# Patient Record
Sex: Female | Born: 1965 | Race: White | Hispanic: No | Smoking: Former smoker
Health system: Southern US, Community
[De-identification: ages and names within clinical notes are randomized; demographics above are authoritative.]

## PROBLEM LIST (undated history)

## (undated) DIAGNOSIS — K219 Gastro-esophageal reflux disease without esophagitis: Secondary | ICD-10-CM

## (undated) DIAGNOSIS — F1010 Alcohol abuse, uncomplicated: Secondary | ICD-10-CM

## (undated) DIAGNOSIS — D259 Leiomyoma of uterus, unspecified: Secondary | ICD-10-CM

## (undated) DIAGNOSIS — E7800 Pure hypercholesterolemia, unspecified: Secondary | ICD-10-CM

## (undated) DIAGNOSIS — K920 Hematemesis: Secondary | ICD-10-CM

## (undated) DIAGNOSIS — M179 Osteoarthritis of knee, unspecified: Secondary | ICD-10-CM

## (undated) DIAGNOSIS — E109 Type 1 diabetes mellitus without complications: Secondary | ICD-10-CM

## (undated) DIAGNOSIS — B019 Varicella without complication: Secondary | ICD-10-CM

## (undated) DIAGNOSIS — F32A Depression, unspecified: Secondary | ICD-10-CM

## (undated) DIAGNOSIS — G4730 Sleep apnea, unspecified: Secondary | ICD-10-CM

## (undated) DIAGNOSIS — I10 Essential (primary) hypertension: Secondary | ICD-10-CM

## (undated) DIAGNOSIS — R002 Palpitations: Secondary | ICD-10-CM

## (undated) DIAGNOSIS — K589 Irritable bowel syndrome without diarrhea: Secondary | ICD-10-CM

## (undated) DIAGNOSIS — M1710 Unilateral primary osteoarthritis, unspecified knee: Secondary | ICD-10-CM

## (undated) DIAGNOSIS — F329 Major depressive disorder, single episode, unspecified: Secondary | ICD-10-CM

## (undated) DIAGNOSIS — F419 Anxiety disorder, unspecified: Secondary | ICD-10-CM

## (undated) DIAGNOSIS — K5900 Constipation, unspecified: Secondary | ICD-10-CM

## (undated) DIAGNOSIS — G4700 Insomnia, unspecified: Secondary | ICD-10-CM

## (undated) DIAGNOSIS — D649 Anemia, unspecified: Secondary | ICD-10-CM

## (undated) DIAGNOSIS — K449 Diaphragmatic hernia without obstruction or gangrene: Secondary | ICD-10-CM

## (undated) DIAGNOSIS — K649 Unspecified hemorrhoids: Secondary | ICD-10-CM

## (undated) DIAGNOSIS — K3184 Gastroparesis: Secondary | ICD-10-CM

## (undated) DIAGNOSIS — T7840XA Allergy, unspecified, initial encounter: Secondary | ICD-10-CM

## (undated) DIAGNOSIS — F909 Attention-deficit hyperactivity disorder, unspecified type: Secondary | ICD-10-CM

## (undated) DIAGNOSIS — M25519 Pain in unspecified shoulder: Secondary | ICD-10-CM

## (undated) DIAGNOSIS — F429 Obsessive-compulsive disorder, unspecified: Secondary | ICD-10-CM

## (undated) DIAGNOSIS — D239 Other benign neoplasm of skin, unspecified: Secondary | ICD-10-CM

## (undated) DIAGNOSIS — F319 Bipolar disorder, unspecified: Secondary | ICD-10-CM

## (undated) DIAGNOSIS — F17200 Nicotine dependence, unspecified, uncomplicated: Secondary | ICD-10-CM

## (undated) HISTORY — DX: Anemia, unspecified: D64.9

## (undated) HISTORY — DX: Other benign neoplasm of skin, unspecified: D23.9

## (undated) HISTORY — DX: Irritable bowel syndrome, unspecified: K58.9

## (undated) HISTORY — DX: Anxiety disorder, unspecified: F41.9

## (undated) HISTORY — DX: Essential (primary) hypertension: I10

## (undated) HISTORY — DX: Sleep apnea, unspecified: G47.30

## (undated) HISTORY — PX: UPPER GASTROINTESTINAL ENDOSCOPY: SHX188

## (undated) HISTORY — DX: Diaphragmatic hernia without obstruction or gangrene: K44.9

## (undated) HISTORY — DX: Gastro-esophageal reflux disease without esophagitis: K21.9

## (undated) HISTORY — DX: Leiomyoma of uterus, unspecified: D25.9

## (undated) HISTORY — DX: Unspecified hemorrhoids: K64.9

## (undated) HISTORY — DX: Gastroparesis: K31.84

## (undated) HISTORY — DX: Hematemesis: K92.0

## (undated) HISTORY — DX: Allergy, unspecified, initial encounter: T78.40XA

## (undated) HISTORY — DX: Nicotine dependence, unspecified, uncomplicated: F17.200

## (undated) HISTORY — DX: Varicella without complication: B01.9

## (undated) HISTORY — PX: CARPAL TUNNEL RELEASE: SHX101

## (undated) HISTORY — DX: Pain in unspecified shoulder: M25.519

## (undated) HISTORY — DX: Palpitations: R00.2

## (undated) HISTORY — DX: Insomnia, unspecified: G47.00

## (undated) HISTORY — PX: COLONOSCOPY: SHX174

## (undated) HISTORY — DX: Alcohol abuse, uncomplicated: F10.10

## (undated) HISTORY — DX: Pure hypercholesterolemia, unspecified: E78.00

## (undated) HISTORY — DX: Obsessive-compulsive disorder, unspecified: F42.9

## (undated) HISTORY — DX: Attention-deficit hyperactivity disorder, unspecified type: F90.9

---

## 2000-09-15 ENCOUNTER — Encounter: Payer: Self-pay | Admitting: Emergency Medicine

## 2000-09-15 ENCOUNTER — Emergency Department (HOSPITAL_COMMUNITY): Admission: EM | Admit: 2000-09-15 | Discharge: 2000-09-15 | Payer: Self-pay | Admitting: Emergency Medicine

## 2000-09-16 ENCOUNTER — Encounter: Payer: Self-pay | Admitting: Neurology

## 2000-09-16 ENCOUNTER — Ambulatory Visit (HOSPITAL_COMMUNITY): Admission: RE | Admit: 2000-09-16 | Discharge: 2000-09-16 | Payer: Self-pay | Admitting: Neurology

## 2001-06-02 ENCOUNTER — Encounter: Payer: Self-pay | Admitting: Family Medicine

## 2001-06-02 ENCOUNTER — Encounter: Admission: RE | Admit: 2001-06-02 | Discharge: 2001-06-02 | Payer: Self-pay | Admitting: Family Medicine

## 2002-02-08 HISTORY — PX: OTHER SURGICAL HISTORY: SHX169

## 2006-02-08 DIAGNOSIS — E109 Type 1 diabetes mellitus without complications: Secondary | ICD-10-CM

## 2006-02-08 HISTORY — DX: Type 1 diabetes mellitus without complications: E10.9

## 2007-05-22 ENCOUNTER — Ambulatory Visit: Payer: Self-pay | Admitting: Family Medicine

## 2007-06-16 ENCOUNTER — Ambulatory Visit: Payer: Self-pay | Admitting: Pulmonary Disease

## 2007-06-16 DIAGNOSIS — R05 Cough: Secondary | ICD-10-CM

## 2007-06-16 DIAGNOSIS — J309 Allergic rhinitis, unspecified: Secondary | ICD-10-CM | POA: Insufficient documentation

## 2007-07-28 ENCOUNTER — Encounter: Payer: Self-pay | Admitting: Gastroenterology

## 2007-08-14 DIAGNOSIS — I10 Essential (primary) hypertension: Secondary | ICD-10-CM

## 2007-08-14 DIAGNOSIS — F418 Other specified anxiety disorders: Secondary | ICD-10-CM | POA: Insufficient documentation

## 2007-08-14 DIAGNOSIS — E1159 Type 2 diabetes mellitus with other circulatory complications: Secondary | ICD-10-CM | POA: Insufficient documentation

## 2007-08-14 DIAGNOSIS — F1010 Alcohol abuse, uncomplicated: Secondary | ICD-10-CM | POA: Insufficient documentation

## 2007-08-14 DIAGNOSIS — K219 Gastro-esophageal reflux disease without esophagitis: Secondary | ICD-10-CM

## 2007-08-14 DIAGNOSIS — F411 Generalized anxiety disorder: Secondary | ICD-10-CM

## 2007-08-14 DIAGNOSIS — F429 Obsessive-compulsive disorder, unspecified: Secondary | ICD-10-CM

## 2007-08-14 DIAGNOSIS — M25519 Pain in unspecified shoulder: Secondary | ICD-10-CM

## 2007-08-14 DIAGNOSIS — F17200 Nicotine dependence, unspecified, uncomplicated: Secondary | ICD-10-CM | POA: Insufficient documentation

## 2007-08-14 DIAGNOSIS — E7800 Pure hypercholesterolemia, unspecified: Secondary | ICD-10-CM | POA: Insufficient documentation

## 2007-08-15 ENCOUNTER — Ambulatory Visit: Payer: Self-pay | Admitting: Gastroenterology

## 2007-08-15 DIAGNOSIS — K3184 Gastroparesis: Secondary | ICD-10-CM | POA: Insufficient documentation

## 2007-08-15 DIAGNOSIS — K589 Irritable bowel syndrome without diarrhea: Secondary | ICD-10-CM | POA: Insufficient documentation

## 2007-12-07 ENCOUNTER — Ambulatory Visit: Payer: Self-pay | Admitting: Family Medicine

## 2008-07-12 ENCOUNTER — Ambulatory Visit: Payer: Self-pay | Admitting: Family Medicine

## 2008-07-16 ENCOUNTER — Ambulatory Visit (HOSPITAL_BASED_OUTPATIENT_CLINIC_OR_DEPARTMENT_OTHER): Admission: RE | Admit: 2008-07-16 | Discharge: 2008-07-16 | Payer: Self-pay | Admitting: Family Medicine

## 2008-07-21 ENCOUNTER — Ambulatory Visit: Payer: Self-pay | Admitting: Internal Medicine

## 2008-11-01 ENCOUNTER — Encounter: Admission: RE | Admit: 2008-11-01 | Discharge: 2008-11-01 | Payer: Self-pay | Admitting: Family Medicine

## 2009-05-17 ENCOUNTER — Encounter: Admission: RE | Admit: 2009-05-17 | Discharge: 2009-05-17 | Payer: Self-pay | Admitting: Obstetrics and Gynecology

## 2009-06-23 ENCOUNTER — Encounter: Admission: RE | Admit: 2009-06-23 | Discharge: 2009-06-23 | Payer: Self-pay | Admitting: Family Medicine

## 2009-06-26 ENCOUNTER — Encounter: Admission: RE | Admit: 2009-06-26 | Discharge: 2009-06-26 | Payer: Self-pay | Admitting: Family Medicine

## 2009-07-09 HISTORY — PX: VAGINAL HYSTERECTOMY: SUR661

## 2009-07-10 ENCOUNTER — Ambulatory Visit (HOSPITAL_COMMUNITY): Admission: RE | Admit: 2009-07-10 | Discharge: 2009-07-11 | Payer: Self-pay | Admitting: Obstetrics and Gynecology

## 2009-07-10 ENCOUNTER — Encounter (INDEPENDENT_AMBULATORY_CARE_PROVIDER_SITE_OTHER): Payer: Self-pay | Admitting: Obstetrics and Gynecology

## 2010-02-08 LAB — HM COLONOSCOPY

## 2010-03-01 ENCOUNTER — Encounter: Payer: Self-pay | Admitting: Family Medicine

## 2010-03-02 ENCOUNTER — Encounter: Payer: Self-pay | Admitting: Gastroenterology

## 2010-03-20 ENCOUNTER — Encounter: Payer: Self-pay | Admitting: Gastroenterology

## 2010-03-27 DIAGNOSIS — D259 Leiomyoma of uterus, unspecified: Secondary | ICD-10-CM | POA: Insufficient documentation

## 2010-03-27 DIAGNOSIS — D235 Other benign neoplasm of skin of trunk: Secondary | ICD-10-CM | POA: Insufficient documentation

## 2010-03-27 DIAGNOSIS — G4730 Sleep apnea, unspecified: Secondary | ICD-10-CM | POA: Insufficient documentation

## 2010-03-27 DIAGNOSIS — F1010 Alcohol abuse, uncomplicated: Secondary | ICD-10-CM | POA: Insufficient documentation

## 2010-03-27 DIAGNOSIS — D509 Iron deficiency anemia, unspecified: Secondary | ICD-10-CM | POA: Insufficient documentation

## 2010-03-31 ENCOUNTER — Telehealth: Payer: Self-pay | Admitting: Gastroenterology

## 2010-03-31 ENCOUNTER — Ambulatory Visit (INDEPENDENT_AMBULATORY_CARE_PROVIDER_SITE_OTHER): Payer: Self-pay | Admitting: Gastroenterology

## 2010-03-31 ENCOUNTER — Encounter: Payer: Self-pay | Admitting: Gastroenterology

## 2010-03-31 ENCOUNTER — Other Ambulatory Visit: Payer: Self-pay

## 2010-03-31 ENCOUNTER — Encounter (INDEPENDENT_AMBULATORY_CARE_PROVIDER_SITE_OTHER): Payer: Self-pay | Admitting: *Deleted

## 2010-03-31 ENCOUNTER — Other Ambulatory Visit: Payer: Self-pay | Admitting: Gastroenterology

## 2010-03-31 DIAGNOSIS — E119 Type 2 diabetes mellitus without complications: Secondary | ICD-10-CM

## 2010-03-31 DIAGNOSIS — K219 Gastro-esophageal reflux disease without esophagitis: Secondary | ICD-10-CM

## 2010-03-31 DIAGNOSIS — K3184 Gastroparesis: Secondary | ICD-10-CM

## 2010-03-31 DIAGNOSIS — K5900 Constipation, unspecified: Secondary | ICD-10-CM

## 2010-03-31 DIAGNOSIS — E109 Type 1 diabetes mellitus without complications: Secondary | ICD-10-CM

## 2010-03-31 DIAGNOSIS — K589 Irritable bowel syndrome without diarrhea: Secondary | ICD-10-CM

## 2010-03-31 LAB — CBC WITH DIFFERENTIAL/PLATELET
Basophils Absolute: 0 10*3/uL (ref 0.0–0.1)
Monocytes Absolute: 0.5 10*3/uL (ref 0.1–1.0)
Neutrophils Relative %: 71 % (ref 43.0–77.0)
RBC: 4.68 Mil/uL (ref 3.87–5.11)
WBC: 8.9 10*3/uL (ref 4.5–10.5)

## 2010-04-01 LAB — LIPASE: Lipase: 19 U/L (ref 11.0–59.0)

## 2010-04-01 LAB — AMYLASE: Amylase: 70 U/L (ref 27–131)

## 2010-04-01 LAB — IGA: IgA: 205 mg/dL (ref 68–378)

## 2010-04-07 NOTE — Progress Notes (Signed)
Summary: Wrong Rx  Phone Note Call from Patient Call back at Home Phone [phone] [facility]     Call For: Dr Jarold Motto Summary of Call: We sent script to [facility] pharmacy. [facility] [address] [facility] there. Adviced her nurse is at lunch ad will take care as soon as she gets back. Initial call taken by: Leanor Kail [facility],  March 31, 2010 1:14 PM  Follow-up for Phone Call        resent rx to new pharm. unble to contact pt at the number listed. Follow-up by: Harlow Mares CMA Duncan Dull),  March 31, 2010 1:22 PM    Prescriptions: MOVIPREP 100 GM  SOLR (PEG-KCL-NACL-NASULF-NA ASC-C) As per prep instructions.  #1 x 0   Entered by:   Harlow Mares CMA (AAMA)   Authorized by:   Mardella Layman MD [facility]   Signed by:   Harlow Mares CMA (AAMA) on 03/31/2010   Method used:   [facility] [phone]* (retail)       [address]       [place], [place]  [address]       Ph: [phone] or [phone]       Fax: [phone]   RxID:   [id number] CARAFATE 1 GM/10ML SUSP (SUCRALFATE) take 10 ml 20 min after meals and at bedtime  #1 month x 3   Entered by:   Harlow Mares CMA (AAMA)   Authorized by:   Mardella Layman MD [facility]   Signed by:   Harlow Mares CMA (AAMA) on 03/31/2010   Method used:   [facility] [phone]* (retail)       [address]       [place], [place]  [address]       Ph: [phone] or [phone]       Fax: [phone]   RxID:   [id number]

## 2010-04-07 NOTE — Letter (Signed)
Summary: [facility]   Imported By: Lester [place] 04/02/2010 09:29:50  _____________________________________________________________________  External Attachment:    Type:   Image     Comment:   External Document

## 2010-04-07 NOTE — Letter (Signed)
Summary: Diabetic Instructions  [facility]  [address] [place], [place] [address]   Phone: [phone]  Fax: [phone]    CHARITY TESSIER 1965-09-04 MRN: [id number]   X   INSULIN PUMP MEDICATION INSTRUCTIONS  We will contact the physician managing your diabetic care for written dosage instructions for the day before your procedure and the day of your procedure.  Once we have received the instructions, we will contact you.

## 2010-04-07 NOTE — Letter (Signed)
Summary: [facility] Instructions  [facility]  [address] [place], [place] [address]   Phone: [phone]  Fax: [phone]       Karla Rodriguez    05-Nov-1965    MRN: [id number]        Procedure Day Dorna Bloom: Wednesday 05/06/2010     Arrival Time: 9:30am     Procedure Time: 10:30am     Location of Procedure:                    X  [facility] (4th Floor)                        PREPARATION FOR COLONOSCOPY WITH MOVIPREP   Starting 5 days prior to your procedure 05/01/2010 do not eat nuts, seeds, popcorn, corn, beans, peas,  salads, or any raw vegetables.  Do not take any fiber supplements (e.g. Metamucil, Citrucel, and Benefiber).  THE DAY BEFORE YOUR PROCEDURE         Tuesday 05/05/2010  1.  Drink clear liquids the entire day-NO SOLID FOOD  2.  Do not drink anything colored red or purple.  Avoid juices with pulp.  No orange juice.  3.  Drink at least 64 oz. (8 glasses) of fluid/clear liquids during the day to prevent dehydration and help the prep work efficiently.  CLEAR LIQUIDS INCLUDE: Water Jello Ice Popsicles Tea (sugar ok, no milk/cream) Powdered fruit flavored drinks Coffee (sugar ok, no milk/cream) Gatorade Juice: apple, white grape, white cranberry  Lemonade Clear bullion, consomm, broth Carbonated beverages (any kind) Strained chicken noodle soup Hard Candy                             4.  In the morning, mix first dose of MoviPrep solution:    Empty 1 Pouch A and 1 Pouch B into the disposable container    Add lukewarm drinking water to the top line of the container. Mix to dissolve    Refrigerate (mixed solution should be used within 24 hrs)  5.  Begin drinking the prep at 5:00 p.m. The MoviPrep container is divided by 4 marks.   Every 15 minutes drink the solution down to the next mark (approximately 8 oz) until the full liter is complete.   6.  Follow completed prep with 16 oz of clear liquid of your choice (Nothing red or  purple).  Continue to drink clear liquids until bedtime.  7.  Before going to bed, mix second dose of MoviPrep solution:    Empty 1 Pouch A and 1 Pouch B into the disposable container    Add lukewarm drinking water to the top line of the container. Mix to dissolve    Refrigerate  THE DAY OF YOUR PROCEDURE      Tuesday 05/06/2010  Beginning at 5:30am (5 hours before procedure):         1. Every 15 minutes, drink the solution down to the next mark (approx 8 oz) until the full liter is complete.  2. Follow completed prep with 16 oz. of clear liquid of your choice.    3. You may drink clear liquids until 8:30am (2 HOURS BEFORE PROCEDURE).   MEDICATION INSTRUCTIONS  Unless otherwise instructed, you should take regular prescription medications with a small sip of water   as early as possible the morning of your procedure.  Diabetic patients - see separate instructions.  OTHER INSTRUCTIONS  You will need a responsible adult at least 43 years of age to accompany you and drive you home.   This person must remain in the waiting room during your procedure.  Wear loose fitting clothing that is easily removed.  Leave jewelry and other valuables at home.  However, you may wish to bring a book to read or  an iPod/MP3 player to listen to music as you wait for your procedure to start.  Remove all body piercing jewelry and leave at home.  Total time from sign-in until discharge is approximately 2-3 hours.  You should go home directly after your procedure and rest.  You can resume normal activities the  day after your procedure.  The day of your procedure you should not:   Drive   Make legal decisions   Operate machinery   Drink alcohol   Return to work  You will receive specific instructions about eating, activities and medications before you leave.    The above instructions have been reviewed and explained to me by   _______________________    I fully  understand and can verbalize these instructions _____________________________ Date _________
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
## 2010-04-09 ENCOUNTER — Ambulatory Visit (HOSPITAL_COMMUNITY)
Admission: RE | Admit: 2010-04-09 | Discharge: 2010-04-09 | Disposition: A | Discharge status: Home / Self Care | Payer: Managed Care, Other (non HMO) | Source: Ambulatory Visit | Attending: Gastroenterology | Admitting: Gastroenterology

## 2010-04-09 ENCOUNTER — Encounter: Payer: Self-pay | Admitting: Gastroenterology

## 2010-04-09 DIAGNOSIS — R141 Gas pain: Secondary | ICD-10-CM | POA: Insufficient documentation

## 2010-04-09 DIAGNOSIS — R110 Nausea: Secondary | ICD-10-CM | POA: Insufficient documentation

## 2010-04-09 DIAGNOSIS — K219 Gastro-esophageal reflux disease without esophagitis: Secondary | ICD-10-CM | POA: Insufficient documentation

## 2010-04-09 DIAGNOSIS — K3184 Gastroparesis: Secondary | ICD-10-CM | POA: Insufficient documentation

## 2010-04-09 DIAGNOSIS — K589 Irritable bowel syndrome without diarrhea: Secondary | ICD-10-CM | POA: Insufficient documentation

## 2010-04-09 DIAGNOSIS — K7689 Other specified diseases of liver: Secondary | ICD-10-CM | POA: Insufficient documentation

## 2010-04-09 DIAGNOSIS — R142 Eructation: Secondary | ICD-10-CM | POA: Insufficient documentation

## 2010-04-09 DIAGNOSIS — E109 Type 1 diabetes mellitus without complications: Secondary | ICD-10-CM | POA: Insufficient documentation

## 2010-04-27 LAB — BASIC METABOLIC PANEL
BUN: 3 mg/dL — ABNORMAL LOW (ref 6–23)
BUN: 8 mg/dL (ref 6–23)
CO2: 26 mEq/L (ref 19–32)
CO2: 27 mEq/L (ref 19–32)
Calcium: 9 mg/dL (ref 8.4–10.5)
Chloride: 104 mEq/L (ref 96–112)
Chloride: 106 mEq/L (ref 96–112)
Creatinine, Ser: 0.53 mg/dL (ref 0.4–1.2)
Glucose, Bld: 135 mg/dL — ABNORMAL HIGH (ref 70–99)
Potassium: 3.6 mEq/L (ref 3.5–5.1)
Potassium: 4.1 mEq/L (ref 3.5–5.1)

## 2010-04-27 LAB — GLUCOSE, CAPILLARY
Glucose-Capillary: 140 mg/dL — ABNORMAL HIGH (ref 70–99)
Glucose-Capillary: 143 mg/dL — ABNORMAL HIGH (ref 70–99)
Glucose-Capillary: 147 mg/dL — ABNORMAL HIGH (ref 70–99)
Glucose-Capillary: 156 mg/dL — ABNORMAL HIGH (ref 70–99)
Glucose-Capillary: 173 mg/dL — ABNORMAL HIGH (ref 70–99)
Glucose-Capillary: 175 mg/dL — ABNORMAL HIGH (ref 70–99)
Glucose-Capillary: 196 mg/dL — ABNORMAL HIGH (ref 70–99)
Glucose-Capillary: 257 mg/dL — ABNORMAL HIGH (ref 70–99)

## 2010-04-27 LAB — CBC
HCT: 33.3 % — ABNORMAL LOW (ref 36.0–46.0)
MCHC: 34.6 g/dL (ref 30.0–36.0)
MCHC: 34.8 g/dL (ref 30.0–36.0)
MCV: 87.4 fL (ref 78.0–100.0)
Platelets: 170 10*3/uL (ref 150–400)
RBC: 3.81 MIL/uL — ABNORMAL LOW (ref 3.87–5.11)
WBC: 10.4 10*3/uL (ref 4.0–10.5)
WBC: 7.8 10*3/uL (ref 4.0–10.5)

## 2010-05-05 ENCOUNTER — Encounter: Payer: Self-pay | Admitting: Gastroenterology

## 2010-05-06 ENCOUNTER — Encounter: Payer: Self-pay | Admitting: Gastroenterology

## 2010-05-06 ENCOUNTER — Ambulatory Visit (AMBULATORY_SURGERY_CENTER): Payer: Managed Care, Other (non HMO) | Admitting: Gastroenterology

## 2010-05-06 ENCOUNTER — Encounter: Payer: Self-pay | Admitting: *Deleted

## 2010-05-06 VITALS — BP 105/53 | HR 67 | Temp 98.0°F | Resp 18 | Ht 64.0 in | Wt 200.0 lb

## 2010-05-06 DIAGNOSIS — K3184 Gastroparesis: Secondary | ICD-10-CM

## 2010-05-06 DIAGNOSIS — K449 Diaphragmatic hernia without obstruction or gangrene: Secondary | ICD-10-CM

## 2010-05-06 DIAGNOSIS — K620 Anal polyp: Secondary | ICD-10-CM

## 2010-05-06 DIAGNOSIS — E1343 Other specified diabetes mellitus with diabetic autonomic (poly)neuropathy: Secondary | ICD-10-CM

## 2010-05-06 DIAGNOSIS — K6389 Other specified diseases of intestine: Secondary | ICD-10-CM

## 2010-05-06 DIAGNOSIS — K5904 Chronic idiopathic constipation: Secondary | ICD-10-CM

## 2010-05-06 DIAGNOSIS — K635 Polyp of colon: Secondary | ICD-10-CM

## 2010-05-06 DIAGNOSIS — R6889 Other general symptoms and signs: Secondary | ICD-10-CM

## 2010-05-06 DIAGNOSIS — Z1211 Encounter for screening for malignant neoplasm of colon: Secondary | ICD-10-CM

## 2010-05-06 DIAGNOSIS — K219 Gastro-esophageal reflux disease without esophagitis: Secondary | ICD-10-CM

## 2010-05-06 DIAGNOSIS — R112 Nausea with vomiting, unspecified: Secondary | ICD-10-CM

## 2010-05-06 LAB — GLUCOSE, CAPILLARY

## 2010-05-06 NOTE — Telephone Encounter (Signed)
Error

## 2010-05-06 NOTE — Patient Instructions (Signed)
Instructions given with verbal understanding. Handouts on polyps and a hiatal hernia given. Reminder that a follow up appointment for two weeks.

## 2010-05-07 ENCOUNTER — Telehealth: Payer: Self-pay | Admitting: *Deleted

## 2010-05-07 DIAGNOSIS — K3184 Gastroparesis: Secondary | ICD-10-CM

## 2010-05-07 DIAGNOSIS — K219 Gastro-esophageal reflux disease without esophagitis: Secondary | ICD-10-CM

## 2010-05-07 DIAGNOSIS — K589 Irritable bowel syndrome without diarrhea: Secondary | ICD-10-CM

## 2010-05-07 LAB — HELICOBACTER PYLORI SCREEN-BIOPSY: UREASE: NEGATIVE

## 2010-05-07 NOTE — Telephone Encounter (Signed)
Follow up Call- Patient questions:  Do you have a fever, pain , or abdominal swelling? no Pain Score  0 *  Have you tolerated food without any problems? yes  Have you been able to return to your normal activities? no  Do you have any questions about your discharge instructions: Diet   no Medications  no Follow up visit  no  Do you have questions or concerns about your Care? no  Actions: * If pain score is 4 or above: No action needed, pain <4.

## 2010-05-12 ENCOUNTER — Encounter: Payer: Self-pay | Admitting: Gastroenterology

## 2010-05-12 NOTE — Procedures (Signed)
Summary: Upper Endoscopy  Patient: Karla Rodriguez Note: All result statuses are Final unless otherwise noted.  Tests: (1) Upper Endoscopy (EGD)   EGD Upper Endoscopy       DONE     [facility]     [address]     [place], [place]  [address]          ENDOSCOPY PROCEDURE REPORT          PATIENT:  [name], [name]  MR#:  [id number]     BIRTHDATE:  1965/09/07, 44 yrs. old  GENDER:  female          ENDOSCOPIST:  Vania Rea. Jarold Motto, MD, [facility]     Referred by:          PROCEDURE DATE:  05/06/2010     PROCEDURE:  EGD with biopsy for H. pylori [address]     ASA CLASS:  Class III     INDICATIONS:  N AND V.DIABETIC GASTROPARESIS.HX OF HEMATEMESIS.          MEDICATIONS:   There was residual sedation effect present from     prior procedure., Versed 3 mg IV     TOPICAL ANESTHETIC:          DESCRIPTION OF PROCEDURE:   After the risks benefits and     alternatives of the procedure were thoroughly explained, informed     consent was obtained.  The  endoscope was introduced through the     mouth and advanced to the second portion of the duodenum, limited     by retching and gagging.   The instrument was slowly withdrawn as     the mucosa was fully examined.     <<PROCEDUREIMAGES>>          A hiatal hernia was found. LARGE PROLAPSING 6 CM. HH NOTED AND     FREE REFLUX.  The stomach was entered and closely examined. The     antrum, angularis, and lesser curvature were well visualized,     including a retroflexed view of the cardia and fundus. The stomach     wall was normally distensable. The scope passed easily through the     pylorus into the duodenum. NO RETAINED FOOD,.CLO BX. DONE.  normal     ampulla. SI BX. DONE.  The esophagus and gastroesophageal junction     were completely normal in appearance.    Retroflexed views     revealed a hiatal hernia.    The scope was then withdrawn from the     patient and the procedure completed.          COMPLICATIONS:  None       ENDOSCOPIC IMPRESSION:     1) Hiatal hernia     2) Normal stomach     3) Normal ampulla     4) Normal esophagus     5) A hiatal hernia     1.PROBABLE ELEMENT OF DELAYED GASTRIC EMPTYING FROM DIABETES.     2.R/O CELIAC DISEASE     3.GERD ON RX.R/O H.PYLORI     RECOMMENDATIONS:     1) Await pathology results     2) OP follow-up in 2 weeks.     3) Rx CLO if positive     4) continue current medications     CONTINUE GASTROPARESIS DIET          REPEAT EXAM:  No          ______________________________     Onalee Hua  Hale Bogus, MD, [facility]          CC:          n.     eSIGNED:   Vania Rea. Patterson at 05/06/2010 11:06 AM          Risa Grill, [phone]  Note: An exclamation mark (!) indicates a result that was not dispersed into the flowsheet. Document Creation Date: 05/06/2010 11:07 AM _______________________________________________________________________  (1) Order result status: Final Collection or observation date-time: 05/06/2010 10:55 Requested date-time:  Receipt date-time:  Reported date-time:  Referring Physician:   Ordering Physician: Sheryn Bison [phone]) Specimen Source:  Source: Launa Grill Order Number: [id number] Lab site:

## 2010-05-12 NOTE — Procedures (Signed)
Summary: Colonoscopy  Patient: Karla Rodriguez Note: All result statuses are Final unless otherwise noted.  Tests: (1) Colonoscopy (COL)   COL Colonoscopy           DONE     [facility]     [address]     [place], [place]  [address]          COLONOSCOPY PROCEDURE REPORT          PATIENT:  Karla, Rodriguez  MR#:  [id number]     BIRTHDATE:  11/10/1965, 44 yrs. old  GENDER:  female     ENDOSCOPIST:  Vania Rea. Jarold Motto, MD, [facility]     REF. BY:     PROCEDURE DATE:  05/06/2010     PROCEDURE:  Colonoscopy with biopsy     ASA CLASS:  Class III     INDICATIONS:  Routine Risk Screening     MEDICATIONS:   Fentanyl 100 mcg IV, Versed 9 mg IV          DESCRIPTION OF PROCEDURE:   After the risks benefits and     alternatives of the procedure were thoroughly explained, informed     consent was obtained.  Digital rectal exam was performed and     revealed no abnormalities.   The  endoscope was introduced through     the anus and advanced to the cecum, which was identified by both     the appendix and ileocecal valve, without limitations.  The     quality of the prep was good, using MoviPrep.  The instrument was     then slowly withdrawn as the colon was fully examined.     <<PROCEDUREIMAGES>>          FINDINGS:  Melanosis coli was found. sigmoid bx. done.  There were     multiple polyps identified and removed. in the rectum. small 2mm     polyps cold snare excised.  This was otherwise a normal     examination of the colon.   Retroflexed views in the rectum     revealed no abnormalities.    The scope was then withdrawn from     the patient and the procedure completed.          COMPLICATIONS:  None     ENDOSCOPIC IMPRESSION:     1) Melanosis     2) Polyps, multiple in the rectum     3) Otherwise normal examination     R/O ADENOMAS.FINDINGS C/W CHRONIC CONSTIPATION.     RECOMMENDATIONS:     1) high fiber diet     2) Await biopsy results     3) Repeat colonoscopy in 5 years if  polyp adenomatous; otherwise     10 years     4) metamucil or benefiber     REPEAT EXAM:  No          ______________________________     Vania Rea. Jarold Motto, MD, [facility]          CC:          n.     eSIGNED:   Vania Rea. Jontavious Commons at 05/06/2010 10:50 AM          Risa Grill, [id number]  Note: An exclamation mark (!) indicates a result that was not dispersed into the flowsheet. Document Creation Date: 05/06/2010 10:50 AM _______________________________________________________________________  (1) Order result status: Final Collection or observation date-time: 05/06/2010 10:43 Requested date-time:  Receipt date-time:  Reported date-time:  Referring  Physician:   Ordering Physician: Sheryn Bison [phone]) Specimen Source:  Source: Launa Grill Order Number: [id number] Lab site:

## 2010-05-12 NOTE — Telephone Encounter (Signed)
Unable to reach pt to schedule her for a f/u appt post 05/06/10 ECL.Mailed pt a letter.  [facility]  [address] [place], [place] [address]  Phone: [phone]  Fax: [phone]       May 12, 2010 MRN: [id number]    [facility] [address] [place], [place]  [address]    Dear Ms. Henandez,   We have been unable to reach you by phone to schedule a follow up   appointment that was recommended for you by Dr.Patterson. It is very   important that we reach you to schedule an appointment. We hope that you  allow [place] to participate in your health care needs. Please contact us at  [phone] at your earliest convenience to schedule your appointment.     Sincerely,    Graciella Freer RN   Signed by Graciella Freer RN on 05/12/2010 at 10:41 AM  ________________________________________________________________________

## 2010-05-19 ENCOUNTER — Ambulatory Visit (INDEPENDENT_AMBULATORY_CARE_PROVIDER_SITE_OTHER): Payer: Managed Care, Other (non HMO) | Admitting: Gastroenterology

## 2010-05-19 ENCOUNTER — Encounter: Payer: Self-pay | Admitting: Gastroenterology

## 2010-05-19 VITALS — BP 128/64 | HR 80 | Ht 64.0 in | Wt 202.0 lb

## 2010-05-19 DIAGNOSIS — K5900 Constipation, unspecified: Secondary | ICD-10-CM

## 2010-05-19 DIAGNOSIS — E119 Type 2 diabetes mellitus without complications: Secondary | ICD-10-CM

## 2010-05-19 DIAGNOSIS — K219 Gastro-esophageal reflux disease without esophagitis: Secondary | ICD-10-CM

## 2010-05-19 DIAGNOSIS — D1803 Hemangioma of intra-abdominal structures: Secondary | ICD-10-CM

## 2010-05-19 DIAGNOSIS — K3184 Gastroparesis: Secondary | ICD-10-CM

## 2010-05-19 NOTE — Patient Instructions (Addendum)
Your Gastric Empty Scan is scheduled at [facility] on 05/28/2010 @ 8:00am, please arrive 15 min early and stop taking your Nexium and Carafate on 05/27/2010 You can restart after your scan. Do not have anything to eat or drink after midnight.

## 2010-05-19 NOTE — Progress Notes (Signed)
This is a 45 year old Caucasian female with insulin-dependent diabetes who recently underwent endoscopy and colonoscopy which were unremarkable except for hiatal hernia and changes of melanosis coli. She currently is asymptomatic except for early satiety, gas, bloating, and reflux postprandially. She is on Nexium 40 mg a day and when necessary Carafate area and she denies true dysphagia, nausea vomiting, or other systemic complaints. Her constipation seems to be improved . Upper abdominal ultrasound exam was reviewed and was unremarkable save a small hepatic hemangioma. Also review her labs shows no specific abnormality. Small bowel biopsy showed no evidence of celiac disease. Colon biopsies confirm melanosis coli.  Current Medications, Allergies, Past Medical History, Past Surgical History, Family History and Social History were reviewed in [employer] record.  Pertinent Review of Systems Negative   Physical Exam: Awake alert no acute distress appears stated age. I cannot appreciate stigmata of chronic liver disease. Abdominal exam shows no organomegaly, masses, tenderness, and bowel sounds are normal with a negative succussion splash.    Assessment and Plan: Probable diabetic gastroparesis. I have scheduled her for a gastric emptying scan and if this is abnormal we will start domperidone 10 mg before meals and at bedtime as tolerated. I have reviewed a gastroparesis diet with her today, and we'll continue the antireflux regime and daily PPI therapy. Encounter Diagnosis  Name Primary?  . Gastroparesis Yes

## 2010-05-28 ENCOUNTER — Encounter (HOSPITAL_COMMUNITY)
Admission: RE | Admit: 2010-05-28 | Discharge: 2010-05-28 | Disposition: A | Discharge status: Home / Self Care | Payer: Managed Care, Other (non HMO) | Source: Ambulatory Visit | Attending: Gastroenterology | Admitting: Gastroenterology

## 2010-05-28 DIAGNOSIS — R110 Nausea: Secondary | ICD-10-CM | POA: Insufficient documentation

## 2010-05-28 DIAGNOSIS — R141 Gas pain: Secondary | ICD-10-CM | POA: Insufficient documentation

## 2010-05-28 DIAGNOSIS — R142 Eructation: Secondary | ICD-10-CM | POA: Insufficient documentation

## 2010-05-28 DIAGNOSIS — K3184 Gastroparesis: Secondary | ICD-10-CM

## 2010-05-28 MED ORDER — TECHNETIUM TC 99M SULFUR COLLOID
2.0000 | Freq: Once | INTRAVENOUS | Status: AC | PRN
  Administered 2010-05-28: 2 via ORAL

## 2010-06-23 NOTE — Procedures (Signed)
NAME:  Karla Rodriguez, Karla Rodriguez                 ACCOUNT NO.:  [id number]   MEDICAL RECORD NO.:  [id number]          PATIENT TYPE:  OUT   LOCATION:  SLEEP CENTER                 FACILITY:  [facility]   PHYSICIAN:  Clinton D. Maple Hudson, MD, FCCP, FACPDATE OF BIRTH:  03-19-67   DATE OF STUDY:  07/16/2008                            NOCTURNAL POLYSOMNOGRAM   REFERRING PHYSICIAN:   REFERRING PRACTITIONER:  Nilda Simmer, MD   INDICATION FOR STUDY:  Hypersomnia with sleep apnea.   EPWORTH SLEEPINESS SCORE:  11/24, BMI 34.3, weight 200 pounds, height 64  inches, neck 14.5 inches.   MEDICATIONS:  Home medications, charted and reviewed.   SLEEP ARCHITECTURE:  Split study protocol.  During the diagnostic phase,  total sleep time was 171 minutes with sleep efficiency 66.4%.  Stage I  was 13.5%, stage II 86.5%, stages III and REM were absent.  Sleep  latency 30 minutes, awake after sleep onset 50 minutes, arousal index  66.7 indicating increased EEG arousal.  Bedtime medication included  Lexapro, Zyrtec, Colace, Singulair, Xanax, Avelox, Benadryl, Crestor,  and benzonatate.  Tussionex was taken at midnight for coughing.   RESPIRATORY DATA:  Split study protocol.  Apnea-hypopnea index (AHI)  16.1.  A total of 46 events were scored including 4 obstructive apneas  and 42 hypopneas.  Events were not positional.  CPAP was then titrated  to 10 CWP, AHI 0.  She wore a small ResMed Mirage Quattro full face mask  with heated humidifier.   OXYGEN DATA:  Extremely loud snoring before CPAP with oxygen  desaturation to a nadir of 86%.  After CPAP titration, mean oxygen  saturation held 94.3% on room air.   CARDIAC DATA:  Sinus rhythm with frequent PVCs including some bigeminy.   MOVEMENT-PARASOMNIA:  No significant movement disturbance.  Bathroom x1.   IMPRESSIONS-RECOMMENDATIONS:  1. Moderate obstructive sleep apnea/hypopnea syndrome, apnea-hypopnea      index 16.1 per hour with very loud snoring, oxygen desaturation  to      a nadir of 86%, and non positional events.  2. Successful CPAP titration to 10 centimeters of water pressure,      apnea-hypopnea index 0 per hour.  She wore a small ResMed Mirage      Quattro full face mask with heated humidifier.      Clinton D. Maple Hudson, MD, [facility], FACP  Diplomate, Biomedical engineer of Sleep Medicine  Electronically Signed     CDY/MEDQ  D:  07/21/2008 15:07:21  T:  07/22/2008 00:25:00  Job:  [id number]

## 2011-04-12 ENCOUNTER — Ambulatory Visit: Payer: Self-pay | Admitting: Family Medicine

## 2011-04-27 ENCOUNTER — Encounter (HOSPITAL_BASED_OUTPATIENT_CLINIC_OR_DEPARTMENT_OTHER): Payer: Self-pay | Admitting: *Deleted

## 2011-04-27 ENCOUNTER — Other Ambulatory Visit: Payer: Self-pay

## 2011-04-27 ENCOUNTER — Other Ambulatory Visit: Payer: Self-pay | Admitting: Orthopedic Surgery

## 2011-04-27 NOTE — H&P (Signed)
  HPI: Patient presents with a chief complaint of left calf pain.  Her symptoms began acutely on 03/13/11 when she was lifting a resident.  She works as a Lawyer at a nursing home.  She reports that she felt a pop in her calf at that time.  She was diagnosed with a soleus strain and has recently started physical therapy.  She has been to 2 sessions so far.  She has been doing light duty work in medical records and seems to tolerate it well.  She takes no pain medications.  All: Cipro  ROS: 14 point review of systems form filled out by the patient was reviewed and was negative as it relates to the history of present illness except for: None  PMH: Diabetes and high cholesterol.  Past surgical history is significant for hysterectomy.  FHx: Diabetes and heart disease  SocHx:.  She smokes one pack of cigarettes per day  PE: Well-developed, well-nourished 46 year old female who is alert and oriented in no acute distress.  She is 6 feet 1 inch tall and weighs 320 pounds.  Examination of the left calf demonstrates tenderness to palpation at the musculotendinous junction most notably on the medial side of the gastroc.  Pain with resisted dorsiflexion and plantar flexion.  No pain with inversion or eversion of the ankle.  Normal Thompson's test.  Negative Homans sign.  Neurovascular exam is within normal limits.  Imaging/Tests: 2 views of the tib-fib demonstrate no obvious bony abnormalities  Asses: Left calf strain   Plan: We would like Ms. Council to continue her physical therapy.  We have given her prescriptions for meloxicam and Norco to use for pain.  Her work note reflects a 15 pound maximum lift with 50% seated work.  Followup will be in 3 weeks for reevaluation.  She was seen in consultation with her rehabilitation nurse Lennart Pall.   Electronically verified by:          Charisse Klinefelter. Mayford Knife, PA-C Feliberto Gottron. Turner Daniels, MD  CC: Worker's Compensation and Lennart Pall

## 2011-04-27 NOTE — Progress Notes (Signed)
Pt came fro dr Turner Daniels office-insulin pump-had not eaten-hypoglycemic-took glucose pill-cindy did ekg-will do istat in am preop. Bring meds-inhalers.

## 2011-04-28 ENCOUNTER — Encounter (HOSPITAL_BASED_OUTPATIENT_CLINIC_OR_DEPARTMENT_OTHER): Payer: Self-pay | Admitting: Anesthesiology

## 2011-04-28 ENCOUNTER — Encounter (HOSPITAL_BASED_OUTPATIENT_CLINIC_OR_DEPARTMENT_OTHER): Payer: Self-pay | Admitting: Certified Registered Nurse Anesthetist

## 2011-04-28 ENCOUNTER — Ambulatory Visit (HOSPITAL_BASED_OUTPATIENT_CLINIC_OR_DEPARTMENT_OTHER): Payer: Managed Care, Other (non HMO) | Admitting: Anesthesiology

## 2011-04-28 ENCOUNTER — Encounter (HOSPITAL_BASED_OUTPATIENT_CLINIC_OR_DEPARTMENT_OTHER)
Admission: RE | Disposition: A | Discharge status: Home / Self Care | Payer: Self-pay | Source: Ambulatory Visit | Attending: Orthopedic Surgery

## 2011-04-28 ENCOUNTER — Encounter (HOSPITAL_BASED_OUTPATIENT_CLINIC_OR_DEPARTMENT_OTHER): Payer: Self-pay | Admitting: *Deleted

## 2011-04-28 ENCOUNTER — Ambulatory Visit (HOSPITAL_BASED_OUTPATIENT_CLINIC_OR_DEPARTMENT_OTHER)
Admission: RE | Admit: 2011-04-28 | Discharge: 2011-04-28 | Disposition: A | Discharge status: Home / Self Care | Payer: Managed Care, Other (non HMO) | Source: Ambulatory Visit | Attending: Orthopedic Surgery | Admitting: Orthopedic Surgery

## 2011-04-28 DIAGNOSIS — M23302 Other meniscus derangements, unspecified lateral meniscus, unspecified knee: Secondary | ICD-10-CM | POA: Insufficient documentation

## 2011-04-28 DIAGNOSIS — E119 Type 2 diabetes mellitus without complications: Secondary | ICD-10-CM | POA: Insufficient documentation

## 2011-04-28 DIAGNOSIS — F411 Generalized anxiety disorder: Secondary | ICD-10-CM | POA: Insufficient documentation

## 2011-04-28 DIAGNOSIS — F1010 Alcohol abuse, uncomplicated: Secondary | ICD-10-CM | POA: Insufficient documentation

## 2011-04-28 DIAGNOSIS — M2342 Loose body in knee, left knee: Secondary | ICD-10-CM

## 2011-04-28 DIAGNOSIS — K449 Diaphragmatic hernia without obstruction or gangrene: Secondary | ICD-10-CM | POA: Insufficient documentation

## 2011-04-28 DIAGNOSIS — M2240 Chondromalacia patellae, unspecified knee: Secondary | ICD-10-CM | POA: Insufficient documentation

## 2011-04-28 DIAGNOSIS — I10 Essential (primary) hypertension: Secondary | ICD-10-CM | POA: Insufficient documentation

## 2011-04-28 DIAGNOSIS — M2340 Loose body in knee, unspecified knee: Secondary | ICD-10-CM | POA: Insufficient documentation

## 2011-04-28 DIAGNOSIS — G4730 Sleep apnea, unspecified: Secondary | ICD-10-CM | POA: Insufficient documentation

## 2011-04-28 DIAGNOSIS — K219 Gastro-esophageal reflux disease without esophagitis: Secondary | ICD-10-CM | POA: Insufficient documentation

## 2011-04-28 HISTORY — PX: KNEE ARTHROSCOPY: SHX127

## 2011-04-28 LAB — POCT I-STAT, CHEM 8
BUN: 9 mg/dL (ref 6–23)
Calcium, Ion: 1.13 mmol/L (ref 1.12–1.32)
Creatinine, Ser: 0.7 mg/dL (ref 0.50–1.10)
TCO2: 24 mmol/L (ref 0–100)

## 2011-04-28 SURGERY — ARTHROSCOPY, KNEE
Anesthesia: General | Site: Knee | Laterality: Left | Wound class: Clean

## 2011-04-28 MED ORDER — ONDANSETRON HCL 4 MG/2ML IJ SOLN
INTRAMUSCULAR | Status: DC | PRN
  Administered 2011-04-28: 4 mg via INTRAVENOUS

## 2011-04-28 MED ORDER — LORAZEPAM 2 MG/ML IJ SOLN: 1.0000 mg | Freq: Once | INTRAMUSCULAR | Status: DC | PRN

## 2011-04-28 MED ORDER — HYDROMORPHONE HCL PF 1 MG/ML IJ SOLN
0.2500 mg | INTRAMUSCULAR | Status: DC | PRN
  Administered 2011-04-28: 0.5 mg via INTRAVENOUS

## 2011-04-28 MED ORDER — CEFAZOLIN SODIUM-DEXTROSE 2-3 GM-% IV SOLR
2.0000 g | INTRAVENOUS | Status: AC
  Administered 2011-04-28: 2 g via INTRAVENOUS

## 2011-04-28 MED ORDER — MIDAZOLAM HCL 5 MG/5ML IJ SOLN
INTRAMUSCULAR | Status: DC | PRN
  Administered 2011-04-28: 1 mg via INTRAVENOUS

## 2011-04-28 MED ORDER — FENTANYL CITRATE 0.05 MG/ML IJ SOLN
INTRAMUSCULAR | Status: DC | PRN
  Administered 2011-04-28 (×2): 50 ug via INTRAVENOUS
  Administered 2011-04-28 (×2): 25 ug via INTRAVENOUS
  Administered 2011-04-28: 50 ug via INTRAVENOUS

## 2011-04-28 MED ORDER — SODIUM CHLORIDE 0.9 % IR SOLN
Status: DC | PRN
  Administered 2011-04-28: 6000 mL

## 2011-04-28 MED ORDER — BUPIVACAINE HCL 0.5 % IJ SOLN
INTRAMUSCULAR | Status: DC | PRN
  Administered 2011-04-28: 30 mL

## 2011-04-28 MED ORDER — FENTANYL CITRATE 0.05 MG/ML IJ SOLN: 50.0000 ug | INTRAMUSCULAR | Status: DC | PRN

## 2011-04-28 MED ORDER — MIDAZOLAM HCL 2 MG/2ML IJ SOLN: 1.0000 mg | INTRAMUSCULAR | Status: DC | PRN

## 2011-04-28 MED ORDER — CHLORHEXIDINE GLUCONATE 4 % EX LIQD: 60.0000 mL | Freq: Once | CUTANEOUS | Status: DC

## 2011-04-28 MED ORDER — PROPOFOL 10 MG/ML IV EMUL
INTRAVENOUS | Status: DC | PRN
  Administered 2011-04-28: 240 mg via INTRAVENOUS

## 2011-04-28 MED ORDER — LACTATED RINGERS IV SOLN
INTRAVENOUS | Status: DC
  Administered 2011-04-28 (×2): via INTRAVENOUS

## 2011-04-28 MED ORDER — DEXTROSE-NACL 5-0.45 % IV SOLN: INTRAVENOUS | Status: DC

## 2011-04-28 MED ORDER — LIDOCAINE HCL (CARDIAC) 20 MG/ML IV SOLN
INTRAVENOUS | Status: DC | PRN
  Administered 2011-04-28: 50 mg via INTRAVENOUS

## 2011-04-28 MED ORDER — HYDROCODONE-ACETAMINOPHEN 5-325 MG PO TABS
1.0000 | ORAL_TABLET | Freq: Once | ORAL | Status: AC
  Administered 2011-04-28: 1 via ORAL

## 2011-04-28 MED ORDER — HYDROCODONE-ACETAMINOPHEN 7.5-325 MG PO TABS: 1.0000 | ORAL_TABLET | ORAL | Status: AC | PRN

## 2011-04-28 SURGICAL SUPPLY — 41 items
BANDAGE ELASTIC 6 VELCRO ST LF (GAUZE/BANDAGES/DRESSINGS) ×2 IMPLANT
BLADE 4.2CUDA (BLADE) IMPLANT
BLADE CUTTER GATOR 3.5 (BLADE) ×2 IMPLANT
BLADE GREAT WHITE 4.2 (BLADE) IMPLANT
BUR 3.5 LG SPHERICAL (BURR) ×1 IMPLANT
BUR VERTEX HOODED 4.5 (BURR) ×2 IMPLANT
BURR 3.5 LG SPHERICAL (BURR) ×2
CANISTER OMNI JUG 16 LITER (MISCELLANEOUS) IMPLANT
CANISTER SUCTION 2500CC (MISCELLANEOUS) IMPLANT
CHLORAPREP W/TINT 26ML (MISCELLANEOUS) ×2 IMPLANT
CLOTH BEACON ORANGE TIMEOUT ST (SAFETY) ×2 IMPLANT
DRAPE ARTHROSCOPY W/POUCH 114 (DRAPES) ×2 IMPLANT
ELECT MENISCUS 165MM 90D (ELECTRODE) IMPLANT
ELECT REM PT RETURN 9FT ADLT (ELECTROSURGICAL)
ELECTRODE REM PT RTRN 9FT ADLT (ELECTROSURGICAL) IMPLANT
GAUZE XEROFORM 1X8 LF (GAUZE/BANDAGES/DRESSINGS) ×2 IMPLANT
GLOVE BIO SURGEON STRL SZ 6.5 (GLOVE) ×2 IMPLANT
GLOVE BIO SURGEON STRL SZ7 (GLOVE) ×2 IMPLANT
GLOVE BIO SURGEON STRL SZ7.5 (GLOVE) ×2 IMPLANT
GLOVE BIOGEL PI IND STRL 7.0 (GLOVE) ×2 IMPLANT
GLOVE BIOGEL PI IND STRL 8 (GLOVE) ×1 IMPLANT
GLOVE BIOGEL PI INDICATOR 7.0 (GLOVE) ×2
GLOVE BIOGEL PI INDICATOR 8 (GLOVE) ×1
GOWN PREVENTION PLUS XLARGE (GOWN DISPOSABLE) ×6 IMPLANT
KNEE WRAP E Z 3 GEL PACK (MISCELLANEOUS) ×2 IMPLANT
NDL SAFETY ECLIPSE 18X1.5 (NEEDLE) IMPLANT
NEEDLE FILTER BLUNT 18X 1/2SAF (NEEDLE) ×1
NEEDLE FILTER BLUNT 18X1 1/2 (NEEDLE) ×1 IMPLANT
NEEDLE HYPO 18GX1.5 SHARP (NEEDLE)
PACK ARTHROSCOPY DSU (CUSTOM PROCEDURE TRAY) ×2 IMPLANT
PACK BASIN DAY SURGERY FS (CUSTOM PROCEDURE TRAY) ×2 IMPLANT
PENCIL BUTTON HOLSTER BLD 10FT (ELECTRODE) IMPLANT
SET ARTHROSCOPY TUBING (MISCELLANEOUS) ×2
SET ARTHROSCOPY TUBING LN (MISCELLANEOUS) ×1 IMPLANT
SLEEVE SCD COMPRESS KNEE MED (MISCELLANEOUS) IMPLANT
SPONGE GAUZE 4X4 12PLY (GAUZE/BANDAGES/DRESSINGS) ×2 IMPLANT
SYR 3ML 18GX1 1/2 (SYRINGE) IMPLANT
SYR 5ML LL (SYRINGE) ×2 IMPLANT
TOWEL OR 17X24 6PK STRL BLUE (TOWEL DISPOSABLE) ×2 IMPLANT
WAND STAR VAC 90 (SURGICAL WAND) ×2 IMPLANT
WATER STERILE IRR 1000ML POUR (IV SOLUTION) ×2 IMPLANT

## 2011-04-28 NOTE — Transfer of Care (Signed)
Immediate Anesthesia Transfer of Care Note  Patient: Karla Rodriguez  Procedure(s) Performed: Procedure(s) (LRB): ARTHROSCOPY KNEE (Left)  Patient Location: PACU  Anesthesia Type: General  Level of Consciousness: awake, alert , oriented and patient cooperative  Airway & Oxygen Therapy: Patient Spontanous Breathing and Patient connected to face mask oxygen  Post-op Assessment: Report given to PACU RN and Post -op Vital signs reviewed and stable  Post vital signs: Reviewed and stable  Complications: No apparent anesthesia complications

## 2011-04-28 NOTE — Discharge Instructions (Signed)
[facility]  [address] [place], [place] [address] [phone]   Post Anesthesia Home Care Instructions  Activity: Get plenty of rest for the remainder of the day. A responsible adult should stay with you for 24 hours following the procedure.  For the next 24 hours, DO NOT: -Drive a car -Operate machinery -Drink alcoholic beverages -Take any medication unless instructed by your physician -Make any legal decisions or sign important papers.  Meals: Start with liquid foods such as gelatin or soup. Progress to regular foods as tolerated. Avoid greasy, spicy, heavy foods. If nausea and/or vomiting occur, drink only clear liquids until the nausea and/or vomiting subsides. Call your physician if vomiting continues.  Special Instructions/Symptoms: Your throat may feel dry or sore from the anesthesia or the breathing tube placed in your throat during surgery. If this causes discomfort, gargle with warm salt water. The discomfort should disappear within 24 hours.  Call your surgeon if you experience:   1.  Fever over 101.0. 2.  Inability to urinate. 3.  Nausea and/or vomiting. 4.  Extreme swelling or bruising at the surgical site. 5.  Continued bleeding from the incision. 6.  Increased pain, redness or drainage from the incision. 7.  Problems related to your pain medication.  

## 2011-04-28 NOTE — Anesthesia Postprocedure Evaluation (Signed)
  Anesthesia Post-op Note  Patient: Karla Rodriguez  Procedure(s) Performed: Procedure(s) (LRB): ARTHROSCOPY KNEE (Left)  Patient Location: PACU  Anesthesia Type: General  Level of Consciousness: awake  Airway and Oxygen Therapy: Patient Spontanous Breathing  Post-op Pain: mild  Post-op Assessment: Post-op Vital signs reviewed, Patient's Cardiovascular Status Stable, Respiratory Function Stable, Patent Airway, No signs of Nausea or vomiting and Pain level controlled  Post-op Vital Signs: stable  Complications: No apparent anesthesia complications

## 2011-04-28 NOTE — Anesthesia Procedure Notes (Signed)
Procedure Name: LMA Insertion Date/Time: 04/28/2011 1:35 PM Performed by: Zenia Resides D Pre-anesthesia Checklist: Patient identified, Emergency Drugs available, Suction available, Patient being monitored and Timeout performed Patient Re-evaluated:Patient Re-evaluated prior to inductionOxygen Delivery Method: Circle System Utilized Preoxygenation: Pre-oxygenation with 100% oxygen Intubation Type: IV induction Ventilation: Mask ventilation without difficulty LMA: LMA inserted LMA Size: 4.0 Number of attempts: 1 Airway Equipment and Method: bite block Placement Confirmation: positive ETCO2 and breath sounds checked- equal and bilateral Tube secured with: Tape Dental Injury: Teeth and Oropharynx as per pre-operative assessment

## 2011-04-28 NOTE — Op Note (Signed)
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 Turner Daniels M.D.  Assist: Shirl Harris PA-C  Anes: General LMA  EBL: Minimal  Fluids: 800 cc   Indications: 46 year-old female with catching popping and locking of her left knee that underwent a Houser procedure 30 years ago for patellofemoral joint syndrome. Plan x-ray show multiple osteochondral loose bodies in the joint primarily posterior lateral annular history and mechanical locking arthroscopic removal has been discussed and recommended to the patient. Risk benefits of surgery been discussed. Pt has failed conservative treatment with anti-inflammatory medicines, physical therapy, and modified activites but did get good temporarily from an intra-articular cortisone injection. Pain has recurred and patient desires elective arthroscopic evaluation and treatment of knee. Risks and benefits of surgery have been discussed and questions answered.  Procedure: Patient identified by arm band and taken to the operating room at the day surgery Center. The appropriate anesthetic monitors were attached, and General LMA anesthesia was induced without difficulty. Lateral post was applied to the table and the lower extremity was prepped and draped in usual sterile fashion from the ankle to the midthigh. Time out procedure was performed. We began the operation by making standard inferior lateral and inferior medial peripatellar portals with a #11 blade allowing introduction of the arthroscope through the inferior lateral portal and the out flow to the inferior medial portal. 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 Moving into the medial compartment the meniscal cartilage was in excellent condition as was the articular cartilage. The anterior cruciate ligament and PCL were intact moving to the lateral compartment degenerative tearing of the lateral meniscus was noted and behind of the bare area of the lateral meniscus we identified 3 osteochondral loose bodies one of them 1.2 cm in size. We then enlarged the anteromedial portal to about 8 mm in length and using a pituitary rongeur extracted the large loose body from behind the lateral meniscus without difficulty. In a similar fashion the other 2 osteochondral loose bodies were removed. We also identify chondromalacia grade 4 to the anterior and distal aspect of the lateral femoral condyle this is really back to a stable margin removing flap tears. The gutters were cleared medially and laterally and the knee was thoroughly irrigated out normal saline solution using the arthroscopic pump. Finally the portals and the knee itself was infiltrated with 20 cc of half percent Marcaine and epinephrine solution for postoperative anesthesia. The knee was irrigated out normal saline solution. A dressing of xerofoam 4 x 4 dressing sponges, web roll and an Ace wrap was applied. The patient was awakened extubated and taken to the recovery without difficulty.    Signed: Nestor Lewandowsky, MD

## 2011-04-28 NOTE — Interval H&P Note (Signed)
History and Physical Interval Note:  04/28/2011 12:45 PM  Karla Rodriguez  has presented today for surgery, with the diagnosis of left knee chondromalacia with loose bodies  The various methods of treatment have been discussed with the patient and family. After consideration of risks, benefits and other options for treatment, the patient has consented to  Procedure(s) (LRB): ARTHROSCOPY KNEE (Left) as a surgical intervention .  The patients' history has been reviewed, patient examined, no change in status, stable for surgery.  I have reviewed the patients' chart and labs.  Questions were answered to the patient's satisfaction.     Nestor Lewandowsky

## 2011-04-28 NOTE — Anesthesia Preprocedure Evaluation (Signed)
Anesthesia Evaluation  Patient identified by MRN, date of birth, ID band Patient awake    Reviewed: Allergy & Precautions, H&P , NPO status , Patient's Chart, lab work & pertinent test results  Airway Mallampati: II TM Distance: >3 FB Neck ROM: Full    Dental   Pulmonary sleep apnea ,    Pulmonary exam normal       Cardiovascular hypertension,     Neuro/Psych Anxiety  Neuromuscular disease    GI/Hepatic hiatal hernia, GERD-  ,(+)     substance abuse  alcohol use,   Endo/Other  Diabetes mellitus-  Renal/GU      Musculoskeletal   Abdominal (+) + obese,   Peds  Hematology   Anesthesia Other Findings   Reproductive/Obstetrics                           Anesthesia Physical Anesthesia Plan  ASA: III  Anesthesia Plan: General   Post-op Pain Management:    Induction: Intravenous  Airway Management Planned: LMA  Additional Equipment:   Intra-op Plan:   Post-operative Plan: Extubation in OR  Informed Consent: I have reviewed the patients History and Physical, chart, labs and discussed the procedure including the risks, benefits and alternatives for the proposed anesthesia with the patient or authorized representative who has indicated his/her understanding and acceptance.     Plan Discussed with: CRNA and Surgeon  Anesthesia Plan Comments:         Anesthesia Quick Evaluation

## 2011-04-29 ENCOUNTER — Encounter (HOSPITAL_BASED_OUTPATIENT_CLINIC_OR_DEPARTMENT_OTHER): Payer: Self-pay | Admitting: Orthopedic Surgery

## 2011-05-03 ENCOUNTER — Encounter (HOSPITAL_BASED_OUTPATIENT_CLINIC_OR_DEPARTMENT_OTHER): Payer: Self-pay

## 2011-07-01 ENCOUNTER — Other Ambulatory Visit: Payer: Self-pay | Admitting: Orthopedic Surgery

## 2011-07-02 ENCOUNTER — Encounter (HOSPITAL_COMMUNITY): Payer: Self-pay | Admitting: Pharmacy Technician

## 2011-07-12 ENCOUNTER — Encounter (HOSPITAL_COMMUNITY)
Admission: RE | Admit: 2011-07-12 | Discharge: 2011-07-12 | Disposition: A | Discharge status: Home / Self Care | Payer: Managed Care, Other (non HMO) | Source: Ambulatory Visit | Attending: Orthopedic Surgery | Admitting: Orthopedic Surgery

## 2011-07-12 ENCOUNTER — Encounter (HOSPITAL_COMMUNITY): Payer: Self-pay

## 2011-07-12 HISTORY — DX: Constipation, unspecified: K59.00

## 2011-07-12 HISTORY — DX: Major depressive disorder, single episode, unspecified: F32.9

## 2011-07-12 HISTORY — DX: Depression, unspecified: F32.A

## 2011-07-12 LAB — URINALYSIS, ROUTINE W REFLEX MICROSCOPIC
Leukocytes, UA: NEGATIVE
Protein, ur: NEGATIVE mg/dL
Urobilinogen, UA: 1 mg/dL (ref 0.0–1.0)

## 2011-07-12 LAB — CBC
HCT: 41.8 % (ref 36.0–46.0)
Hemoglobin: 14.8 g/dL (ref 12.0–15.0)
MCHC: 35.4 g/dL (ref 30.0–36.0)
WBC: 9.2 10*3/uL (ref 4.0–10.5)

## 2011-07-12 LAB — TYPE AND SCREEN
ABO/RH(D): AB POS
Antibody Screen: NEGATIVE

## 2011-07-12 LAB — DIFFERENTIAL
Lymphocytes Relative: 21 % (ref 12–46)
Monocytes Absolute: 0.8 10*3/uL (ref 0.1–1.0)
Monocytes Relative: 9 % (ref 3–12)
Neutro Abs: 6.3 10*3/uL (ref 1.7–7.7)

## 2011-07-12 LAB — SURGICAL PCR SCREEN: MRSA, PCR: NEGATIVE

## 2011-07-12 LAB — BASIC METABOLIC PANEL
BUN: 6 mg/dL (ref 6–23)
Chloride: 102 mEq/L (ref 96–112)
GFR calc Af Amer: >90 mL/min (ref >90)
Potassium: 3.9 mEq/L (ref 3.5–5.1)

## 2011-07-12 NOTE — Progress Notes (Signed)
Call to [facility]- spoke /w Leeann, requested last OV note & stress test.

## 2011-07-12 NOTE — Progress Notes (Signed)
Spoke with Eilene Ghazi, Diabetes Nurse Spe., pt. Told not to turn pump off unless she is on Insulin Drip here at [facility].  Pt. Also advised to call Endocrinologist of impending surgery

## 2011-07-12 NOTE — Pre-Procedure Instructions (Signed)
20 Karla Rodriguez  07/12/2011   Your procedure is scheduled on:  07/16/2011  Report to [facility] at 7:00 AM.  Call this number if you have problems the morning of surgery: [phone]   Remember:   Do not eat food:After Midnight. THURSDAY  May have clear liquids: up to 4 Hours before arrival.  NOTHING after 0300a.m.  Clear liquids include soda, tea, black coffee, apple or grape juice, broth.  Take these medicines the morning of surgery with A SIP OF WATER: Lexapro, BASAL Rate on Insulin PUMP   Do not wear jewelry, make-up or nail polish.  Do not wear lotions, powders, or perfumes. You may wear deodorant.  Do not shave 48 hours prior to surgery. Men may shave face and neck.  Do not bring valuables to the hospital.  Contacts, dentures or bridgework may not be worn into surgery.  Leave suitcase in the car. After surgery it may be brought to your room.  For patients admitted to the hospital, checkout time is 11:00 AM the day of discharge.   Patients discharged the day of surgery will not be allowed to drive home.  Name and phone number of your driver: /w Larisa   Special Instructions: CHG Shower Use Special Wash: 1/2 bottle night before surgery and 1/2 bottle morning of surgery.   Please read over the following fact sheets that you were given: Pain Booklet, Coughing and Deep Breathing, Blood Transfusion Information, Total Joint Packet, MRSA Information and Surgical Site Infection Prevention

## 2011-07-13 NOTE — Consult Note (Signed)
Anesthesia Chart Review:  Patient is a 46 year old female scheduled for left TKA on 07/16/11.  History includes DM with insulin pump, hysterectomy, obesity with BMI 43, smoking, OCD, gastroparesis, anxiety, depression, anemia, IBS, GERD, hypercholesterolemia, ETOH abuse (sober since 2010).  She is s/p left knee arthroscopy on 04/28/11.  Labs WNL.      CXR on 07/12/11 showed: Bronchitic changes with peribronchial thickening. Heart and mediastinal contours  are within normal limits. No focal opacities or effusions. No  acute bony abnormality.  She had similar findings on her previous CXR in May 2011 and subsequently had a chest CT for possible nodule--which was negative. Sats were 100% on 07/12/11.  Message was left for Agustin Cree at Dr. Wadie Lessen office.  EKG on 04/27/11 showed NSR.  She has been evaluated by Dr. Nicki Guadalajara [facility]) in the past.  She had a stress test on 07/03/09 that showed normal myocardial perfusion, EF 80%.  Her echo on 04/01/06 showed mild LVH, normal LV systolic function, EF > 55%, mild MR, no MVP, trace TR.  If no acute cardiopulmonary symptoms on the day of surgery then anticipate she can proceed as planned.  Her glucose will be checked on arrival.  Anticipate transition from insulin pump to insulin gtt once evaluated by her Anesthesiologist on the day of surgery.  Shonna Chock, PA-C

## 2011-07-14 NOTE — H&P (Signed)
Subjective: Karla Rodriguez returns in followup for her left knee which is approximately 2 months status post arthroscopic removal of several loose bodies, partial lateral meniscectomy, debridement of grade 3-4 chondromalacia.  She reports that she was doing well until about a week ago when her knee locked.  Since then, she has had significant knee pain particularly with weightbearing.  She is still unable to completely extend her knee.  She denies any discrete injury and reports that the pain began gradually.  All: None listed  ROS: 14 point review of systems form filled out by the patient was reviewed and was negative as it relates to the history of present illness except for: Asthma, and sleep apnea  PMH: Diabetes.  She has an insulin pump.  She takes medication for hypertension and high cholesterol.  Past surgical history significant for hysterectomy and 2 knee surgeries.  The  FHx: Diabetes and hypertension  SocHx: Smokes one pack per day.  Does not drink alcohol.  She works as a Artist.  PE: Well-developed, well-nourished 46 year old female who is alert and oriented and in no acute distress.  She is 5 feet 4 inches tall and weighs 220 pounds.  Examination of the left knee demonstrates well-healed surgical portals.  Range of motion is 10-100.  She is markedly tender to palpation over the lateral joint line and mildly tender medially.  She is neurovascularly intact.  X-rays: 4 views of the left knee demonstrate end-stage arthritis in the lateral and patellofemoral compartments  Assess: End-stage arthritis of the left knee in a 46 year old patient  Plan: We have discussed the options in detail with Karla Rodriguez today.  We tried a cortisone injection today that provided her with minimal pain relief at the 10 min. mark.  We then discussed total knee arthroplasty with her in detail.  She understands all the risks and benefits and will talk to Agustin Cree today about scheduling.

## 2011-07-15 MED ORDER — CEFAZOLIN SODIUM-DEXTROSE 2-3 GM-% IV SOLR
2.0000 g | INTRAVENOUS | Status: AC
  Administered 2011-07-16: 2 g via INTRAVENOUS
  Filled 2011-07-15: qty 50

## 2011-07-15 MED ORDER — DEXTROSE-NACL 5-0.45 % IV SOLN: INTRAVENOUS | Status: DC

## 2011-07-15 MED ORDER — CHLORHEXIDINE GLUCONATE 4 % EX LIQD: 60.0000 mL | Freq: Once | CUTANEOUS | Status: DC

## 2011-07-16 ENCOUNTER — Inpatient Hospital Stay (HOSPITAL_COMMUNITY)
Admission: RE | Admit: 2011-07-16 | Discharge: 2011-07-19 | DRG: 470 | Disposition: A | Discharge status: Home / Self Care | Payer: Managed Care, Other (non HMO) | Source: Ambulatory Visit | Attending: Orthopedic Surgery | Admitting: Orthopedic Surgery

## 2011-07-16 ENCOUNTER — Encounter (HOSPITAL_COMMUNITY): Payer: Self-pay | Admitting: *Deleted

## 2011-07-16 ENCOUNTER — Ambulatory Visit (HOSPITAL_COMMUNITY): Payer: Managed Care, Other (non HMO) | Admitting: Vascular Surgery

## 2011-07-16 ENCOUNTER — Encounter (HOSPITAL_COMMUNITY): Payer: Self-pay | Admitting: Vascular Surgery

## 2011-07-16 ENCOUNTER — Encounter (HOSPITAL_COMMUNITY): Payer: Self-pay | Admitting: General Practice

## 2011-07-16 ENCOUNTER — Encounter (HOSPITAL_COMMUNITY)
Admission: RE | Disposition: A | Discharge status: Home / Self Care | Payer: Self-pay | Source: Ambulatory Visit | Attending: Orthopedic Surgery

## 2011-07-16 DIAGNOSIS — F411 Generalized anxiety disorder: Secondary | ICD-10-CM | POA: Diagnosis present

## 2011-07-16 DIAGNOSIS — F429 Obsessive-compulsive disorder, unspecified: Secondary | ICD-10-CM | POA: Diagnosis present

## 2011-07-16 DIAGNOSIS — G4730 Sleep apnea, unspecified: Secondary | ICD-10-CM | POA: Diagnosis present

## 2011-07-16 DIAGNOSIS — F17200 Nicotine dependence, unspecified, uncomplicated: Secondary | ICD-10-CM | POA: Diagnosis present

## 2011-07-16 DIAGNOSIS — K219 Gastro-esophageal reflux disease without esophagitis: Secondary | ICD-10-CM | POA: Diagnosis present

## 2011-07-16 DIAGNOSIS — Z85828 Personal history of other malignant neoplasm of skin: Secondary | ICD-10-CM

## 2011-07-16 DIAGNOSIS — E119 Type 2 diabetes mellitus without complications: Secondary | ICD-10-CM | POA: Diagnosis present

## 2011-07-16 DIAGNOSIS — Z90710 Acquired absence of both cervix and uterus: Secondary | ICD-10-CM

## 2011-07-16 DIAGNOSIS — I10 Essential (primary) hypertension: Secondary | ICD-10-CM | POA: Diagnosis present

## 2011-07-16 DIAGNOSIS — J45909 Unspecified asthma, uncomplicated: Secondary | ICD-10-CM | POA: Diagnosis present

## 2011-07-16 DIAGNOSIS — K449 Diaphragmatic hernia without obstruction or gangrene: Secondary | ICD-10-CM | POA: Diagnosis present

## 2011-07-16 DIAGNOSIS — M1710 Unilateral primary osteoarthritis, unspecified knee: Principal | ICD-10-CM | POA: Diagnosis present

## 2011-07-16 DIAGNOSIS — M1712 Unilateral primary osteoarthritis, left knee: Secondary | ICD-10-CM | POA: Diagnosis present

## 2011-07-16 DIAGNOSIS — F1010 Alcohol abuse, uncomplicated: Secondary | ICD-10-CM | POA: Diagnosis present

## 2011-07-16 DIAGNOSIS — E7800 Pure hypercholesterolemia, unspecified: Secondary | ICD-10-CM | POA: Diagnosis present

## 2011-07-16 DIAGNOSIS — Z794 Long term (current) use of insulin: Secondary | ICD-10-CM

## 2011-07-16 HISTORY — DX: Bipolar disorder, unspecified: F31.9

## 2011-07-16 HISTORY — DX: Type 1 diabetes mellitus without complications: E10.9

## 2011-07-16 HISTORY — PX: TOTAL KNEE ARTHROPLASTY: SHX125

## 2011-07-16 HISTORY — DX: Unilateral primary osteoarthritis, unspecified knee: M17.10

## 2011-07-16 HISTORY — DX: Osteoarthritis of knee, unspecified: M17.9

## 2011-07-16 LAB — GLUCOSE, CAPILLARY
Glucose-Capillary: 271 mg/dL — ABNORMAL HIGH (ref 70–99)
Glucose-Capillary: 239 mg/dL — ABNORMAL HIGH (ref 70–99)
Glucose-Capillary: 194 mg/dL — ABNORMAL HIGH (ref 70–99)
Glucose-Capillary: 224 mg/dL — ABNORMAL HIGH (ref 70–99)

## 2011-07-16 SURGERY — ARTHROPLASTY, KNEE, TOTAL
Anesthesia: General | Site: Knee | Laterality: Left | Wound class: Clean

## 2011-07-16 MED ORDER — BISACODYL 5 MG PO TBEC
5.0000 mg | DELAYED_RELEASE_TABLET | Freq: Every day | ORAL | Status: DC | PRN
  Administered 2011-07-18: 5 mg via ORAL
  Filled 2011-07-16: qty 1

## 2011-07-16 MED ORDER — LAMOTRIGINE 150 MG PO TABS
300.0000 mg | ORAL_TABLET | Freq: Every day | ORAL | Status: DC
  Administered 2011-07-16 – 2011-07-18 (×3): 300 mg via ORAL
  Filled 2011-07-16 (×4): qty 2

## 2011-07-16 MED ORDER — ALUM & MAG HYDROXIDE-SIMETH 200-200-20 MG/5ML PO SUSP: 30.0000 mL | ORAL | Status: DC | PRN

## 2011-07-16 MED ORDER — METHOCARBAMOL 100 MG/ML IJ SOLN
500.0000 mg | Freq: Four times a day (QID) | INTRAVENOUS | Status: DC | PRN
  Filled 2011-07-16: qty 5

## 2011-07-16 MED ORDER — PROPOFOL 10 MG/ML IV BOLUS
INTRAVENOUS | Status: DC | PRN
  Administered 2011-07-16: 200 mg via INTRAVENOUS

## 2011-07-16 MED ORDER — SODIUM CHLORIDE 0.9 % IR SOLN
Status: DC | PRN
  Administered 2011-07-16: 3000 mL

## 2011-07-16 MED ORDER — FENTANYL CITRATE 0.05 MG/ML IJ SOLN
INTRAMUSCULAR | Status: DC | PRN
  Administered 2011-07-16 (×3): 50 ug via INTRAVENOUS
  Administered 2011-07-16: 150 ug via INTRAVENOUS
  Administered 2011-07-16 (×3): 50 ug via INTRAVENOUS

## 2011-07-16 MED ORDER — DROPERIDOL 2.5 MG/ML IJ SOLN
INTRAMUSCULAR | Status: DC | PRN
  Administered 2011-07-16: 0.625 mg via INTRAVENOUS

## 2011-07-16 MED ORDER — ALBUTEROL SULFATE HFA 108 (90 BASE) MCG/ACT IN AERS: 2.0000 | INHALATION_SPRAY | Freq: Four times a day (QID) | RESPIRATORY_TRACT | Status: DC | PRN

## 2011-07-16 MED ORDER — MENTHOL 3 MG MT LOZG: 1.0000 | LOZENGE | OROMUCOSAL | Status: DC | PRN

## 2011-07-16 MED ORDER — ROCURONIUM BROMIDE 100 MG/10ML IV SOLN
INTRAVENOUS | Status: DC | PRN
  Administered 2011-07-16: 40 mg via INTRAVENOUS

## 2011-07-16 MED ORDER — ACETAMINOPHEN 650 MG RE SUPP: 650.0000 mg | Freq: Four times a day (QID) | RECTAL | Status: DC | PRN

## 2011-07-16 MED ORDER — LACTATED RINGERS IV SOLN
INTRAVENOUS | Status: DC
  Administered 2011-07-16: 09:00:00 via INTRAVENOUS

## 2011-07-16 MED ORDER — MAGNESIUM HYDROXIDE 400 MG/5ML PO SUSP: 30.0000 mL | Freq: Every day | ORAL | Status: DC | PRN

## 2011-07-16 MED ORDER — HYDROMORPHONE HCL PF 1 MG/ML IJ SOLN
INTRAMUSCULAR | Status: AC
  Filled 2011-07-16: qty 1

## 2011-07-16 MED ORDER — 0.9 % SODIUM CHLORIDE (POUR BTL) OPTIME
TOPICAL | Status: DC | PRN
  Administered 2011-07-16: 1000 mL

## 2011-07-16 MED ORDER — FENTANYL CITRATE 0.05 MG/ML IJ SOLN
INTRAMUSCULAR | Status: AC
  Filled 2011-07-16: qty 2

## 2011-07-16 MED ORDER — ONDANSETRON HCL 4 MG PO TABS: 4.0000 mg | ORAL_TABLET | Freq: Four times a day (QID) | ORAL | Status: DC | PRN

## 2011-07-16 MED ORDER — MUPIROCIN 2 % EX OINT
TOPICAL_OINTMENT | Freq: Two times a day (BID) | CUTANEOUS | Status: DC
  Administered 2011-07-16: 1 via NASAL
  Filled 2011-07-16: qty 22

## 2011-07-16 MED ORDER — METHOCARBAMOL 500 MG PO TABS
500.0000 mg | ORAL_TABLET | Freq: Four times a day (QID) | ORAL | Status: DC | PRN
  Administered 2011-07-16 – 2011-07-19 (×8): 500 mg via ORAL
  Filled 2011-07-16 (×8): qty 1

## 2011-07-16 MED ORDER — ALPRAZOLAM 0.25 MG PO TABS: 0.2500 mg | ORAL_TABLET | Freq: Every evening | ORAL | Status: DC | PRN

## 2011-07-16 MED ORDER — ONDANSETRON HCL 4 MG/2ML IJ SOLN: 4.0000 mg | Freq: Once | INTRAMUSCULAR | Status: DC | PRN

## 2011-07-16 MED ORDER — INSULIN PUMP
Freq: Three times a day (TID) | SUBCUTANEOUS | Status: DC
  Administered 2011-07-16: 13.5 via SUBCUTANEOUS
  Administered 2011-07-17 (×2): 7 via SUBCUTANEOUS
  Administered 2011-07-17: 4.9 via SUBCUTANEOUS
  Administered 2011-07-17: 9 via SUBCUTANEOUS
  Administered 2011-07-18 (×3): via SUBCUTANEOUS
  Administered 2011-07-19: 7 via SUBCUTANEOUS
  Administered 2011-07-19: 3.2 via SUBCUTANEOUS
  Filled 2011-07-16: qty 1

## 2011-07-16 MED ORDER — HYDROMORPHONE HCL PF 1 MG/ML IJ SOLN
0.2500 mg | INTRAMUSCULAR | Status: DC | PRN
  Administered 2011-07-16 (×3): 0.5 mg via INTRAVENOUS

## 2011-07-16 MED ORDER — NICOTINE 14 MG/24HR TD PT24
14.0000 mg | MEDICATED_PATCH | Freq: Every day | TRANSDERMAL | Status: DC
  Administered 2011-07-16 – 2011-07-18 (×3): 14 mg via TRANSDERMAL
  Filled 2011-07-16 (×4): qty 1

## 2011-07-16 MED ORDER — MONTELUKAST SODIUM 10 MG PO TABS
10.0000 mg | ORAL_TABLET | Freq: Every day | ORAL | Status: DC
  Administered 2011-07-16 – 2011-07-18 (×3): 10 mg via ORAL
  Filled 2011-07-16 (×4): qty 1

## 2011-07-16 MED ORDER — NEOSTIGMINE METHYLSULFATE 1 MG/ML IJ SOLN
INTRAMUSCULAR | Status: DC | PRN
  Administered 2011-07-16: .5 mg via INTRAVENOUS

## 2011-07-16 MED ORDER — OXYCODONE HCL 5 MG PO TABS
5.0000 mg | ORAL_TABLET | ORAL | Status: DC | PRN
  Administered 2011-07-16 – 2011-07-19 (×15): 10 mg via ORAL
  Filled 2011-07-16 (×15): qty 2

## 2011-07-16 MED ORDER — PHENOL 1.4 % MT LIQD: 1.0000 | OROMUCOSAL | Status: DC | PRN

## 2011-07-16 MED ORDER — MENTHOL 3 MG MT LOZG
1.0000 | LOZENGE | OROMUCOSAL | Status: DC | PRN
  Filled 2011-07-16 (×2): qty 9

## 2011-07-16 MED ORDER — BUPIVACAINE-EPINEPHRINE PF 0.5-1:200000 % IJ SOLN
INTRAMUSCULAR | Status: DC | PRN
  Administered 2011-07-16: 25 mL

## 2011-07-16 MED ORDER — ACETAMINOPHEN 325 MG PO TABS: 650.0000 mg | ORAL_TABLET | Freq: Four times a day (QID) | ORAL | Status: DC | PRN

## 2011-07-16 MED ORDER — HYDROMORPHONE HCL PF 1 MG/ML IJ SOLN
INTRAMUSCULAR | Status: AC
  Administered 2011-07-17: 1 mg via INTRAVENOUS
  Filled 2011-07-16: qty 1

## 2011-07-16 MED ORDER — LISINOPRIL 10 MG PO TABS
10.0000 mg | ORAL_TABLET | Freq: Every day | ORAL | Status: DC
  Administered 2011-07-16 – 2011-07-18 (×3): 10 mg via ORAL
  Filled 2011-07-16 (×4): qty 1

## 2011-07-16 MED ORDER — PANTOPRAZOLE SODIUM 40 MG PO TBEC
80.0000 mg | DELAYED_RELEASE_TABLET | Freq: Every day | ORAL | Status: DC
  Administered 2011-07-16 – 2011-07-18 (×3): 80 mg via ORAL
  Filled 2011-07-16: qty 2
  Filled 2011-07-16 (×2): qty 1

## 2011-07-16 MED ORDER — EPHEDRINE SULFATE 50 MG/ML IJ SOLN
INTRAMUSCULAR | Status: DC | PRN
  Administered 2011-07-16: 10 mg via INTRAVENOUS

## 2011-07-16 MED ORDER — METOCLOPRAMIDE HCL 10 MG PO TABS: 5.0000 mg | ORAL_TABLET | Freq: Three times a day (TID) | ORAL | Status: DC | PRN

## 2011-07-16 MED ORDER — LACTATED RINGERS IV SOLN
INTRAVENOUS | Status: DC | PRN
  Administered 2011-07-16 (×3): via INTRAVENOUS

## 2011-07-16 MED ORDER — ESCITALOPRAM OXALATE 20 MG PO TABS
20.0000 mg | ORAL_TABLET | Freq: Every morning | ORAL | Status: DC
  Administered 2011-07-17 – 2011-07-19 (×3): 20 mg via ORAL
  Filled 2011-07-16 (×3): qty 1

## 2011-07-16 MED ORDER — SODIUM CHLORIDE 0.9 % IV SOLN
INTRAVENOUS | Status: DC
  Administered 2011-07-16: 125 mL/h via INTRAVENOUS
  Administered 2011-07-17: 03:00:00 via INTRAVENOUS

## 2011-07-16 MED ORDER — KCL IN DEXTROSE-NACL 20-5-0.45 MEQ/L-%-% IV SOLN
INTRAVENOUS | Status: AC
  Filled 2011-07-16: qty 1000

## 2011-07-16 MED ORDER — LORATADINE 10 MG PO TABS
10.0000 mg | ORAL_TABLET | Freq: Every day | ORAL | Status: DC
  Administered 2011-07-16 – 2011-07-18 (×3): 10 mg via ORAL
  Filled 2011-07-16 (×4): qty 1

## 2011-07-16 MED ORDER — DIPHENHYDRAMINE HCL 12.5 MG/5ML PO ELIX: 12.5000 mg | ORAL_SOLUTION | ORAL | Status: DC | PRN

## 2011-07-16 MED ORDER — ONDANSETRON HCL 4 MG/2ML IJ SOLN
INTRAMUSCULAR | Status: DC | PRN
  Administered 2011-07-16 (×2): 4 mg via INTRAVENOUS

## 2011-07-16 MED ORDER — LIDOCAINE HCL (CARDIAC) 20 MG/ML IV SOLN
INTRAVENOUS | Status: DC | PRN
  Administered 2011-07-16: 80 mg via INTRAVENOUS

## 2011-07-16 MED ORDER — HYDROMORPHONE HCL PF 1 MG/ML IJ SOLN
0.5000 mg | INTRAMUSCULAR | Status: DC | PRN
  Administered 2011-07-16 – 2011-07-18 (×12): 1 mg via INTRAVENOUS
  Filled 2011-07-16 (×13): qty 1

## 2011-07-16 MED ORDER — LIDOCAINE HCL 4 % MT SOLN
OROMUCOSAL | Status: DC | PRN
  Administered 2011-07-16: 4 mL via TOPICAL

## 2011-07-16 MED ORDER — METOCLOPRAMIDE HCL 5 MG/ML IJ SOLN: 5.0000 mg | Freq: Three times a day (TID) | INTRAMUSCULAR | Status: DC | PRN

## 2011-07-16 MED ORDER — ASENAPINE MALEATE 5 MG SL SUBL
5.0000 mg | SUBLINGUAL_TABLET | Freq: Every day | SUBLINGUAL | Status: DC
  Administered 2011-07-16 – 2011-07-18 (×3): 5 mg via SUBLINGUAL
  Filled 2011-07-16 (×4): qty 1

## 2011-07-16 MED ORDER — FLEET ENEMA 7-19 GM/118ML RE ENEM: 1.0000 | ENEMA | Freq: Once | RECTAL | Status: AC | PRN

## 2011-07-16 MED ORDER — FENTANYL CITRATE 0.05 MG/ML IJ SOLN
50.0000 ug | INTRAMUSCULAR | Status: DC | PRN
  Administered 2011-07-16: 100 ug via INTRAVENOUS

## 2011-07-16 MED ORDER — ONDANSETRON HCL 4 MG/2ML IJ SOLN
4.0000 mg | Freq: Four times a day (QID) | INTRAMUSCULAR | Status: DC | PRN
  Filled 2011-07-16: qty 2

## 2011-07-16 MED ORDER — KCL IN DEXTROSE-NACL 20-5-0.45 MEQ/L-%-% IV SOLN
INTRAVENOUS | Status: DC
  Administered 2011-07-16: 1000 mL via INTRAVENOUS
  Filled 2011-07-16 (×3): qty 1000

## 2011-07-16 MED ORDER — ATORVASTATIN CALCIUM 40 MG PO TABS
40.0000 mg | ORAL_TABLET | Freq: Every day | ORAL | Status: DC
  Administered 2011-07-16 – 2011-07-18 (×3): 40 mg via ORAL
  Filled 2011-07-16 (×4): qty 1

## 2011-07-16 MED ORDER — ASPIRIN EC 325 MG PO TBEC
325.0000 mg | DELAYED_RELEASE_TABLET | Freq: Two times a day (BID) | ORAL | Status: DC
  Administered 2011-07-16 – 2011-07-19 (×7): 325 mg via ORAL
  Filled 2011-07-16 (×8): qty 1

## 2011-07-16 MED ORDER — CEFUROXIME SODIUM 1.5 G IJ SOLR
INTRAMUSCULAR | Status: DC | PRN
  Administered 2011-07-16: 1.5 g

## 2011-07-16 MED ORDER — RAMELTEON 8 MG PO TABS
8.0000 mg | ORAL_TABLET | Freq: Every day | ORAL | Status: DC
  Administered 2011-07-16 – 2011-07-18 (×3): 8 mg via ORAL
  Filled 2011-07-16 (×4): qty 1

## 2011-07-16 SURGICAL SUPPLY — 58 items
BANDAGE ESMARK 6X9 LF (GAUZE/BANDAGES/DRESSINGS) ×1 IMPLANT
BLADE SAG 18X100X1.27 (BLADE) ×2 IMPLANT
BLADE SAW SGTL 13X75X1.27 (BLADE) ×4 IMPLANT
BLADE SURG ROTATE 9660 (MISCELLANEOUS) IMPLANT
BNDG CMPR 9X6 STRL LF SNTH (GAUZE/BANDAGES/DRESSINGS) ×1
BNDG CMPR MED 10X6 ELC LF (GAUZE/BANDAGES/DRESSINGS) ×1
BNDG ELASTIC 6X10 VLCR STRL LF (GAUZE/BANDAGES/DRESSINGS) ×2 IMPLANT
BNDG ESMARK 6X9 LF (GAUZE/BANDAGES/DRESSINGS) ×2
BOWL SMART MIX CTS (DISPOSABLE) ×2 IMPLANT
CEMENT HV SMART SET (Cement) ×4 IMPLANT
CLOTH BEACON ORANGE TIMEOUT ST (SAFETY) ×2 IMPLANT
COVER BACK TABLE 24X17X13 BIG (DRAPES) ×2 IMPLANT
COVER SURGICAL LIGHT HANDLE (MISCELLANEOUS) ×2 IMPLANT
CUFF TOURNIQUET SINGLE 34IN LL (TOURNIQUET CUFF) ×2 IMPLANT
CUFF TOURNIQUET SINGLE 44IN (TOURNIQUET CUFF) IMPLANT
DRAPE EXTREMITY T 121X128X90 (DRAPE) ×2 IMPLANT
DRAPE U-SHAPE 47X51 STRL (DRAPES) ×2 IMPLANT
DURAPREP 26ML APPLICATOR (WOUND CARE) ×2 IMPLANT
ELECT REM PT RETURN 9FT ADLT (ELECTROSURGICAL) ×2
ELECTRODE REM PT RTRN 9FT ADLT (ELECTROSURGICAL) ×1 IMPLANT
EVACUATOR 1/8 PVC DRAIN (DRAIN) ×2 IMPLANT
GAUZE XEROFORM 1X8 LF (GAUZE/BANDAGES/DRESSINGS) ×4 IMPLANT
GLOVE BIO SURGEON STRL SZ 6.5 (GLOVE) ×4 IMPLANT
GLOVE BIO SURGEON STRL SZ7 (GLOVE) ×2 IMPLANT
GLOVE BIO SURGEON STRL SZ7.5 (GLOVE) ×2 IMPLANT
GLOVE BIOGEL PI IND STRL 6.5 (GLOVE) ×3 IMPLANT
GLOVE BIOGEL PI IND STRL 7.0 (GLOVE) ×1 IMPLANT
GLOVE BIOGEL PI IND STRL 8 (GLOVE) ×1 IMPLANT
GLOVE BIOGEL PI INDICATOR 6.5 (GLOVE) ×3
GLOVE BIOGEL PI INDICATOR 7.0 (GLOVE) ×1
GLOVE BIOGEL PI INDICATOR 8 (GLOVE) ×1
GOWN PREVENTION PLUS XLARGE (GOWN DISPOSABLE) ×2 IMPLANT
GOWN STRL NON-REIN LRG LVL3 (GOWN DISPOSABLE) ×4 IMPLANT
HANDPIECE INTERPULSE COAX TIP (DISPOSABLE) ×2
HOOD PEEL AWAY FACE SHEILD DIS (HOOD) ×6 IMPLANT
KIT BASIN OR (CUSTOM PROCEDURE TRAY) ×2 IMPLANT
KIT ROOM TURNOVER OR (KITS) ×2 IMPLANT
MANIFOLD NEPTUNE II (INSTRUMENTS) ×2 IMPLANT
NS IRRIG 1000ML POUR BTL (IV SOLUTION) ×2 IMPLANT
PACK TOTAL JOINT (CUSTOM PROCEDURE TRAY) ×2 IMPLANT
PAD ARMBOARD 7.5X6 YLW CONV (MISCELLANEOUS) ×4 IMPLANT
PADDING CAST COTTON 6X4 STRL (CAST SUPPLIES) ×2 IMPLANT
PADDING CAST SYN 6 (CAST SUPPLIES) ×1
PADDING CAST SYNTHETIC 6X4 NS (CAST SUPPLIES) ×1 IMPLANT
SET HNDPC FAN SPRY TIP SCT (DISPOSABLE) ×1 IMPLANT
SPONGE GAUZE 4X4 12PLY (GAUZE/BANDAGES/DRESSINGS) ×2 IMPLANT
STAPLER VISISTAT 35W (STAPLE) ×2 IMPLANT
SUCTION FRAZIER TIP 10 FR DISP (SUCTIONS) ×2 IMPLANT
SUT VIC AB 0 CTX 36 (SUTURE) ×2
SUT VIC AB 0 CTX36XBRD ANTBCTR (SUTURE) ×1 IMPLANT
SUT VIC AB 1 CTX 36 (SUTURE) ×2
SUT VIC AB 1 CTX36XBRD ANBCTR (SUTURE) ×1 IMPLANT
SUT VIC AB 2-0 CT1 27 (SUTURE) ×2
SUT VIC AB 2-0 CT1 TAPERPNT 27 (SUTURE) ×1 IMPLANT
TOWEL OR 17X24 6PK STRL BLUE (TOWEL DISPOSABLE) ×2 IMPLANT
TOWEL OR 17X26 10 PK STRL BLUE (TOWEL DISPOSABLE) ×2 IMPLANT
TRAY FOLEY CATH 14FR (SET/KITS/TRAYS/PACK) IMPLANT
WATER STERILE IRR 1000ML POUR (IV SOLUTION) ×6 IMPLANT

## 2011-07-16 NOTE — Preoperative (Signed)
Beta Blockers   Reason not to administer Beta Blockers:Not Applicable 

## 2011-07-16 NOTE — Op Note (Signed)
PATIENT ID:      Karla Rodriguez  MRN:     [id number] DOB/AGE:    Dec 19, 1965 / 46 y.o.       OPERATIVE REPORT    DATE OF PROCEDURE:  07/16/2011       PREOPERATIVE DIAGNOSIS:   DEGENERATIVE JOINT DISEASE LEFT KNEE      Estimated Body mass index is 36.90 kg/(m^2) as calculated from the following:   Height as of 04/28/11: 5\' 4" (1.626 m).   Weight as of 04/28/11: 215 lb(97.523 kg).                                                        POSTOPERATIVE DIAGNOSIS:   Degenerative Joint Disease Left Knee                                                                      PROCEDURE:  Procedure(s): TOTAL KNEE ARTHROPLASTY Using Depuy Sigma RP implants #3L Femur, #3Tibia, 10mm sigma RP bearing, 35 Patella     SURGEON: Tykia Mellone J    ASSISTANT:   Shirl Harris PA-C   (Present and scrubbed throughout the case, critical for assistance with exposure, retraction, instrumentation, and closure.)         ANESTHESIA: GET with Femoral Nerve Block  DRAINS: foley, 2 medium hemovac in knee   TOURNIQUET TIME:   COMPLICATIONS:  None     SPECIMENS: None   INDICATIONS FOR PROCEDURE: The patient has  DEGENERATIVE JOINT DISEASE LEFT KNEE, varus deformities, XR shows bone on bone arthritis. Patient has failed all conservative measures including anti-inflammatory medicines, narcotics, attempts at  exercise and weight loss, cortisone injections and viscosupplementation.  Risks and benefits of surgery have been discussed, questions answered.   DESCRIPTION OF PROCEDURE: The patient identified by armband, received  right femoral nerve block and IV antibiotics, in the holding area at [facility]. Patient taken to the operating room, appropriate anesthetic  monitors were attached General endotracheal anesthesia induced with  the patient in supine position, Foley catheter was inserted. Tourniquet  applied high to the operative thigh. Lateral post and foot positioner  applied to the table, the lower  extremity was then prepped and draped  in usual sterile fashion from the ankle to the tourniquet. Time-out procedure was performed. The limb was wrapped with an Esmarch bandage and the tourniquet inflated to 350 mmHg. We began the operation by making the anterior midline incision starting at handbreadth above the patella going over the patella 1 cm medial to and  4 cm distal to the tibial tubercle. Small bleeders in the skin and the  subcutaneous tissue identified and cauterized. Transverse retinaculum was incised and reflected medially and a medial parapatellar arthrotomy was accomplished. the patella was everted and theprepatellar fat pad resected. The superficial medial collateral  ligament was then elevated from anterior to posterior along the proximal  flare of the tibia and anterior half of the menisci resected. The knee was hyperflexed exposing bone on bone arthritis. Peripheral and notch osteophytes as well as the cruciate ligaments were then resected. We  continued to  work our way around posteriorly along the proximal tibia, and externally  rotated the tibia subluxing it out from underneath the femur. A McHale  retractor was placed through the notch and a lateral Hohmann retractor  placed, and we then drilled through the proximal tibia in line with the  axis of the tibia followed by an intramedullary guide rod and 2-degree  posterior slope cutting guide. We did not encounter the metaphyseal staples were placed in the tibia for her Alben Deeds to repair 30 years ago. The tibial cutting guide was pinned into place  allowing resection of 8 mm of bone medially and about 9 mm of bone  laterally because of her varus deformity. Satisfied with the tibial resection, we then  entered the distal femur 2 mm anterior to the PCL origin with the  intramedullary guide rod and applied the distal femoral cutting guide  set at 11mm, with 5 degrees of valgus. This was pinned along the  epicondylar axis. At this  point, the distal femoral cut was accomplished without difficulty. We then sized for a #3L femoral component and pinned the guide in 0 degrees of external rotation.The chamfer cutting guide was pinned into place. The anterior, posterior, and chamfer cuts were accomplished without difficulty followed by  the Sigma RP box cutting guide and the box cut. We also removed posterior osteophytes from the posterior femoral condyles. At this  time, the knee was brought into full extension. We checked our  extension and flexion gaps and found them symmetric at 10mm.  The patella thickness measured at 23 mm. We set the cutting guide at 14 and removed the posterior 9.5-10 mm  of the patella sized for 35 button and drilled the lollipop. The knee  was then once again hyperflexed exposing the proximal tibia. We sized for a #3 tibial base plate, applied the smokestack and the conical reamer followed by the the Delta fin keel punch. We then hammered into place the Sigma RP trial femoral component, inserted a 10-mm trial bearing, trial patellar button, and took the knee through range of motion from 0-130 degrees. No thumb pressure was required for patellar  tracking. At this point, all trial components were removed, a double batch of DePuy HV cement with 1500 mg of Zinacef was mixed and applied to all bony metallic mating surfaces except for the posterior condyles of the femur itself. In order, we  hammered into place the tibial tray and removed excess cement, the femoral component and removed excess cement, a 10-mm Sigma RP bearing  was inserted, and the knee brought to full extension with compression.  The patellar button was clamped into place, and excess cement  removed. While the cement cured the wound was irrigated out with normal saline solution pulse lavage, and medium Hemovac drains were placed from an anterolateral  approach. Ligament stability and patellar tracking were checked and found to be excellent. The  parapatellar arthrotomy was closed with  running #1 Vicryl suture. The subcutaneous tissue with 0 and 2-0 undyed  Vicryl suture, and the skin with skin staples. A dressing of Xeroform,  4 x 4, dressing sponges, Webril, and Ace wrap applied. The patient  awakened, extubated, and taken to recovery room without difficulty.   Hiroshi Krummel J 07/16/2011, 10:50 AM

## 2011-07-16 NOTE — Progress Notes (Signed)
Orthopedic Tech Progress Note Patient Details:  Karla Rodriguez 1965-11-06 [id number]  CPM Left Knee CPM Left Knee: On Left Knee Flexion (Degrees): 30  Left Knee Extension (Degrees): 0  Additional Comments: trapeze   [place] 07/16/2011, 12:55 PM

## 2011-07-16 NOTE — Interval H&P Note (Signed)
History and Physical Interval Note:  07/16/2011 9:01 AM  Karla Rodriguez  has presented today for surgery, with the diagnosis of DEGENERATIVE JOINT DISEASE LEFT KNEE  The various methods of treatment have been discussed with the patient and family. After consideration of risks, benefits and other options for treatment, the patient has consented to  Procedure(s) (LRB): TOTAL KNEE ARTHROPLASTY (Left) as a surgical intervention .  The patients' history has been reviewed, patient examined, no change in status, stable for surgery.  I have reviewed the patients' chart and labs.  Questions were answered to the patient's satisfaction.     Nestor Lewandowsky

## 2011-07-16 NOTE — Anesthesia Preprocedure Evaluation (Addendum)
Anesthesia Evaluation  Patient identified by MRN, date of birth, ID band Patient awake    Reviewed: Allergy & Precautions, H&P , NPO status , Patient's Chart, lab work & pertinent test results  Airway Mallampati: II TM Distance: >3 FB Neck ROM: full    Dental  (+) Dental Advisory Given   Pulmonary asthma , sleep apnea , Current Smoker,          Cardiovascular hypertension, Rhythm:regular Rate:Normal     Neuro/Psych PSYCHIATRIC DISORDERS Anxiety Depression Bipolar Disorder    GI/Hepatic hiatal hernia, GERD-  ,  Endo/Other  Diabetes mellitus-, Poorly Controlled, Type 1, Insulin Dependent  Renal/GU      Musculoskeletal   Abdominal   Peds  Hematology   Anesthesia Other Findings   Reproductive/Obstetrics                          Anesthesia Physical Anesthesia Plan  ASA: III  Anesthesia Plan: General   Post-op Pain Management:    Induction: Intravenous  Airway Management Planned: LMA and Oral ETT  Additional Equipment:   Intra-op Plan:   Post-operative Plan: Extubation in OR  Informed Consent: I have reviewed the patients History and Physical, chart, labs and discussed the procedure including the risks, benefits and alternatives for the proposed anesthesia with the patient or authorized representative who has indicated his/her understanding and acceptance.     Plan Discussed with: CRNA, Anesthesiologist and Surgeon  Anesthesia Plan Comments:         Anesthesia Quick Evaluation

## 2011-07-16 NOTE — Transfer of Care (Signed)
Immediate Anesthesia Transfer of Care Note  Patient: Karla Rodriguez  Procedure(s) Performed: Procedure(s) (LRB): TOTAL KNEE ARTHROPLASTY (Left)  Patient Location: PACU  Anesthesia Type: GA combined with regional for post-op pain  Level of Consciousness: awake, alert , oriented and patient cooperative  Airway & Oxygen Therapy: Patient Spontanous Breathing and Patient connected to nasal cannula oxygen  Post-op Assessment: Report given to PACU RN, Post -op Vital signs reviewed and stable and Patient moving all extremities X 4  Post vital signs: Reviewed and stable  Complications: No apparent anesthesia complications

## 2011-07-16 NOTE — Anesthesia Postprocedure Evaluation (Signed)
  Anesthesia Post-op Note  Patient: Karla Rodriguez  Procedure(s) Performed: Procedure(s) (LRB): TOTAL KNEE ARTHROPLASTY (Left)  Patient Location: PACU  Anesthesia Type: GA combined with regional for post-op pain  Level of Consciousness: awake, alert , oriented and patient cooperative  Airway and Oxygen Therapy: Patient Spontanous Breathing and Patient connected to nasal cannula oxygen  Post-op Pain: mild  Post-op Assessment: Post-op Vital signs reviewed, Patient's Cardiovascular Status Stable, Respiratory Function Stable, Patent Airway, No signs of Nausea or vomiting and Pain level controlled  Post-op Vital Signs: stable  Complications: No apparent anesthesia complications

## 2011-07-16 NOTE — Anesthesia Procedure Notes (Addendum)
Anesthesia Regional Block:  Femoral nerve block  Pre-Anesthetic Checklist: ,, timeout performed, Correct Patient, Correct Site, Correct Laterality, Correct Procedure, Correct Position, site marked, Risks and benefits discussed,  Surgical consent,  Pre-op evaluation,  At surgeon's request and post-op pain management  Laterality: Left  Prep: Maximum Sterile Barrier Precautions used, chloraprep and alcohol swabs       Needles:  Injection technique: Single-shot  Needle Type: Stimulator Needle - 80        Needle insertion depth: 7 cm   Additional Needles:  Procedures: nerve stimulator Femoral nerve block  Nerve Stimulator or Paresthesia:  Response: 0.5 mA, 0.1 ms, 7 cm  Additional Responses:   Narrative:  Start time: 07/16/2011 8:40 AM End time: 07/16/2011 8:45 AM Injection made incrementally with aspirations every 5 mL.  Performed by: Personally  Anesthesiologist: Maren Beach MD  Additional Notes: 25cc 0.5% Marcaine w/ epi w/o difficulty or discomfort. GES  Femoral nerve block Procedure Name: Intubation Date/Time: 07/16/2011 9:14 AM Performed by: Rogelia Boga Pre-anesthesia Checklist: Patient identified, Emergency Drugs available, Suction available, Patient being monitored and Timeout performed Patient Re-evaluated:Patient Re-evaluated prior to inductionOxygen Delivery Method: Circle system utilized Preoxygenation: Pre-oxygenation with 100% oxygen Intubation Type: IV induction Ventilation: Mask ventilation without difficulty and Oral airway inserted - appropriate to patient size Laryngoscope Size: Mac and 4 Grade View: Grade I Tube type: Oral Tube size: 7.5 mm Number of attempts: 1 Airway Equipment and Method: Stylet Placement Confirmation: ETT inserted through vocal cords under direct vision,  positive ETCO2 and breath sounds checked- equal and bilateral Secured at: 21 cm Tube secured with: Tape Dental Injury: Teeth and Oropharynx as per pre-operative  assessment

## 2011-07-17 LAB — GLUCOSE, CAPILLARY
Glucose-Capillary: 228 mg/dL — ABNORMAL HIGH (ref 70–99)
Glucose-Capillary: 189 mg/dL — ABNORMAL HIGH (ref 70–99)
Glucose-Capillary: 119 mg/dL — ABNORMAL HIGH (ref 70–99)
Glucose-Capillary: 379 mg/dL — ABNORMAL HIGH (ref 70–99)

## 2011-07-17 LAB — BASIC METABOLIC PANEL
CO2: 20 mEq/L (ref 19–32)
Chloride: 99 mEq/L (ref 96–112)
Potassium: 4.1 mEq/L (ref 3.5–5.1)
Sodium: 133 mEq/L — ABNORMAL LOW (ref 135–145)

## 2011-07-17 LAB — CBC
MCV: 83.8 fL (ref 78.0–100.0)
Platelets: 218 10*3/uL (ref 150–400)
RBC: 4.44 MIL/uL (ref 3.87–5.11)
WBC: 9 10*3/uL (ref 4.0–10.5)

## 2011-07-17 NOTE — Progress Notes (Signed)
Physical Therapy Treatment Patient Details Name: Karla Rodriguez MRN: [id number] DOB: 1965-02-10 Today's Date: 07/17/2011 Time: 7829-5621 PT Time Calculation (min): 27 min  PT Assessment / Plan / Recommendation Comments on Treatment Session  Pt just back to bed after ambulating to BR with nursing therefore OOB defered.  Pt motivated to increase mobility and knee ROM.  Pt placed in CPM after treatment at 0-40 degrees.    Follow Up Recommendations  Home health PT    Barriers to Discharge None      Equipment Recommendations  None recommended by PT    Recommendations for Other Services    Frequency 7X/week   Plan Discharge plan remains appropriate;Frequency remains appropriate    Precautions / Restrictions Precautions Precautions: Knee Restrictions Weight Bearing Restrictions: Yes LLE Weight Bearing: Weight bearing as tolerated   Pertinent Vitals/Pain C/o 6/10  L knee with exercise and 1/10 at rest-premedicated.         Exercises Total Joint Exercises Ankle Circles/Pumps: AROM;Both;10 reps Quad Sets: AROM;Left;10 reps;Supine Short Arc Quad: AAROM;Left;10 reps;Supine Heel Slides: AAROM;Left;10 reps;Supine Hip ABduction/ADduction: AROM;Left;10 reps;Supine Straight Leg Raises: AAROM;Left;10 reps;Supine Goniometric ROM: 5-45 L knee AAROM    Pt will Perform Home Exercise Program: with supervision, verbal cues required/provided PT Goal: Perform Home Exercise Program - Progress: Progressing toward goal  Visit Information  Last PT Received On: 07/17/11 Assistance Needed: +1    Subjective Data  Subjective: "I'm frustrated that I can't do it."  HY:QMVH ex Patient Stated Goal: home   Cognition  Overall Cognitive Status: Appears within functional limits for tasks assessed/performed Arousal/Alertness: Awake/alert Orientation Level: Oriented X4 / Intact Behavior During Session: [facility] for tasks performed    Balance     End of Session PT - End of Session Equipment Utilized During  Treatment: Gait belt Activity Tolerance: Patient tolerated treatment well Patient left: in bed;in CPM;with call bell/phone within reach Nurse Communication: Mobility status;Patient requests pain meds    Newell Coral 07/17/2011, 2:04 PM  Newell Coral, PTA Acute Rehab [phone] (office)

## 2011-07-17 NOTE — Progress Notes (Signed)
PATIENT ID: Karla Rodriguez  MRN: [id number]  DOB/AGE:  [birth year] / 46 y.o.  1 Day Post-Op Procedure(s) (LRB): TOTAL KNEE ARTHROPLASTY (Left)  Subjective: Pain is moderate.  No c/o chest pain or SOB.      Objective: Vital signs in last 24 hours: Temp:  [98.1 F (36.7 C)-99.7 F (37.6 C)] 99.1 F (37.3 C) (06/08 0600) Pulse Rate:  [75-97] 97  (06/08 0600) Resp:  [15-21] 18  (06/08 0600) BP: (122-143)/(79-85) 122/80 mmHg (06/08 0600) SpO2:  [93 %-100 %] 93 % (06/08 0600) Weight:  [94.575 kg (208 lb 8 oz)] 94.575 kg (208 lb 8 oz) (06/08 0535)  Intake/Output from previous day: 06/07 0701 - 06/08 0700 In: 2500 [I.V.:2500] Out: 5700 [Urine:5350; Drains:150; Blood:200] Intake/Output this shift:     Basename 07/17/11 0505  HGB 13.0    Basename 07/17/11 0505  WBC 9.0  RBC 4.44  HCT 37.2  PLT 218   No results found for this basename: NA:2,K:2,CL:2,CO2:2,BUN:2,CREATININE:2,GLUCOSE:2,CALCIUM:2 in the last 72 hours No results found for this basename: LABPT:2,INR:2 in the last 72 hours  Physical Exam: Neurovascular intact Sensation intact distally Intact pulses distally Dorsiflexion/Plantar flexion intact Incision: dressing C/D/I Drain d/c'd  Assessment/Plan: 1 Day Post-Op Procedure(s) (LRB): TOTAL KNEE ARTHROPLASTY (Left)   Advance diet Up with therapy Weight Bearing as Tolerated (WBAT)  VTE prophylaxis: ECASA BID + SCDs    Tracie Lindbloom WILLIAM 07/17/2011, 7:44 AM

## 2011-07-17 NOTE — Evaluation (Signed)
Physical Therapy Evaluation Patient Details Name: Karla Rodriguez MRN: [id number] DOB: 07-19-1965 Today's Date: 07/17/2011 Time: 4098-1191 PT Time Calculation (min): 20 min  PT Assessment / Plan / Recommendation Clinical Impression  Pt is a 46 y.o. female who underwent R TKA 07-16-11.  Skilled PT intervention indicated to obtain max functional mobility level and provide education for safe d/c home.    PT Assessment  Patient needs continued PT services    Follow Up Recommendations  Home health PT    Barriers to Discharge None      lEquipment Recommendations  None recommended by PT    Recommendations for Other Services     Frequency 7X/week    Precautions / Restrictions Precautions Precautions: Knee Restrictions LLE Weight Bearing: Weight bearing as tolerated   Pertinent Vitals/Pain 8/10      Mobility  Bed Mobility Bed Mobility: Supine to Sit Supine to Sit: 4: Min assist Details for Bed Mobility Assistance: verbal cues for sequencing Transfers Transfers: Stand to Sit;Sit to Stand;Stand Pivot Transfers Sit to Stand: 4: Min assist;From bed;With upper extremity assist Stand to Sit: 4: Min assist;To chair/3-in-1;With armrests Stand Pivot Transfers: 4: Min assist Details for Transfer Assistance: verbal cues for hand placement, sequencing Ambulation/Gait Ambulation/Gait Assistance: 4: Min guard Ambulation Distance (Feet): 30 Feet Assistive device: Rolling walker Ambulation/Gait Assistance Details: verbal cues for RW management, sequencing Gait Pattern: Step-to pattern;Antalgic Gait velocity: decreased    Exercises Total Joint Exercises Ankle Circles/Pumps: AROM;Both;10 reps Goniometric ROM: 5-45 L knee AAROM   PT Diagnosis: Difficulty walking;Acute pain  PT Problem List: Decreased strength;Decreased range of motion;Decreased activity tolerance;Decreased mobility;Pain PT Treatment Interventions: DME instruction;Gait training;Stair training;Functional mobility  training;Therapeutic activities;Therapeutic exercise;Patient/family education   PT Goals Acute Rehab PT Goals PT Goal Formulation: With patient Time For Goal Achievement: 07/24/11 Potential to Achieve Goals: Good Pt will go Supine/Side to Sit: with modified independence;with HOB 0 degrees PT Goal: Supine/Side to Sit - Progress: Goal set today Pt will go Sit to Supine/Side: with modified independence;with HOB 0 degrees PT Goal: Sit to Supine/Side - Progress: Goal set today Pt will go Sit to Stand: with modified independence;with upper extremity assist PT Goal: Sit to Stand - Progress: Goal set today Pt will go Stand to Sit: with modified independence;with upper extremity assist PT Goal: Stand to Sit - Progress: Goal set today Pt will Transfer Bed to Chair/Chair to Bed: with modified independence PT Transfer Goal: Bed to Chair/Chair to Bed - Progress: Goal set today Pt will Ambulate: >150 feet;with rolling walker PT Goal: Ambulate - Progress: Goal set today Pt will Go Up / Down Stairs: 3-5 stairs;with min assist;with least restrictive assistive device PT Goal: Up/Down Stairs - Progress: Goal set today Pt will Perform Home Exercise Program: with supervision, verbal cues required/provided PT Goal: Perform Home Exercise Program - Progress: Goal set today  Visit Information  Last PT Received On: 07/17/11 Assistance Needed: +1    Subjective Data  Subjective: "I just want to get this over with." Patient Stated Goal: home   Prior Functioning  Home Living Lives With: Family Available Help at Discharge: Family Type of Home: House Home Access: Stairs to enter Secretary/administrator of Steps: 2 Entrance Stairs-Rails: Right Home Layout: Multi-level;Bed/bath upstairs Alternate Level Stairs-Number of Steps: 10 Alternate Level Stairs-Rails: Right Home Adaptive Equipment: Raised toilet seat with rails;Walker - four wheeled Prior Function Level of Independence: Independent Able to Take  Stairs?: Yes Driving: Yes Communication Communication: No difficulties    Cognition  Overall Cognitive Status:  Appears within functional limits for tasks assessed/performed Arousal/Alertness: Awake/alert Orientation Level: Oriented X4 / Intact Behavior During Session: [facility] for tasks performed    Extremity/Trunk Assessment     Balance    End of Session PT - End of Session Equipment Utilized During Treatment: Gait belt Activity Tolerance: Patient tolerated treatment well Patient left: in chair;with call bell/phone within reach;with family/visitor present Nurse Communication: Mobility status;Patient requests pain meds   Ilda Foil 07/17/2011, 12:01 PM  Aida Raider, PT  Office # [phone] Pager [phone]

## 2011-07-18 LAB — GLUCOSE, CAPILLARY
Glucose-Capillary: 220 mg/dL — ABNORMAL HIGH (ref 70–99)
Glucose-Capillary: 317 mg/dL — ABNORMAL HIGH (ref 70–99)

## 2011-07-18 LAB — CBC
MCHC: 34.5 g/dL (ref 30.0–36.0)
Platelets: 203 10*3/uL (ref 150–400)
RDW: 14.3 % (ref 11.5–15.5)
WBC: 9.2 10*3/uL (ref 4.0–10.5)

## 2011-07-18 NOTE — Progress Notes (Signed)
PATIENT ID: Karla Rodriguez  MRN: [id number]  DOB/AGE:  [birth year] / 46 y.o.  2 Days Post-Op Procedure(s) (LRB): TOTAL KNEE ARTHROPLASTY (Left)  Subjective: Pain is improving, well controlled over night.  No c/o chest pain or SOB.   Did well with PT.  Objective: Vital signs in last 24 hours: Temp:  [98.3 F (36.8 C)-98.6 F (37 C)] 98.3 F (36.8 C) (06/09 0700) Pulse Rate:  [76-82] 76  (06/09 0700) Resp:  [16-18] 16  (06/09 0700) BP: (94-132)/(56-80) 132/80 mmHg (06/09 0700) SpO2:  [93 %-97 %] 93 % (06/09 0700)  Intake/Output from previous day: 06/08 0701 - 06/09 0700 In: 1200 [P.O.:1200] Out: 1300 [Urine:1300] Intake/Output this shift:     Basename 07/18/11 0545 07/17/11 0505  HGB 12.1 13.0    Basename 07/18/11 0545 07/17/11 0505  WBC 9.2 9.0  RBC 4.18 4.44  HCT 35.1* 37.2  PLT 203 218    Basename 07/17/11 0505  NA 133*  K 4.1  CL 99  CO2 20  BUN 6  CREATININE 0.53  GLUCOSE 188*  CALCIUM 8.6   No results found for this basename: LABPT:2,INR:2 in the last 72 hours  Physical Exam: Neurovascular intact Sensation intact distally Intact pulses distally Dorsiflexion/Plantar flexion intact Incision: dressing C/D/I  Assessment/Plan: 2 Days Post-Op Procedure(s) (LRB): TOTAL KNEE ARTHROPLASTY (Left)   Up with therapy Plan for discharge tomorrow likely Weight Bearing as Tolerated (WBAT)  VTE prophylaxis: ECASA and SCDs Cont CPM   Aasim Restivo WILLIAM 07/18/2011, 7:39 AM

## 2011-07-18 NOTE — Progress Notes (Signed)
Occupational Therapy Evaluation Patient Details Name: Karla Rodriguez MRN: [id number] DOB: 03-29-1965 Today's Date: 07/18/2011 Time: 8295-6213 OT Time Calculation (min): 22 min  OT Assessment / Plan / Recommendation Clinical Impression  46 yo s/p L TKA. PT will benefit from skilled Ot services secondary to deficits listed below to max indepa nd safety with ADL and functional mobility for ADL to facilitate D/C home with intermittent S of family.    OT Assessment  Patient needs continued OT Services    Follow Up Recommendations  No OT follow up    Barriers to Discharge None    Equipment Recommendations  Tub/shower bench    Recommendations for Other Services  none  Frequency  Min 2X/week    Precautions / Restrictions Precautions Precautions: Knee Restrictions LLE Weight Bearing: Weight bearing as tolerated   Pertinent Vitals/Pain 4    ADL  Eating/Feeding: Performed;Independent Grooming: Simulated;Set up Upper Body Bathing: Simulated;Set up Lower Body Bathing: Simulated;Moderate assistance Upper Body Dressing: Simulated;Set up Lower Body Dressing: Simulated;Moderate assistance Toilet Transfer: Simulated;Min guard Transfers/Ambulation Related to ADLs: minguard ADL Comments: Demonstrated use of AE to increase independence with LB ADL. Also educated family on tub bnech trasfer.     OT Diagnosis: Generalized weakness;Acute pain  OT Problem List: Decreased strength;Decreased range of motion;Pain OT Treatment Interventions: Self-care/ADL training;DME and/or AE instruction;Therapeutic activities;Patient/family education   OT Goals Acute Rehab OT Goals OT Goal Formulation: With patient Time For Goal Achievement: 07/25/11 Potential to Achieve Goals: Good ADL Goals Pt Will Perform Lower Body Dressing: with supervision;with caregiver independent in assisting;with adaptive equipment;with cueing (comment type and amount);Sit to stand from chair ADL Goal: Lower Body Dressing -  Progress: Goal set today Pt Will Perform Tub/Shower Transfer: with supervision;with caregiver independent in assisting;Tub transfer;Ambulation;Transfer tub bench ADL Goal: Tub/Shower Transfer - Progress: Goal set today  Visit Information  Last OT Received On: 07/18/11 Assistance Needed: +1    Subjective Data  Subjective: Someone will be with me for the first week.   Prior Functioning  Home Living Lives With: Family Available Help at Discharge: Family Type of Home: House Home Access: Stairs to enter Secretary/administrator of Steps: 2 Entrance Stairs-Rails: Right Home Layout: Multi-level;Bed/bath upstairs Alternate Level Stairs-Number of Steps: 10 Alternate Level Stairs-Rails: Right Bathroom Shower/Tub: Engineer, manufacturing systems: Standard Bathroom Accessibility: Yes How Accessible: Accessible via walker Home Adaptive Equipment: Bedside commode/3-in-1;Walker - rolling Prior Function Level of Independence: Independent Able to Take Stairs?: Yes Driving: Yes Communication Communication: No difficulties Dominant Hand: Right    Cognition       Extremity/Trunk Assessment Left Upper Extremity Assessment LUE ROM/Strength/Tone: [facility] for tasks assessed Right Lower Extremity Assessment RLE ROM/Strength/Tone: [facility] for tasks assessed                End of Session     Lillybeth Tal,HILLARY 07/18/2011, 2:17 PM [facility], OTR/L  [phone] 07/18/2011

## 2011-07-18 NOTE — Progress Notes (Signed)
Physical Therapy Treatment Patient Details Name: Karla Rodriguez MRN: [id number] DOB: 08-10-1965 Today's Date: 07/18/2011 Time: 4098-1191 PT Time Calculation (min): 24 min  PT Assessment / Plan / Recommendation Comments on Treatment Session  Pt with excellent progress.  D/C is planned for tomorrow.    Follow Up Recommendations  Home health PT    Barriers to Discharge        Equipment Recommendations  Tub/shower bench    Recommendations for Other Services    Frequency 7X/week   Plan Discharge plan remains appropriate;Frequency remains appropriate    Precautions / Restrictions Precautions Precautions: Knee Precaution Booklet Issued: Yes (comment) Restrictions LLE Weight Bearing: Weight bearing as tolerated        Mobility  Bed Mobility Supine to Sit: 6: Modified independent (Device/Increase time);HOB flat Sit to Supine: 6: Modified independent (Device/Increase time);HOB flat Transfers Sit to Stand: 5: Supervision;From bed;With upper extremity assist Stand to Sit: 5: Supervision;To bed;With upper extremity assist Stand Pivot Transfers: 5: Supervision Ambulation/Gait Ambulation/Gait Assistance: 5: Supervision Ambulation Distance (Feet): 200 Feet Assistive device: Rolling walker Gait Pattern: Step-through pattern    Exercises Total Joint Exercises Ankle Circles/Pumps: AROM;Both;10 reps;Supine Quad Sets: AROM;Left;10 reps;Supine Short Arc Quad: AAROM;Left;10 reps;Supine Heel Slides: AAROM;Left;10 reps;Supine Hip ABduction/ADduction: AROM;10 reps;Left;Supine Straight Leg Raises: AAROM;Left;10 reps;Supine Goniometric ROM: 0-50 L knee AAROM   PT Diagnosis:    PT Problem List:   PT Treatment Interventions:     PT Goals Acute Rehab PT Goals PT Goal: Supine/Side to Sit - Progress: Met PT Goal: Sit to Supine/Side - Progress: Met PT Goal: Sit to Stand - Progress: Progressing toward goal PT Goal: Stand to Sit - Progress: Progressing toward goal PT Transfer Goal: Bed to  Chair/Chair to Bed - Progress: Progressing toward goal Pt will Ambulate: >150 feet;with rolling walker;with modified independence PT Goal: Ambulate - Progress: Progressing toward goal Pt will Go Up / Down Stairs: 3-5 stairs;with supervision;with rail(s) PT Goal: Up/Down Stairs - Progress: Progressing toward goal PT Goal: Perform Home Exercise Program - Progress: Progressing toward goal  Visit Information  Last PT Received On: 07/18/11 Assistance Needed: +1    Subjective Data      Cognition  Overall Cognitive Status: Appears within functional limits for tasks assessed/performed Arousal/Alertness: Awake/alert Orientation Level: Oriented X4 / Intact Behavior During Session: [facility] for tasks performed    Balance     End of Session PT - End of Session Equipment Utilized During Treatment: Gait belt Activity Tolerance: Patient tolerated treatment well Patient left: in bed;with call bell/phone within reach;in CPM CPM Left Knee CPM Left Knee: On Left Knee Flexion (Degrees): 45  Left Knee Extension (Degrees): 0     Ilda Foil 07/18/2011, 2:57 PM  Aida Raider, PT  Office # [phone] Pager [phone]

## 2011-07-18 NOTE — Progress Notes (Signed)
Physical Therapy Treatment Patient Details Name: Karla Rodriguez MRN: [id number] DOB: 30-Jun-1965 Today's Date: 07/18/2011 Time: 1027-2536 PT Time Calculation (min): 31 min  PT Assessment / Plan / Recommendation Comments on Treatment Session       Follow Up Recommendations  Home health PT (Pt requesting [facility]    Barriers to Discharge        Equipment Recommendations  None recommended by PT    Recommendations for Other Services    Frequency 7X/week   Plan Discharge plan remains appropriate;Frequency remains appropriate    Precautions / Restrictions Precautions Precautions: Knee Restrictions LLE Weight Bearing: Weight bearing as tolerated        Mobility  Bed Mobility Bed Mobility: Sit to Supine Supine to Sit: 5: Supervision Sit to Supine: 4: Min guard Details for Bed Mobility Assistance: verbal cues for sequencing Transfers Sit to Stand: 4: Min guard;From bed;With upper extremity assist Stand to Sit: 4: Min guard;To bed;To chair/3-in-1;With upper extremity assist;With armrests Stand Pivot Transfers: 4: Min guard;With armrests Details for Transfer Assistance: verbal cues for hand placement, safety Ambulation/Gait Ambulation/Gait Assistance: 4: Min guard Ambulation Distance (Feet): 150 Feet Assistive device: Rolling walker Gait Pattern: Antalgic;Step-to pattern Stairs: Yes Stairs Assistance: 4: Min assist Stairs Assistance Details (indicate cue type and reason): verbal/tactile cues for sequencing, safety Stair Management Technique: One rail Left;Sideways Number of Stairs: 2     Exercises Total Joint Exercises Ankle Circles/Pumps: AROM;Both;10 reps;Supine Quad Sets: AROM;Left;10 reps;Supine Short Arc Quad: AAROM;Left;10 reps;Supine Heel Slides: AAROM;Left;10 reps;Supine Hip ABduction/ADduction: AROM;Left;10 reps;Supine Straight Leg Raises: AAROM;Left;10 reps;Supine   PT Diagnosis:    PT Problem List:   PT Treatment Interventions:     PT Goals Acute Rehab  PT Goals PT Goal: Supine/Side to Sit - Progress: Progressing toward goal PT Goal: Sit to Supine/Side - Progress: Progressing toward goal PT Goal: Sit to Stand - Progress: Progressing toward goal PT Goal: Stand to Sit - Progress: Progressing toward goal PT Transfer Goal: Bed to Chair/Chair to Bed - Progress: Progressing toward goal PT Goal: Ambulate - Progress: Progressing toward goal PT Goal: Up/Down Stairs - Progress: Progressing toward goal PT Goal: Perform Home Exercise Program - Progress: Progressing toward goal  Visit Information  Last PT Received On: 07/18/11 Assistance Needed: +1    Subjective Data  Subjective: "I am feeling better." Patient Stated Goal: independence   Cognition  Overall Cognitive Status: Appears within functional limits for tasks assessed/performed Arousal/Alertness: Awake/alert Orientation Level: Oriented X4 / Intact Behavior During Session: [facility] for tasks performed    Balance     End of Session PT - End of Session Equipment Utilized During Treatment: Gait belt Activity Tolerance: Patient tolerated treatment well Patient left: in chair;with family/visitor present;with call bell/phone within reach    Ilda Foil 07/18/2011, 10:10 AM  Aida Raider, PT  Office # [phone] Pager [phone]

## 2011-07-19 ENCOUNTER — Encounter (HOSPITAL_COMMUNITY): Payer: Self-pay | Admitting: Orthopedic Surgery

## 2011-07-19 DIAGNOSIS — M1712 Unilateral primary osteoarthritis, left knee: Secondary | ICD-10-CM | POA: Diagnosis present

## 2011-07-19 LAB — CBC
HCT: 31.8 % — ABNORMAL LOW (ref 36.0–46.0)
MCHC: 34.3 g/dL (ref 30.0–36.0)
MCV: 84.6 fL (ref 78.0–100.0)
RDW: 14.4 % (ref 11.5–15.5)

## 2011-07-19 LAB — GLUCOSE, CAPILLARY: Glucose-Capillary: 303 mg/dL — ABNORMAL HIGH (ref 70–99)

## 2011-07-19 MED ORDER — OXYCODONE HCL 5 MG PO TABS: 5.0000 mg | ORAL_TABLET | ORAL | Status: AC | PRN

## 2011-07-19 MED ORDER — ASPIRIN 325 MG PO TBEC: 325.0000 mg | DELAYED_RELEASE_TABLET | Freq: Two times a day (BID) | ORAL | Status: AC

## 2011-07-19 MED ORDER — METHOCARBAMOL 500 MG PO TABS: 500.0000 mg | ORAL_TABLET | Freq: Four times a day (QID) | ORAL | Status: AC | PRN

## 2011-07-19 NOTE — Progress Notes (Signed)
Physical Therapy Treatment Patient Details Name: Karla Rodriguez MRN: [id number] DOB: [birth year] Today's Date: 07/19/2011 Time: 4098-1191 PT Time Calculation (min): 25 min  PT Assessment / Plan / Recommendation Comments on Treatment Session  Pt continues to progress very well with mobility.  Eager to d/c home.  Pt mobilizing well enough to safely d/c home when MD feels appropriate.      Follow Up Recommendations  Home health PT    Barriers to Discharge        Equipment Recommendations  Tub/shower bench    Recommendations for Other Services    Frequency 7X/week   Plan Discharge plan remains appropriate;Frequency remains appropriate    Precautions / Restrictions Precautions Precautions: Knee Restrictions LLE Weight Bearing: Weight bearing as tolerated   Pertinent Vitals/Pain 8/10 knee with activity.  Premedicated.      Mobility  Bed Mobility Bed Mobility: Supine to Sit;Sitting - Scoot to Edge of Bed;Sit to Supine Supine to Sit: 6: Modified independent (Device/Increase time);HOB flat Sitting - Scoot to Edge of Bed: 6: Modified independent (Device/Increase time) Sit to Supine: 6: Modified independent (Device/Increase time);HOB flat Details for Bed Mobility Assistance: No cues or physical (A) needed.   Transfers Transfers: Sit to Stand;Stand to Sit Sit to Stand: 6: Modified independent (Device/Increase time);With upper extremity assist;From bed Stand to Sit: 6: Modified independent (Device/Increase time);With upper extremity assist;To bed Ambulation/Gait Ambulation/Gait Assistance: 5: Supervision Ambulation Distance (Feet): 200 Feet Assistive device: Rolling walker Ambulation/Gait Assistance Details: Cues for increased heel strike, terminal knee extension during stance phase, & increased hip/knee flexion during swing phase.   Gait Pattern: Step-through pattern;Left flexed knee in stance Stairs: Yes Stairs Assistance: 4: Min guard Stairs Assistance Details (indicate cue type  and reason): Guarding for safety.  Knee buckled 1x while ascending steps but no physical (A) required.  Cues to slow down & take her time.   Stair Management Technique: One rail Left;Sideways Number of Stairs: 2  (2x's. ) Wheelchair Mobility Wheelchair Mobility: No    Exercises Total Joint Exercises Ankle Circles/Pumps: AROM;15 reps;Both Quad Sets: AROM;Both;10 reps Straight Leg Raises: AAROM;Left;10 reps Knee Flexion: AAROM;Left;10 reps;Seated Goniometric ROM: 0-60 L knee AAROM   PT Diagnosis:    PT Problem List:   PT Treatment Interventions:     PT Goals Acute Rehab PT Goals Time For Goal Achievement: 07/24/11 Potential to Achieve Goals: Good Pt will go Supine/Side to Sit: with modified independence;with HOB 0 degrees PT Goal: Supine/Side to Sit - Progress: Met PT Goal: Sit to Supine/Side - Progress: Met PT Goal: Sit to Stand - Progress: Met PT Goal: Stand to Sit - Progress: Met PT Goal: Ambulate - Progress: Progressing toward goal PT Goal: Up/Down Stairs - Progress: Progressing toward goal PT Goal: Perform Home Exercise Program - Progress: Progressing toward goal  Visit Information  Last PT Received On: 07/19/11 Assistance Needed: +1    Subjective Data      Cognition  Overall Cognitive Status: Appears within functional limits for tasks assessed/performed Arousal/Alertness: Awake/alert Orientation Level: Oriented X4 / Intact Behavior During Session: [facility] for tasks performed    Balance  Balance Balance Assessed: No  End of Session PT - End of Session Equipment Utilized During Treatment: Gait belt Activity Tolerance: Patient tolerated treatment well Patient left: in bed;with call bell/phone within reach;with family/visitor present    Lara Mulch 07/19/2011, 9:47 AM  Verdell Face, PTA [phone] 07/19/2011

## 2011-07-19 NOTE — Discharge Instructions (Signed)
Home Health to be provided by  [facility] -[phone] 

## 2011-07-19 NOTE — Progress Notes (Signed)
PATIENT ID: Karla Rodriguez  MRN: [id number]  DOB/AGE:  [birth year] / 45 y.o.  3 Days Post-Op Procedure(s) (LRB): TOTAL KNEE ARTHROPLASTY (Left)    PROGRESS NOTE Subjective: Patient is alert, oriented, no Nausea, no Vomiting, yes passing gas, no Bowel Movement. Taking PO well. Denies SOB, Chest or Calf Pain. Using Incentive Spirometer, PAS in place. Ambulating well with PT. Patient reports pain as moderate  .    Objective: Vital signs in last 24 hours: Filed Vitals:   07/18/11 0700 07/18/11 1200 07/18/11 2143 07/19/11 0601  BP: 132/80  113/74 106/66  Pulse: 76  92 100  Temp: 98.3 F (36.8 C)  99 F (37.2 C) 98.1 F (36.7 C)  TempSrc: Oral     Resp: 16 16 18 18   Height:      Weight:      SpO2: 93% 96% 96% 98%      Intake/Output from previous day: I/O last 3 completed shifts: In: 480 [P.O.:480] Out: 600 [Urine:600]   Intake/Output this shift:     LABORATORY DATA:  Basename 07/19/11 0708 07/19/11 0153 07/18/11 2146 07/18/11 0545 07/17/11 0505  WBC -- -- -- 9.2 9.0  HGB -- -- -- 12.1 13.0  HCT -- -- -- 35.1* 37.2  PLT -- -- -- 203 218  NA -- -- -- -- 133*  K -- -- -- -- 4.1  CL -- -- -- -- 99  CO2 -- -- -- -- 20  BUN -- -- -- -- 6  CREATININE -- -- -- -- 0.53  GLUCOSE -- -- -- -- 188*  GLUCAP 234* 303* 237* -- --  INR -- -- -- -- --  CALCIUM -- -- -- -- 8.6    Examination: Neurologically intact ABD soft Neurovascular intact Sensation intact distally Intact pulses distally Dorsiflexion/Plantar flexion intact Incision: scant drainage} - dressing changed today  Assessment:   3 Days Post-Op Procedure(s) (LRB): TOTAL KNEE ARTHROPLASTY (Left) ADDITIONAL DIAGNOSIS:  none  Plan: PT/OT WBAT, CPM 5/hrs day until ROM 0-90 degrees, then D/C CPM DVT Prophylaxis:  SCDx72hrs, ASA 325 mg BID x 2 weeks DISCHARGE PLAN: Home today DISCHARGE NEEDS: HHPT, HHRN, CPM, Walker and 3-in-1 comode seat     Karla Rodriguez M. 07/19/2011, 7:21 AM

## 2011-07-19 NOTE — Progress Notes (Signed)
CARE MANAGEMENT NOTE 07/19/2011  Patient:  RAKEISHA, NYCE   Account Number:  [id number]  Date Initiated:  07/16/2011  Documentation initiated by:  Ronny Flurry  Subjective/Objective Assessment:   TOTAL KNEE ARTHROPLASTY left     Action/Plan:   CM spoke with patient regarding HH needs. Choice offered. Patient preoperatively setup with [facility], no changes. Has CPM, RW and 3in1, requests tub bench. Faxed order to [employer].significant other will pickup on way home..   Anticipated DC Date:  07/19/2011   Anticipated DC Plan:  HOME W HOME HEALTH SERVICES      DC Planning Services  CM consult      [facility] Choice  HOME HEALTH  DURABLE MEDICAL EQUIPMENT   Choice offered to / List presented to:  C-1 Patient   DME arranged  TUB BENCH      DME agency  [facility]     HH arranged  HH-2 PT      [facility] agency  [facility]   Status of service:  Completed, signed off Medicare Important Message given?   (If response is "NO", the following Medicare IM given date fields will be blank) Date Medicare IM given:   Date Additional Medicare IM given:    Discharge Disposition:  HOME W HOME HEALTH SERVICES  Per UR Regulation:  Reviewed for med. necessity/level of care/duration of stay  If discussed at Long Length of Stay Meetings, dates discussed:    Comments:

## 2011-07-19 NOTE — Discharge Summary (Signed)
Patient ID: Karla Rodriguez MRN: [id number] DOB/AGE: 10-19-1965 46 y.o.  Admit date: 07/16/2011 Discharge date: 07/19/2011  Admission Diagnoses:  Active Problems:  Osteoarthritis of left knee   Discharge Diagnoses:  Same  Past Medical History  Diagnosis Date  . Leiomyoma of uterus   . Skin benign neoplasm   . Alcohol abuse   . IBS (irritable bowel syndrome)   . Shoulder pain   . GERD (gastroesophageal reflux disease)   . Allergic rhinitis   . Gastroparesis   . Pure hypercholesterolemia   . Hiatal hernia   . Hypertension     saw Dr. Tresa Endo- a couple of yrs. ago, stress test- wnl, no need for F/U  . Sleep apnea     CPAP, sleep study, long ago- uses CPAP q night   . Anemia     prior to hysterectomy   . Anxiety     h/o panic attacks   . Constipation     pt. tends to get constipated very easily, escpecially with pain meds   . Asthma     "allergy related and controlled"  . Type I diabetes mellitus 2008     insulin pump   . Osteoarthritis of knee     left  . OCD (obsessive compulsive disorder)   . Bipolar depression     "no psychotic features"  . Depression     BIPOLAR- Dr. Tomasa Rand    Surgeries: Procedure(s): TOTAL KNEE ARTHROPLASTY on 07/16/2011   Consultants:    Discharged Condition: Improved  Hospital Course: Karla Rodriguez is an 46 y.o. female who was admitted 07/16/2011 for operative treatment of<principal problem not specified>. Patient has severe unremitting pain that affects sleep, daily activities, and work/hobbies. After pre-op clearance the patient was taken to the operating room on 07/16/2011 and underwent  Procedure(s): TOTAL KNEE ARTHROPLASTY.    Patient was given perioperative antibiotics: Anti-infectives     Start     Dose/Rate Route Frequency Ordered Stop   07/16/11 1013   cefUROXime (ZINACEF) injection  Status:  Discontinued          As needed 07/16/11 1014 07/16/11 1115   07/15/11 1427   ceFAZolin (ANCEF) IVPB 2 g/50 mL premix        2 g 100  mL/hr over 30 Minutes Intravenous 60 min pre-op 07/15/11 1427 07/16/11 0914           Patient was given sequential compression devices, early ambulation, and chemoprophylaxis to prevent DVT.  Patient benefited maximally from hospital stay and there were no complications.    Recent vital signs: Patient Vitals for the past 24 hrs:  BP Temp Pulse Resp SpO2  07/19/11 0601 106/66 mmHg 98.1 F (36.7 C) 100  18  98 %  07/18/11 2143 113/74 mmHg 99 F (37.2 C) 92  18  96 %  07/18/11 1200 - - - 16  96 %     Recent laboratory studies:  Basename 07/18/11 0545 July 25, 2011 0505  WBC 9.2 9.0  HGB 12.1 13.0  HCT 35.1* 37.2  PLT 203 218  NA -- 133*  K -- 4.1  CL -- 99  CO2 -- 20  BUN -- 6  CREATININE -- 0.53  GLUCOSE -- 188*  INR -- --  CALCIUM -- 8.6     Discharge Medications:   Medication List  As of 07/19/2011  7:26 AM   TAKE these medications         ALPRAZolam 0.5 MG tablet   Commonly known as: Prudy Feeler  Take 0.25 mg by mouth at bedtime as needed. As needed for anxiety and to help sleep.      asenapine 5 MG Subl   Commonly known as: SAPHRIS   Place 5 mg under the tongue at bedtime.      aspirin 325 MG EC tablet   Take 1 tablet (325 mg total) by mouth 2 (two) times daily.      cetirizine 10 MG tablet   Commonly known as: ZYRTEC   Take 10 mg by mouth at bedtime.      escitalopram 20 MG tablet   Commonly known as: LEXAPRO   Take 20 mg by mouth every morning.      ibuprofen 200 MG tablet   Commonly known as: ADVIL,MOTRIN   Take 800 mg by mouth every 6 (six) hours as needed.      insulin lispro 100 UNIT/ML injection   Commonly known as: HUMALOG   Via Insulin pump      lamoTRIgine 150 MG tablet   Commonly known as: LAMICTAL   Take 300 mg by mouth at bedtime.      lisinopril 10 MG tablet   Commonly known as: PRINIVIL,ZESTRIL   Take 10 mg by mouth at bedtime.      methocarbamol 500 MG tablet   Commonly known as: ROBAXIN   Take 1 tablet (500 mg total) by mouth every  6 (six) hours as needed.      montelukast 10 MG tablet   Commonly known as: SINGULAIR   Take 10 mg by mouth at bedtime.      NEXIUM 40 MG capsule   Generic drug: esomeprazole   Take 40 mg by mouth at bedtime. Once daily      oxyCODONE 5 MG immediate release tablet   Commonly known as: Oxy IR/ROXICODONE   Take 1-2 tablets (5-10 mg total) by mouth every 3 (three) hours as needed.      PROVENTIL HFA 108 (90 BASE) MCG/ACT inhaler   Generic drug: albuterol   Inhale 2 puffs into the lungs every 6 (six) hours as needed. As needed for shortness of breath.      ramelteon 8 MG tablet   Commonly known as: ROZEREM   Take 8 mg by mouth at bedtime as needed. As needed for sleep.      rosuvastatin 20 MG tablet   Commonly known as: CRESTOR   Take 20 mg by mouth at bedtime.      VITAMIN D PO   Take 50,000 Units by mouth once a week. Sunday            Diagnostic Studies: Dg Chest 2 View  07/12/2011  *RADIOLOGY REPORT*  Clinical Data: Preop total knee replacement.  Asthma, hypertension, diabetes.  CHEST - 2 VIEW  Comparison: 06/23/2009  Findings: Peribronchial thickening. Heart and mediastinal contours are within normal limits.  No focal opacities or effusions.  No acute bony abnormality.  IMPRESSION: Bronchitic changes.  Original Report Authenticated By: Cyndie Chime, M.D.    Disposition: 01-Home or Self Care  Discharge Orders    Future Orders Please Complete By Expires   Increase activity slowly      Walker       May shower / Bathe      Driving Restrictions      Comments:   No driving for 2 weeks.   Change dressing (specify)      Comments:   Dressing change as needed.   Call MD for:  temperature >100.4  Call MD for:  severe uncontrolled pain      Call MD for:  redness, tenderness, or signs of infection (pain, swelling, redness, odor or green/yellow discharge around incision site)      Discharge instructions      Comments:   F/U with Dr. Turner Daniels as scheduled.          SignedHazle Nordmann. 07/19/2011, 7:26 AM

## 2011-10-01 ENCOUNTER — Encounter: Payer: Self-pay | Admitting: *Deleted

## 2011-10-01 ENCOUNTER — Encounter: Payer: Self-pay | Admitting: Family Medicine

## 2011-10-04 ENCOUNTER — Ambulatory Visit (INDEPENDENT_AMBULATORY_CARE_PROVIDER_SITE_OTHER): Payer: Managed Care, Other (non HMO) | Admitting: Family Medicine

## 2011-10-04 ENCOUNTER — Encounter: Payer: Self-pay | Admitting: Family Medicine

## 2011-10-04 VITALS — BP 126/78 | HR 99 | Temp 98.6°F | Resp 18 | Ht 64.0 in | Wt 202.0 lb

## 2011-10-04 DIAGNOSIS — E119 Type 2 diabetes mellitus without complications: Secondary | ICD-10-CM

## 2011-10-04 DIAGNOSIS — E785 Hyperlipidemia, unspecified: Secondary | ICD-10-CM

## 2011-10-04 DIAGNOSIS — I10 Essential (primary) hypertension: Secondary | ICD-10-CM

## 2011-10-04 DIAGNOSIS — M1710 Unilateral primary osteoarthritis, unspecified knee: Secondary | ICD-10-CM

## 2011-10-04 DIAGNOSIS — G4730 Sleep apnea, unspecified: Secondary | ICD-10-CM

## 2011-10-04 DIAGNOSIS — F319 Bipolar disorder, unspecified: Secondary | ICD-10-CM

## 2011-10-04 DIAGNOSIS — M179 Osteoarthritis of knee, unspecified: Secondary | ICD-10-CM

## 2011-10-04 NOTE — Progress Notes (Signed)
Reviewed and agree.
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
## 2011-10-04 NOTE — Patient Instructions (Addendum)
1. Hypertension   2. Osteoarthritis, knee   3. Hyperlipidemia   4. Diabetes mellitus   5. Bipolar disorder   6. Sleep apnea

## 2011-11-03 ENCOUNTER — Ambulatory Visit (INDEPENDENT_AMBULATORY_CARE_PROVIDER_SITE_OTHER): Payer: Managed Care, Other (non HMO) | Admitting: Family Medicine

## 2011-11-03 ENCOUNTER — Ambulatory Visit: Payer: Managed Care, Other (non HMO)

## 2011-11-03 VITALS — BP 120/82 | HR 88 | Temp 98.4°F | Resp 16 | Ht 64.5 in | Wt 200.6 lb

## 2011-11-03 DIAGNOSIS — J209 Acute bronchitis, unspecified: Secondary | ICD-10-CM

## 2011-11-03 DIAGNOSIS — J45901 Unspecified asthma with (acute) exacerbation: Secondary | ICD-10-CM

## 2011-11-03 DIAGNOSIS — E109 Type 1 diabetes mellitus without complications: Secondary | ICD-10-CM

## 2011-11-03 MED ORDER — ALBUTEROL SULFATE (2.5 MG/3ML) 0.083% IN NEBU
2.5000 mg | INHALATION_SOLUTION | Freq: Once | RESPIRATORY_TRACT | Status: AC
  Administered 2011-11-03: 2.5 mg via RESPIRATORY_TRACT

## 2011-11-03 MED ORDER — PREDNISONE 20 MG PO TABS: ORAL_TABLET | ORAL | Status: DC

## 2011-11-03 MED ORDER — AZITHROMYCIN 250 MG PO TABS: ORAL_TABLET | ORAL | Status: DC

## 2011-11-03 MED ORDER — IPRATROPIUM BROMIDE 0.02 % IN SOLN
0.5000 mg | Freq: Once | RESPIRATORY_TRACT | Status: AC
  Administered 2011-11-03: 0.5 mg via RESPIRATORY_TRACT
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
## 2011-11-03 NOTE — Progress Notes (Signed)
Reviewed and agree.

## 2011-11-11 ENCOUNTER — Encounter: Payer: Self-pay | Admitting: Family Medicine

## 2011-11-11 ENCOUNTER — Ambulatory Visit (INDEPENDENT_AMBULATORY_CARE_PROVIDER_SITE_OTHER): Payer: Managed Care, Other (non HMO) | Admitting: Family Medicine

## 2011-11-11 ENCOUNTER — Ambulatory Visit: Payer: Managed Care, Other (non HMO)

## 2011-11-11 VITALS — BP 122/84 | HR 96 | Temp 98.1°F | Resp 22 | Ht 64.0 in | Wt 201.2 lb

## 2011-11-11 DIAGNOSIS — J45901 Unspecified asthma with (acute) exacerbation: Secondary | ICD-10-CM

## 2011-11-11 DIAGNOSIS — J209 Acute bronchitis, unspecified: Secondary | ICD-10-CM

## 2011-11-11 DIAGNOSIS — E1065 Type 1 diabetes mellitus with hyperglycemia: Secondary | ICD-10-CM

## 2011-11-11 LAB — POCT CBC
Granulocyte percent: 85.9 %G — AB (ref 37–80)
Hemoglobin: 15.9 g/dL (ref 12.2–16.2)
MCH, POC: 27.7 pg (ref 27–31.2)
MID (cbc): 0.8 (ref 0–0.9)
MPV: 9.6 fL (ref 0–99.8)
POC MID %: 5.1 %M (ref 0–12)
Platelet Count, POC: 439 10*3/uL — AB (ref 142–424)
RBC: 5.74 M/uL — AB (ref 4.04–5.48)
WBC: 15.8 10*3/uL — AB (ref 4.6–10.2)

## 2011-11-11 MED ORDER — ALBUTEROL SULFATE (2.5 MG/3ML) 0.083% IN NEBU
2.5000 mg | INHALATION_SOLUTION | Freq: Once | RESPIRATORY_TRACT | Status: AC
  Administered 2011-11-11: 2.5 mg via RESPIRATORY_TRACT

## 2011-11-11 MED ORDER — IPRATROPIUM BROMIDE 0.02 % IN SOLN
0.5000 mg | Freq: Once | RESPIRATORY_TRACT | Status: AC
  Administered 2011-11-11: 0.5 mg via RESPIRATORY_TRACT

## 2011-11-11 MED ORDER — PREDNISONE 20 MG PO TABS: ORAL_TABLET | ORAL | Status: DC

## 2011-11-11 MED ORDER — ALBUTEROL SULFATE (2.5 MG/3ML) 0.083% IN NEBU: 2.5000 mg | INHALATION_SOLUTION | Freq: Four times a day (QID) | RESPIRATORY_TRACT | Status: DC | PRN

## 2011-11-11 MED ORDER — CEFDINIR 300 MG PO CAPS: 600.0000 mg | ORAL_CAPSULE | Freq: Every day | ORAL | Status: DC

## 2011-11-11 NOTE — Progress Notes (Signed)
[address]   [place], [place]  [address]   [phone]  Subjective:    Patient ID: Karla Rodriguez, female    DOB: 1965-04-26, 46 y.o.   MRN: [id number]  HPIThis 46 y.o. female presents for one week follow-up of persistent cough, wheezing. Treated one week ago with high dose Zithromax, Prednisone taper. Improved some but has now worsened.   No fever but +clammy, sweating onset two days ago.  +sinus congestion; +nasal congestion.  Taking  Zyrtec, Singulair qhs; taking Albuterol HFA every 6 hours for wheezing.  No ear pain; no sore throat.  Coughing has decreased since last week; but more productive; looser; not sure of color.  Tastes funny.   Wheezing improved after last visit but never resolved; stayed winded since last visit.  Has worsened since last week.  Will walk to boss's office with labored breathing.  Awoke SOB at 4:00am this morning;. scared her last night.  Using Albuterol HFA qid for past week.  Did blow leaves five days ago; known allergy to dust, etc.  No v/d. No rash.  Still on Prednisone taper; down to one tablet daily of Prednisone. Completed high dose Zithromax.  Sugars running 200-300; running basal insulin rate at 130% of baseline. Marland Kitchen   PMH: reviewed and in chart. Psurg: reviewed and in chart.   Prior to Admission medications   Medication Sig Start Date End Date Taking? Authorizing Provider  albuterol (PROVENTIL HFA) 108 (90 BASE) MCG/ACT inhaler Inhale 2 puffs into the lungs every 6 (six) hours as needed. As needed for shortness of breath.   Yes Historical Provider, MD  ALPRAZolam Prudy Feeler) 0.5 MG tablet Take 0.25 mg by mouth at bedtime as needed. As needed for anxiety and to help sleep.   Yes Historical Provider, MD  asenapine (SAPHRIS) 5 MG SUBL Place 5 mg under the tongue at bedtime.   Yes Historical Provider, MD  cetirizine (ZYRTEC) 10 MG tablet Take 10 mg by mouth at bedtime.    Yes Historical Provider, MD  Cholecalciferol (VITAMIN D PO) Take 50,000 Units by mouth once a  week. Sunday   Yes Historical Provider, MD  ciclesonide (OMNARIS) 50 MCG/ACT nasal spray Place 2 sprays into both nostrils as needed.   Yes Historical Provider, MD  escitalopram (LEXAPRO) 20 MG tablet Take 20 mg by mouth every morning.   Yes Historical Provider, MD  esomeprazole (NEXIUM) 40 MG capsule Take 40 mg by mouth at bedtime. Once daily   Yes Historical Provider, MD  glucose blood test strip 1 each by Other route as needed. Use as instructed   Yes Historical Provider, MD  hydrocortisone (ANUSOL-HC) 25 MG suppository Place 25 mg rectally as needed.   Yes Historical Provider, MD  ibuprofen (ADVIL,MOTRIN) 200 MG tablet Take 800 mg by mouth every 6 (six) hours as needed.   Yes Historical Provider, MD  LamoTRIgine (LAMICTAL ODT) 50 MG TBDP Take 50 mg by mouth daily.   Yes Historical Provider, MD  lamoTRIgine (LAMICTAL) 150 MG tablet Take 300 mg by mouth at bedtime.   Yes Historical Provider, MD  lisinopril (PRINIVIL,ZESTRIL) 10 MG tablet Take 10 mg by mouth at bedtime.    Yes Historical Provider, MD  montelukast (SINGULAIR) 10 MG tablet Take 10 mg by mouth at bedtime.    Yes Historical Provider, MD  predniSONE (DELTASONE) 20 MG tablet 2 daily x 5 days then 1 daily x 5 days 11/03/11  Yes Ethelda Chick, MD  ramelteon (ROZEREM) 8 MG tablet Take  8 mg by mouth at bedtime as needed. As needed for sleep.   Yes Historical Provider, MD  rosuvastatin (CRESTOR) 20 MG tablet Take 20 mg by mouth at bedtime.    Yes Historical Provider, MD  UNABLE TO FIND Allergy Injections Pt +dogs,cats,moldspores,dust mites,household dust.   Yes Historical Provider, MD  azithromycin (ZITHROMAX) 250 MG tablet 2 daily x 5 days 11/03/11   Ethelda Chick, MD  fluticasone-salmeterol (ADVAIR HFA) [phone] MCG/ACT inhaler Inhale 2 puffs into the lungs 2 (two) times daily.    Historical Provider, MD  insulin lispro (HUMALOG) 100 UNIT/ML injection Via Insulin pump    Historical Provider, MD    No Known Allergies    Family History    Problem Relation Age of Onset  . Emphysema Paternal Grandfather   . Allergies Father   . Colon cancer Neg Hx   . Anesthesia problems Neg Hx   . Diabetes Mother   . Allergies Brother   . Asthma Brother   . Lung disease Father   . GER disease Father       Review of Systems  Constitutional: Positive for chills, diaphoresis and fatigue. Negative for fever.  HENT: Positive for congestion, rhinorrhea, sneezing, voice change and postnasal drip. Negative for ear pain, sore throat, trouble swallowing, sinus pressure and ear discharge.   Respiratory: Positive for cough, chest tightness, shortness of breath and wheezing.   Cardiovascular: Negative for chest pain, palpitations and leg swelling.  Gastrointestinal: Negative for nausea, vomiting, abdominal pain and diarrhea.       Objective:   Physical Exam  Nursing note and vitals reviewed. Constitutional: She is oriented to person, place, and time. She appears well-developed and well-nourished. No distress.  HENT:  Head: Normocephalic and atraumatic.  Right Ear: External ear normal.  Left Ear: External ear normal.  Nose: Nose normal.  Mouth/Throat: Oropharynx is clear and moist.  Eyes: Conjunctivae normal and EOM are normal. Pupils are equal, round, and reactive to light.  Neck: Normal range of motion. Neck supple.  Cardiovascular: Normal rate, regular rhythm and normal heart sounds.   No murmur heard. Pulmonary/Chest: Not tachypneic. No respiratory distress. She has no decreased breath sounds. She has wheezes in the right upper field, the right middle field, the right lower field, the left upper field, the left middle field and the left lower field. She has rhonchi. She has no rales.  Lymphadenopathy:    She has no cervical adenopathy.  Neurological: She is alert and oriented to person, place, and time.  Skin: Skin is warm and dry. She is not diaphoretic.  Psychiatric: She has a normal mood and affect. Her behavior is normal. Judgment  and thought content normal.     ALBUTEROL/ATROVENT NEBULIZER ADMINISTERED IN OFFICE.  POST NEBULIZER PEAK FLOW 300.  IMPROVED AIR MOVEMENT AFTER NEBULIZER YET PERSISTENT INSPIRATORY AND EXPIRATORY WHEEZING.    [facility] reading (PRIMARY) by  Dr. Katrinka Blazing.  CXR: NAD  Results for orders placed in visit on 11/11/11  POCT CBC      Component Value Range   WBC 15.8 (*) 4.6 - 10.2 K/uL   Lymph, poc 1.4  0.6 - 3.4   POC LYMPH PERCENT 9.0 (*) 10 - 50 %L   MID (cbc) 0.8  0 - 0.9   POC MID % 5.1  0 - 12 %M   POC Granulocyte 13.6 (*) 2 - 6.9   Granulocyte percent 85.9 (*) 37 - 80 %G   RBC 5.74 (*) 4.04 - 5.48 M/uL  Hemoglobin 15.9  12.2 - 16.2 g/dL   HCT, POC 86.5 (*) 78.4 - 47.9 %   MCV 86.2  80 - 97 fL   MCH, POC 27.7  27 - 31.2 pg   MCHC 32.1  31.8 - 35.4 g/dL   RDW, POC 69.6     Platelet Count, POC 439 (*) 142 - 424 K/uL   MPV 9.6  0 - 99.8 fL  GLUCOSE, POCT (MANUAL RESULT ENTRY)      Component Value Range   POC Glucose 231 (*) 70 - 99 mg/dl      Assessment & Plan:   1. Asthma exacerbation  DG Chest 2 View, POCT CBC, POCT glucose (manual entry), albuterol (PROVENTIL) (2.5 MG/3ML) 0.083% nebulizer solution 2.5 mg, ipratropium (ATROVENT) nebulizer solution 0.5 mg  2. DM I (diabetes mellitus, type I), uncontrolled  POCT glucose (manual entry)  3. Acute bronchitis        1. Asthma Exacerbation: persistent and worsening.  Worsened after dust exposure while working in yard.  Rx for Prednisone taper provided; refer for nebulizer machine to be delivered at home.  Continue Albuterol every 4-6 hours.  RTC or ED for acute worsening. Avoid further dust exposure for next 2-3 weeks. 2. Acute bronchitis/sinusitis: s/p Zithromax; rx for Omnicef provided.  Continue allergy medications. 3.  DMI: uncontrolled; continue high basal rate of insulin pump while continuing steroid; contact endocrinology regarding repeat Prednisone taper.   Meds ordered this encounter  Medications  . albuterol (PROVENTIL)  (2.5 MG/3ML) 0.083% nebulizer solution 2.5 mg    Sig:   . ipratropium (ATROVENT) nebulizer solution 0.5 mg    Sig:   . cefdinir (OMNICEF) 300 MG capsule    Sig: Take 2 capsules (600 mg total) by mouth daily.    Dispense:  20 capsule    Refill:  0  . predniSONE (DELTASONE) 20 MG tablet    Sig: 3 tablets daily x 2 days, then 2 tablets daily x 5 days    Dispense:  16 tablet    Refill:  0  . albuterol (PROVENTIL) (2.5 MG/3ML) 0.083% nebulizer solution    Sig: Take 3 mLs (2.5 mg total) by nebulization every 6 (six) hours as needed for wheezing.    Dispense:  75 mL    Refill:  12

## 2011-11-19 NOTE — Progress Notes (Signed)
Reviewed and agree.

## 2012-02-20 ENCOUNTER — Emergency Department (HOSPITAL_COMMUNITY): Payer: Managed Care, Other (non HMO)

## 2012-02-20 ENCOUNTER — Ambulatory Visit (INDEPENDENT_AMBULATORY_CARE_PROVIDER_SITE_OTHER): Payer: Managed Care, Other (non HMO) | Admitting: Family Medicine

## 2012-02-20 ENCOUNTER — Encounter (HOSPITAL_COMMUNITY): Payer: Self-pay | Admitting: Emergency Medicine

## 2012-02-20 ENCOUNTER — Inpatient Hospital Stay (HOSPITAL_COMMUNITY)
Admission: EM | Admit: 2012-02-20 | Discharge: 2012-02-23 | DRG: 193 | Disposition: A | Discharge status: Home / Self Care | Payer: Managed Care, Other (non HMO) | Attending: Internal Medicine | Admitting: Internal Medicine

## 2012-02-20 VITALS — BP 109/76 | HR 98 | Temp 99.6°F

## 2012-02-20 DIAGNOSIS — R739 Hyperglycemia, unspecified: Secondary | ICD-10-CM

## 2012-02-20 DIAGNOSIS — J189 Pneumonia, unspecified organism: Principal | ICD-10-CM | POA: Diagnosis present

## 2012-02-20 DIAGNOSIS — E1010 Type 1 diabetes mellitus with ketoacidosis without coma: Secondary | ICD-10-CM

## 2012-02-20 DIAGNOSIS — J309 Allergic rhinitis, unspecified: Secondary | ICD-10-CM

## 2012-02-20 DIAGNOSIS — R079 Chest pain, unspecified: Secondary | ICD-10-CM

## 2012-02-20 DIAGNOSIS — F429 Obsessive-compulsive disorder, unspecified: Secondary | ICD-10-CM

## 2012-02-20 DIAGNOSIS — Z9641 Presence of insulin pump (external) (internal): Secondary | ICD-10-CM

## 2012-02-20 DIAGNOSIS — F3130 Bipolar disorder, current episode depressed, mild or moderate severity, unspecified: Secondary | ICD-10-CM | POA: Diagnosis present

## 2012-02-20 DIAGNOSIS — M1712 Unilateral primary osteoarthritis, left knee: Secondary | ICD-10-CM

## 2012-02-20 DIAGNOSIS — Z96659 Presence of unspecified artificial knee joint: Secondary | ICD-10-CM

## 2012-02-20 DIAGNOSIS — E109 Type 1 diabetes mellitus without complications: Secondary | ICD-10-CM

## 2012-02-20 DIAGNOSIS — E1110 Type 2 diabetes mellitus with ketoacidosis without coma: Secondary | ICD-10-CM

## 2012-02-20 DIAGNOSIS — R05 Cough: Secondary | ICD-10-CM

## 2012-02-20 DIAGNOSIS — K449 Diaphragmatic hernia without obstruction or gangrene: Secondary | ICD-10-CM | POA: Diagnosis present

## 2012-02-20 DIAGNOSIS — M25519 Pain in unspecified shoulder: Secondary | ICD-10-CM

## 2012-02-20 DIAGNOSIS — M2342 Loose body in knee, left knee: Secondary | ICD-10-CM

## 2012-02-20 DIAGNOSIS — K589 Irritable bowel syndrome without diarrhea: Secondary | ICD-10-CM | POA: Diagnosis present

## 2012-02-20 DIAGNOSIS — I10 Essential (primary) hypertension: Secondary | ICD-10-CM | POA: Diagnosis present

## 2012-02-20 DIAGNOSIS — F411 Generalized anxiety disorder: Secondary | ICD-10-CM

## 2012-02-20 DIAGNOSIS — G4730 Sleep apnea, unspecified: Secondary | ICD-10-CM | POA: Diagnosis present

## 2012-02-20 DIAGNOSIS — E7800 Pure hypercholesterolemia, unspecified: Secondary | ICD-10-CM | POA: Diagnosis present

## 2012-02-20 DIAGNOSIS — F1010 Alcohol abuse, uncomplicated: Secondary | ICD-10-CM

## 2012-02-20 DIAGNOSIS — Z794 Long term (current) use of insulin: Secondary | ICD-10-CM

## 2012-02-20 DIAGNOSIS — J45901 Unspecified asthma with (acute) exacerbation: Secondary | ICD-10-CM

## 2012-02-20 DIAGNOSIS — J45909 Unspecified asthma, uncomplicated: Secondary | ICD-10-CM | POA: Diagnosis present

## 2012-02-20 DIAGNOSIS — K3184 Gastroparesis: Secondary | ICD-10-CM | POA: Diagnosis present

## 2012-02-20 DIAGNOSIS — R61 Generalized hyperhidrosis: Secondary | ICD-10-CM

## 2012-02-20 DIAGNOSIS — D509 Iron deficiency anemia, unspecified: Secondary | ICD-10-CM | POA: Diagnosis present

## 2012-02-20 DIAGNOSIS — R110 Nausea: Secondary | ICD-10-CM

## 2012-02-20 DIAGNOSIS — D259 Leiomyoma of uterus, unspecified: Secondary | ICD-10-CM

## 2012-02-20 DIAGNOSIS — R7309 Other abnormal glucose: Secondary | ICD-10-CM

## 2012-02-20 DIAGNOSIS — K219 Gastro-esophageal reflux disease without esophagitis: Secondary | ICD-10-CM | POA: Diagnosis present

## 2012-02-20 DIAGNOSIS — Z79899 Other long term (current) drug therapy: Secondary | ICD-10-CM

## 2012-02-20 DIAGNOSIS — D235 Other benign neoplasm of skin of trunk: Secondary | ICD-10-CM

## 2012-02-20 DIAGNOSIS — F17200 Nicotine dependence, unspecified, uncomplicated: Secondary | ICD-10-CM | POA: Diagnosis present

## 2012-02-20 DIAGNOSIS — E1159 Type 2 diabetes mellitus with other circulatory complications: Secondary | ICD-10-CM | POA: Diagnosis present

## 2012-02-20 DIAGNOSIS — E871 Hypo-osmolality and hyponatremia: Secondary | ICD-10-CM | POA: Diagnosis present

## 2012-02-20 LAB — CBC WITH DIFFERENTIAL/PLATELET
Basophils Absolute: 0 10*3/uL (ref 0.0–0.1)
HCT: 39 % (ref 36.0–46.0)
Lymphocytes Relative: 4 % — ABNORMAL LOW (ref 12–46)
Monocytes Relative: 11 % (ref 3–12)
Neutro Abs: 18.1 10*3/uL — ABNORMAL HIGH (ref 1.7–7.7)
Platelets: 180 10*3/uL (ref 150–400)
RDW: 13.9 % (ref 11.5–15.5)
WBC: 21.2 10*3/uL — ABNORMAL HIGH (ref 4.0–10.5)

## 2012-02-20 LAB — COMPREHENSIVE METABOLIC PANEL
ALT: 8 U/L (ref 0–35)
AST: 17 U/L (ref 0–37)
Albumin: 3.3 g/dL — ABNORMAL LOW (ref 3.5–5.2)
Alkaline Phosphatase: 72 U/L (ref 39–117)
Chloride: 93 mEq/L — ABNORMAL LOW (ref 96–112)
Potassium: 4 mEq/L (ref 3.5–5.1)
Total Bilirubin: 0.6 mg/dL (ref 0.3–1.2)

## 2012-02-20 LAB — URINE MICROSCOPIC-ADD ON

## 2012-02-20 LAB — URINALYSIS, ROUTINE W REFLEX MICROSCOPIC
Ketones, ur: 40 mg/dL — AB
Leukocytes, UA: NEGATIVE
Nitrite: NEGATIVE
Specific Gravity, Urine: 1.014 (ref 1.005–1.030)
pH: 6 (ref 5.0–8.0)

## 2012-02-20 LAB — GLUCOSE, CAPILLARY: Glucose-Capillary: 290 mg/dL — ABNORMAL HIGH (ref 70–99)

## 2012-02-20 MED ORDER — DEXTROSE 5 % IV SOLN: 500.0000 mg | Freq: Once | INTRAVENOUS | Status: DC

## 2012-02-20 MED ORDER — AZITHROMYCIN 500 MG IV SOLR
500.0000 mg | INTRAVENOUS | Status: DC
  Administered 2012-02-20 – 2012-02-22 (×3): 500 mg via INTRAVENOUS
  Filled 2012-02-20 (×4): qty 500

## 2012-02-20 MED ORDER — AZELASTINE HCL 0.1 % NA SOLN
2.0000 | Freq: Every day | NASAL | Status: DC
  Filled 2012-02-20: qty 30

## 2012-02-20 MED ORDER — ENOXAPARIN SODIUM 40 MG/0.4ML ~~LOC~~ SOLN
40.0000 mg | SUBCUTANEOUS | Status: DC
  Administered 2012-02-20 – 2012-02-22 (×3): 40 mg via SUBCUTANEOUS
  Filled 2012-02-20 (×4): qty 0.4

## 2012-02-20 MED ORDER — IOHEXOL 350 MG/ML SOLN
100.0000 mL | Freq: Once | INTRAVENOUS | Status: AC | PRN
  Administered 2012-02-20: 100 mL via INTRAVENOUS

## 2012-02-20 MED ORDER — ACETAMINOPHEN 325 MG PO TABS
650.0000 mg | ORAL_TABLET | Freq: Once | ORAL | Status: AC
  Administered 2012-02-20: 650 mg via ORAL
  Filled 2012-02-20: qty 2

## 2012-02-20 MED ORDER — NITROGLYCERIN 0.4 MG SL SUBL: 0.4000 mg | SUBLINGUAL_TABLET | SUBLINGUAL | Status: DC | PRN

## 2012-02-20 MED ORDER — SODIUM CHLORIDE 0.9 % IV SOLN
INTRAVENOUS | Status: DC
  Administered 2012-02-20 – 2012-02-21 (×2): via INTRAVENOUS

## 2012-02-20 MED ORDER — MORPHINE SULFATE 4 MG/ML IJ SOLN
4.0000 mg | Freq: Once | INTRAMUSCULAR | Status: AC
  Administered 2012-02-20: 4 mg via INTRAVENOUS
  Filled 2012-02-20: qty 1

## 2012-02-20 MED ORDER — SODIUM CHLORIDE 0.9 % IV SOLN
INTRAVENOUS | Status: DC
  Administered 2012-02-21: 06:00:00 via INTRAVENOUS
  Administered 2012-02-21: 4 [IU]/h via INTRAVENOUS
  Administered 2012-02-21: 2 [IU]/h via INTRAVENOUS
  Filled 2012-02-20 (×2): qty 1

## 2012-02-20 MED ORDER — ONDANSETRON HCL 4 MG PO TABS: 4.0000 mg | ORAL_TABLET | Freq: Four times a day (QID) | ORAL | Status: DC | PRN

## 2012-02-20 MED ORDER — LORATADINE 10 MG PO TABS
10.0000 mg | ORAL_TABLET | Freq: Every day | ORAL | Status: DC
  Administered 2012-02-21 – 2012-02-23 (×3): 10 mg via ORAL
  Filled 2012-02-20 (×3): qty 1

## 2012-02-20 MED ORDER — LAMOTRIGINE 50 MG PO TBDP: 50.0000 mg | ORAL_TABLET | Freq: Every day | ORAL | Status: DC

## 2012-02-20 MED ORDER — DEXTROSE-NACL 5-0.45 % IV SOLN: INTRAVENOUS | Status: DC

## 2012-02-20 MED ORDER — HYDROCODONE-ACETAMINOPHEN 5-325 MG PO TABS
1.0000 | ORAL_TABLET | ORAL | Status: DC | PRN
  Administered 2012-02-20 – 2012-02-23 (×12): 2 via ORAL
  Filled 2012-02-20 (×12): qty 2

## 2012-02-20 MED ORDER — ATORVASTATIN CALCIUM 10 MG PO TABS
10.0000 mg | ORAL_TABLET | Freq: Every day | ORAL | Status: DC
  Administered 2012-02-21 – 2012-02-23 (×3): 10 mg via ORAL
  Filled 2012-02-20 (×3): qty 1

## 2012-02-20 MED ORDER — PANTOPRAZOLE SODIUM 40 MG PO TBEC
40.0000 mg | DELAYED_RELEASE_TABLET | Freq: Every day | ORAL | Status: DC
  Administered 2012-02-21 – 2012-02-23 (×3): 40 mg via ORAL
  Filled 2012-02-20 (×2): qty 1

## 2012-02-20 MED ORDER — ASENAPINE MALEATE 5 MG SL SUBL
5.0000 mg | SUBLINGUAL_TABLET | Freq: Every day | SUBLINGUAL | Status: DC
  Administered 2012-02-20 – 2012-02-22 (×3): 5 mg via SUBLINGUAL
  Filled 2012-02-20 (×4): qty 1

## 2012-02-20 MED ORDER — DEXTROSE 50 % IV SOLN: 25.0000 mL | INTRAVENOUS | Status: DC | PRN

## 2012-02-20 MED ORDER — DEXTROSE 5 % IV SOLN
1.0000 g | Freq: Once | INTRAVENOUS | Status: DC
  Administered 2012-02-20: 1 g via INTRAVENOUS
  Filled 2012-02-20: qty 10

## 2012-02-20 MED ORDER — ESCITALOPRAM OXALATE 20 MG PO TABS
20.0000 mg | ORAL_TABLET | Freq: Every morning | ORAL | Status: DC
  Administered 2012-02-21 – 2012-02-23 (×3): 20 mg via ORAL
  Filled 2012-02-20 (×3): qty 1

## 2012-02-20 MED ORDER — ALBUTEROL SULFATE (5 MG/ML) 0.5% IN NEBU
2.5000 mg | INHALATION_SOLUTION | RESPIRATORY_TRACT | Status: DC | PRN
  Filled 2012-02-20 (×2): qty 0.5

## 2012-02-20 MED ORDER — NITROGLYCERIN 0.4 MG SL SUBL
0.4000 mg | SUBLINGUAL_TABLET | SUBLINGUAL | Status: DC | PRN
  Administered 2012-02-20: 0.4 mg via SUBLINGUAL

## 2012-02-20 MED ORDER — LAMOTRIGINE 25 MG PO TABS
50.0000 mg | ORAL_TABLET | Freq: Every day | ORAL | Status: DC
  Administered 2012-02-21 – 2012-02-23 (×3): 50 mg via ORAL
  Filled 2012-02-20 (×3): qty 2

## 2012-02-20 MED ORDER — MOMETASONE FURO-FORMOTEROL FUM 100-5 MCG/ACT IN AERO
2.0000 | INHALATION_SPRAY | Freq: Two times a day (BID) | RESPIRATORY_TRACT | Status: DC
  Administered 2012-02-20 – 2012-02-23 (×4): 2 via RESPIRATORY_TRACT
  Filled 2012-02-20: qty 8.8

## 2012-02-20 MED ORDER — INSULIN ASPART 100 UNIT/ML ~~LOC~~ SOLN
10.0000 [IU] | Freq: Once | SUBCUTANEOUS | Status: DC
  Filled 2012-02-20: qty 1

## 2012-02-20 MED ORDER — INSULIN REGULAR HUMAN 100 UNIT/ML IJ SOLN: 10.0000 [IU] | Freq: Once | INTRAMUSCULAR | Status: DC

## 2012-02-20 MED ORDER — RAMELTEON 8 MG PO TABS
8.0000 mg | ORAL_TABLET | Freq: Every day | ORAL | Status: DC
  Administered 2012-02-21 – 2012-02-22 (×2): 8 mg via ORAL
  Filled 2012-02-20 (×4): qty 1

## 2012-02-20 MED ORDER — GUAIFENESIN-DM 100-10 MG/5ML PO SYRP
5.0000 mL | ORAL_SOLUTION | ORAL | Status: DC | PRN
  Administered 2012-02-20: 5 mL via ORAL
  Filled 2012-02-20: qty 5

## 2012-02-20 MED ORDER — ALBUTEROL SULFATE (5 MG/ML) 0.5% IN NEBU
2.5000 mg | INHALATION_SOLUTION | Freq: Four times a day (QID) | RESPIRATORY_TRACT | Status: DC
Start: 2012-02-20 — End: 2012-02-22
  Administered 2012-02-20 – 2012-02-21 (×5): 2.5 mg via RESPIRATORY_TRACT
  Filled 2012-02-20 (×5): qty 0.5

## 2012-02-20 MED ORDER — ONDANSETRON HCL 4 MG/2ML IJ SOLN: 4.0000 mg | Freq: Four times a day (QID) | INTRAMUSCULAR | Status: DC | PRN

## 2012-02-20 MED ORDER — MONTELUKAST SODIUM 10 MG PO TABS
10.0000 mg | ORAL_TABLET | Freq: Every day | ORAL | Status: DC
  Administered 2012-02-20 – 2012-02-22 (×3): 10 mg via ORAL
  Filled 2012-02-20 (×4): qty 1

## 2012-02-20 MED ORDER — INSULIN REGULAR BOLUS VIA INFUSION
0.0000 [IU] | Freq: Three times a day (TID) | INTRAVENOUS | Status: DC
  Filled 2012-02-20: qty 10

## 2012-02-20 MED ORDER — CICLESONIDE 50 MCG/ACT NA SUSP: 2.0000 | Freq: Every day | NASAL | Status: DC

## 2012-02-20 MED ORDER — DEXTROSE 5 % IV SOLN
1.0000 g | INTRAVENOUS | Status: DC
  Administered 2012-02-21 – 2012-02-22 (×2): 1 g via INTRAVENOUS
  Filled 2012-02-20 (×3): qty 10

## 2012-02-20 MED ORDER — DEXTROSE 5 % IV SOLN: 1.0000 g | INTRAVENOUS | Status: DC

## 2012-02-20 MED ORDER — MORPHINE SULFATE 4 MG/ML IJ SOLN
2.0000 mg | INTRAMUSCULAR | Status: DC | PRN
  Administered 2012-02-20 – 2012-02-22 (×2): 2 mg via INTRAVENOUS
  Filled 2012-02-20 (×2): qty 1

## 2012-02-20 MED ORDER — LISINOPRIL 10 MG PO TABS
10.0000 mg | ORAL_TABLET | Freq: Every day | ORAL | Status: DC
  Administered 2012-02-20 – 2012-02-22 (×3): 10 mg via ORAL
  Filled 2012-02-20 (×4): qty 1

## 2012-02-20 MED ORDER — ALPRAZOLAM 0.25 MG PO TABS
0.2500 mg | ORAL_TABLET | Freq: Two times a day (BID) | ORAL | Status: DC
  Administered 2012-02-20 – 2012-02-22 (×3): 0.25 mg via ORAL
  Filled 2012-02-20 (×4): qty 1

## 2012-02-20 MED ORDER — SODIUM CHLORIDE 0.9 % IV BOLUS (SEPSIS)
500.0000 mL | Freq: Once | INTRAVENOUS | Status: AC
  Administered 2012-02-20: 500 mL via INTRAVENOUS

## 2012-02-20 NOTE — ED Notes (Addendum)
Pt here with c/o right shoulder pain, headache, SOB, hot and clamy, and nausea x1. EMS given nitro x1 and chewable ASA 324mg . Pt was seen at PCP office and sent here for reevaluation for cardiac event. Vitals per EMS BP 133/60,  O2 98-99%, CBG 270.
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
## 2012-02-20 NOTE — ED Notes (Signed)
Spoke with Dr. Bess Harvest that patient pt want use her own insulin pump, Dr. Bess Harvest states that pt insulin pump need to be discontinued and use hospital insulin.

## 2012-02-20 NOTE — Progress Notes (Signed)
  Subjective:    Patient ID: Graciella Freer, female    DOB: 12/26/1965, 47 y.o.   MRN: [id number]  HPI SHAQUASHA GERSTEL is a 47 y.o. female Brought back emergently due to dyspnea/diaphoresis and chest/shoulder pain.  Started yesterday morning with L shoulder pain radiating down arm, numb fingers. Pain with breathing in upper front chest to shoulder. Nausea, and sweaty today.  Slight cough, no known fever, no focal weakness. Tried one leftover oxycodone x 1 this am with no relief. No hx of narcotic withdrawal or frequent/chronic use.  No recent prolonged car travel or air travel, no recent calf pain or swelling.  Review of Systems  Constitutional: Negative for fever and chills.  Respiratory: Positive for cough and chest tightness. Negative for shortness of breath.   Cardiovascular: Positive for chest pain.  Musculoskeletal: Positive for arthralgias.       L shoulder       Objective:   Physical Exam  Constitutional: She is oriented to person, place, and time. She appears well-developed and well-nourished.       Appears diaphoretic, uncomfortable.   HENT:  Head: Normocephalic and atraumatic.  Eyes: Conjunctivae normal and EOM are normal. Pupils are equal, round, and reactive to light.  Neck: Carotid bruit is not present.  Cardiovascular: Normal rate, regular rhythm, normal heart sounds and intact distal pulses.   Pulmonary/Chest: Effort normal and breath sounds normal.  Abdominal: Soft. She exhibits no pulsatile midline mass. There is no tenderness.  Musculoskeletal:       Locates pain to anterior L shoulder.   Neurological: She is alert and oriented to person, place, and time.       No focal weakness.   Skin: Skin is warm. She is diaphoretic.  Psychiatric: She has a normal mood and affect. Her behavior is normal.   Results for orders placed in visit on 02/20/12  GLUCOSE, POCT (MANUAL RESULT ENTRY)      Component Value Range   POC Glucose 349 (*) 70 - 99 mg/dl  drawn at 4098.    EKG: Sr, no acute findings.     Assessment & Plan:  ZAKIRAH WEINGART is a 47 y.o. female 1. Pain in shoulder  POCT glucose (manual entry)  2. Chest pain  nitroGLYCERIN (NITROSTAT) SL tablet 0.4 mg, POCT glucose (manual entry), DISCONTINUED: nitroGLYCERIN (NITROSTAT) 0.4 MG SL tablet  3. DM type 1 (diabetes mellitus, type 1)    4. Diaphoresis    5. Nausea    6. Hyperglycemia     Acute onset of L shoulder and pleuritic pain, diaphoresis and nausea, hyperglycemic in office. DDx includes ACS/referred pain with D<1, vs neuronal impingement.  02nc at 2L, EMS contacted at 1240.  ASA 324mg  chewable at 1240. IV placed - LAC with NS at [facility] at 1243, repeat BP 109/71, temp 99.6 at 1247. NTG 0.4mg  Sl at 1251 - initial pain 6/10 - imporved to 4/10 in few minutes. EMS an scene and transfre of care at 1255.  tx to [facility] by EMS.
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
## 2012-02-21 DIAGNOSIS — F17200 Nicotine dependence, unspecified, uncomplicated: Secondary | ICD-10-CM

## 2012-02-21 DIAGNOSIS — E109 Type 1 diabetes mellitus without complications: Secondary | ICD-10-CM

## 2012-02-21 LAB — BASIC METABOLIC PANEL
BUN: 14 mg/dL (ref 6–23)
CO2: 13 mEq/L — ABNORMAL LOW (ref 19–32)
Calcium: 8.1 mg/dL — ABNORMAL LOW (ref 8.4–10.5)
Chloride: 97 mEq/L (ref 96–112)
Chloride: 99 mEq/L (ref 96–112)
Creatinine, Ser: 0.67 mg/dL (ref 0.50–1.10)
GFR calc Af Amer: >90 mL/min (ref >90)
Glucose, Bld: 372 mg/dL — ABNORMAL HIGH (ref 70–99)
Potassium: 3.6 mEq/L (ref 3.5–5.1)
Sodium: 129 mEq/L — ABNORMAL LOW (ref 135–145)

## 2012-02-21 LAB — CBC
HCT: 35.5 % — ABNORMAL LOW (ref 36.0–46.0)
MCH: 28.9 pg (ref 26.0–34.0)
MCHC: 34.4 g/dL (ref 30.0–36.0)
MCHC: 35.2 g/dL (ref 30.0–36.0)
MCV: 83.7 fL (ref 78.0–100.0)
Platelets: 177 10*3/uL (ref 150–400)
RBC: 4.08 MIL/uL (ref 3.87–5.11)
RDW: 14.2 % (ref 11.5–15.5)

## 2012-02-21 LAB — EXPECTORATED SPUTUM ASSESSMENT W GRAM STAIN, RFLX TO RESP C

## 2012-02-21 LAB — OSMOLALITY, URINE: Osmolality, Ur: 232 mOsm/kg — ABNORMAL LOW (ref 390–1090)

## 2012-02-21 LAB — GLUCOSE, CAPILLARY
Glucose-Capillary: 363 mg/dL — ABNORMAL HIGH (ref 70–99)
Glucose-Capillary: 281 mg/dL — ABNORMAL HIGH (ref 70–99)
Glucose-Capillary: 253 mg/dL — ABNORMAL HIGH (ref 70–99)
Glucose-Capillary: 206 mg/dL — ABNORMAL HIGH (ref 70–99)
Glucose-Capillary: 155 mg/dL — ABNORMAL HIGH (ref 70–99)
Glucose-Capillary: 137 mg/dL — ABNORMAL HIGH (ref 70–99)
Glucose-Capillary: 150 mg/dL — ABNORMAL HIGH (ref 70–99)
Glucose-Capillary: 225 mg/dL — ABNORMAL HIGH (ref 70–99)
Glucose-Capillary: 197 mg/dL — ABNORMAL HIGH (ref 70–99)

## 2012-02-21 LAB — INFLUENZA PANEL BY PCR (TYPE A & B)
H1N1 flu by pcr: NOT DETECTED
Influenza B By PCR: NEGATIVE

## 2012-02-21 LAB — MAGNESIUM
Magnesium: 1.9 mg/dL (ref 1.5–2.5)
Magnesium: 2.3 mg/dL (ref 1.5–2.5)

## 2012-02-21 LAB — HIV ANTIBODY (ROUTINE TESTING W REFLEX): HIV: NONREACTIVE

## 2012-02-21 LAB — HEMOGLOBIN A1C: Mean Plasma Glucose: 200 mg/dL — ABNORMAL HIGH (ref <117)

## 2012-02-21 MED ORDER — INSULIN ASPART 100 UNIT/ML ~~LOC~~ SOLN
5.0000 [IU] | Freq: Once | SUBCUTANEOUS | Status: AC
  Administered 2012-02-21: 5 [IU] via SUBCUTANEOUS

## 2012-02-21 MED ORDER — INSULIN GLARGINE 100 UNIT/ML ~~LOC~~ SOLN
10.0000 [IU] | Freq: Every day | SUBCUTANEOUS | Status: DC
  Administered 2012-02-21: 10 [IU] via SUBCUTANEOUS

## 2012-02-21 MED ORDER — INSULIN ASPART 100 UNIT/ML ~~LOC~~ SOLN: 0.0000 [IU] | Freq: Three times a day (TID) | SUBCUTANEOUS | Status: DC

## 2012-02-21 MED ORDER — INSULIN PUMP
SUBCUTANEOUS | Status: DC
  Administered 2012-02-21: 2.3 via SUBCUTANEOUS
  Filled 2012-02-21: qty 1

## 2012-02-21 MED ORDER — INSULIN GLARGINE 100 UNIT/ML ~~LOC~~ SOLN: 35.0000 [IU] | Freq: Every day | SUBCUTANEOUS | Status: DC

## 2012-02-21 MED ORDER — INSULIN PUMP
SUBCUTANEOUS | Status: DC
  Administered 2012-02-22: 12:00:00 via SUBCUTANEOUS
  Administered 2012-02-22: 1 via SUBCUTANEOUS
  Administered 2012-02-22 (×2): via SUBCUTANEOUS
  Administered 2012-02-23: 3 via SUBCUTANEOUS
  Administered 2012-02-23: 08:00:00 via SUBCUTANEOUS
  Administered 2012-02-23: 1 via SUBCUTANEOUS
  Administered 2012-02-23: 12:00:00 via SUBCUTANEOUS
  Filled 2012-02-21: qty 1

## 2012-02-21 MED ORDER — IBUPROFEN 400 MG PO TABS
400.0000 mg | ORAL_TABLET | ORAL | Status: DC | PRN
  Administered 2012-02-21 – 2012-02-22 (×3): 400 mg via ORAL
  Filled 2012-02-21 (×4): qty 1

## 2012-02-21 MED ORDER — INSULIN ASPART 100 UNIT/ML ~~LOC~~ SOLN: 0.0000 [IU] | Freq: Every day | SUBCUTANEOUS | Status: DC

## 2012-02-21 NOTE — Progress Notes (Signed)
[facility] PROGRESS NOTE  Karla Rodriguez ZOX:[id number] DOB: 1965/11/11 DOA: 02/20/2012 PCP: Nilda Simmer, MD  Assessment/Plan: 1.CAP causing productive cough, shortness of breath and left-sided pleuritic chest pain along with shoulder pain - she was admitted and started on IV antibiotics, her chest pain and should der pain improved with pain medications. We will repeat   chest x-ray tomorrow to look for improvement in the left-sided infiltrate, patient must follow with pulmonary post discharge her primary pulmonologist is Dr. Marcelyn Bruins to rule out a mass. She has been counseled to quit smoking .Marland Kitchen   2.DM I.currently in mild DKA with metabolic acidosis and wide anion gap. For now we'll hold her insulin pump,give her 1 time 10 units of NovoLog subcutaneous now in the ER, place her on glucose stabilizer , her cbg's have imprved. When her cbg's show an improvement to less than 200, we will given her a dose of lantus with SSI.  CBG (last 3)   Basename 02/21/12 1607 02/21/12 1446 02/21/12 1345  GLUCAP 137* 155* 206*       3. History of HTN, dyslipidemia and GERD. No acute issues continue home medications and monitor clinically.  4.Remote history of alcohol abuse. Patient has quit 3 years ago, reinforced to continue abstaining, she also is currently pack-a-day smoker have counseled her to quit smoking.  5.Hyponatremia. Likely due to #1 and #2 above, will check UA along with urine sodium and osmolality, serum osmolality, gentle IV fluids repeat BMP in the morning. Will also check Legionella urinary antigen.  DVT Prophylaxis Lovenox   Code Status: full code Family Communication: daughter at bedside Disposition Plan: possibly tmorrow.     Antibiotics:  Rocephin and zithromax from 1/ 12  HPI/Subjective: Pain int he left shoulder when takes a deep  Breath.  Objective: Filed Vitals:   02/21/12 0239 02/21/12 0629 02/21/12 0746 02/21/12 1430  BP:  93/56  102/66  Pulse:  73  83    Temp: 98.2 F (36.8 C) 98.1 F (36.7 C)  97.7 F (36.5 C)  TempSrc: Oral Oral    Resp:  18  17  Height:      Weight:      SpO2:  98% 97% 99%    Intake/Output Summary (Last 24 hours) at 02/21/12 1637 Last data filed at 02/21/12 1100  Gross per 24 hour  Intake    240 ml  Output   1200 ml  Net   -960 ml   Filed Weights   02/20/12 2235  Weight: 95.255 kg (210 lb)    Exam:   General:  Alert afebrile comfortable  Cardiovascular: s1s2  Respiratory: CTAB  Abdomen: soft NT NDBS+  Data Reviewed: Basic Metabolic Panel:  Lab 02/21/12 8119 02/21/12 0141 02/20/12 1541  NA 131* 129* 126*  K 3.8 3.6 4.0  CL 99 97 93*  CO2 17* 13* 16*  GLUCOSE 332* 372* 348*  BUN 14 11 8   CREATININE 0.67 0.67 0.62  CALCIUM 8.1* 8.1* 8.8  MG 2.3 1.9 --  PHOS -- -- --   Liver Function Tests:  Lab 02/20/12 1541  AST 17  ALT 8  ALKPHOS 72  BILITOT 0.6  PROT 6.6  ALBUMIN 3.3*   No results found for this basename: LIPASE:5,AMYLASE:5 in the last 168 hours No results found for this basename: AMMONIA:5 in the last 168 hours CBC:  Lab 02/21/12 0659 02/21/12 0141 02/20/12 1541  WBC 16.6* 18.7* 21.2*  NEUTROABS -- -- 18.1*  HGB 12.5 11.8* 13.7  HCT 35.5*  34.3* 39.0  MCV 83.7 84.1 83.9  PLT 188 177 180   Cardiac Enzymes:  Lab 02/20/12 1541  CKTOTAL --  CKMB --  CKMBINDEX --  TROPONINI <0.30   BNP (last 3 results) No results found for this basename: PROBNP:3 in the last 8760 hours CBG:  Lab 02/21/12 1446 02/21/12 1345 02/21/12 1251 02/21/12 1155 02/21/12 1055  GLUCAP 155* 206* 238* 232* 281*    Recent Results (from the past 240 hour(s))  CULTURE, EXPECTORATED SPUTUM-ASSESSMENT     Status: Normal   Collection Time   02/20/12  8:00 PM      Component Value Range Status Comment   Specimen Description SPUTUM   Final    Special Requests NONE   Final    Sputum evaluation     Final    Value: THIS SPECIMEN IS ACCEPTABLE. RESPIRATORY CULTURE REPORT TO FOLLOW.   Report Status  02/21/2012 FINAL   Final   CULTURE, RESPIRATORY     Status: Normal (Preliminary result)   Collection Time   02/20/12  8:00 PM      Component Value Range Status Comment   Specimen Description SPUTUM   Final    Special Requests NONE   Final    Gram Stain PENDING   Incomplete    Culture Culture reincubated for better growth   Final    Report Status PENDING   Incomplete      Studies: Dg Chest 2 View  02/20/2012  *RADIOLOGY REPORT*  Clinical Data: Chest pain and shortness of breath.  Left shoulder pain.  Nausea.  Weakness.  Dizziness.  CHEST - 2 VIEW  Comparison: 07/12/2011  Findings: There is a focal area of abnormal density at the left lung base adjacent to the left hemidiaphragm.  This could represent a patchy area of pneumonia.  This appearance is also associated with pulmonary emboli and pulmonary infarction.The heart size and pulmonary vascularity are normal.  There is peribronchial thickening consistent with bronchitis.  No effusions.  No osseous abnormality.  IMPRESSION: Focal area of abnormal density at the left lung base on the left hemidiaphragm which could be a focal pneumonia or possibly a pulmonary infarct.  It is less likely to represent a mass. Bronchitic changes.   Original Report Authenticated By: Francene Boyers, M.D.    Ct Angio Chest Pe W/cm &/or Wo Cm  02/20/2012  *RADIOLOGY REPORT*  Clinical Data: Chest pain and shortness of breath.  Abnormal radiograph.  CT ANGIOGRAPHY CHEST  Technique:  Multidetector CT imaging of the chest using the standard protocol during bolus administration of intravenous contrast. Multiplanar reconstructed images including MIPs were obtained and reviewed to evaluate the vascular anatomy.  Contrast: OMNIPAQUE IOHEXOL 350 MG/ML SOLN  Comparison: Chest x-ray dated 02/20/2012 and chest CT scan dated 06/26/2009  Findings: There are no pulmonary emboli.   There is an area of consolidation of the left lower lobe measuring 10 x 5 x 5 cm.  Immediately posterior  to the heart.  This could represent pneumonia but the possibility of infiltrating tumor must be considered particularly in view of the patient's lack of fever. However, the patient does have an elevated white blood count with left shift.  No significant hilar or mediastinal adenopathy.  Heart size is normal.  No effusions.  There are two tiny patchy areas of parenchymal density in the right lung apex which may represent tiny foci of infiltrate.  The visualized portion of the upper abdomen is normal.  No osseous abnormality.  IMPRESSION: Extensive  area of consolidation in the left lower lobe which probably represents pneumonia.  I feel this is much less likely represent tumor. No pulmonary emboli.   Original Report Authenticated By: Francene Boyers, M.D.     Scheduled Meds:   . albuterol  2.5 mg Nebulization Q6H  . ALPRAZolam  0.25 mg Oral BID  . asenapine  5 mg Sublingual QHS  . atorvastatin  10 mg Oral q1800  . azelastine  2 spray Each Nare Daily  . azithromycin  500 mg Intravenous Q24H  . cefTRIAXone (ROCEPHIN)  IV  1 g Intravenous Q24H  . enoxaparin (LOVENOX) injection  40 mg Subcutaneous Q24H  . escitalopram  20 mg Oral q morning - 10a  . insulin aspart  0-15 Units Subcutaneous TID WC  . insulin aspart  0-5 Units Subcutaneous QHS  . insulin aspart  10 Units Subcutaneous Once  . insulin glargine  35 Units Subcutaneous QHS  . insulin pump   Subcutaneous Q2H  . insulin regular  0-10 Units Intravenous TID WC  . lamoTRIgine  50 mg Oral Daily  . lisinopril  10 mg Oral QHS  . loratadine  10 mg Oral Daily  . mometasone-formoterol  2 puff Inhalation BID  . montelukast  10 mg Oral QHS  . pantoprazole  40 mg Oral Daily  . ramelteon  8 mg Oral QHS   Continuous Infusions:   . sodium chloride 125 mL/hr at 02/21/12 0702  . dextrose 5 % and 0.45% NaCl    . insulin (NOVOLIN-R) infusion 6.2 Units/hr (02/21/12 1609)    Principal Problem:  *CAP (community acquired pneumonia) Active Problems:   DIABETES MELLITUS, TYPE I  HYPERCHOLESTEROLEMIA  ESSENTIAL HYPERTENSION  GERD  Gastroparesis  ANEMIA, IRON DEFICIENCY    Time spent: 28 min    Tristy Udovich  [facility] Pager [phone] If 8PM-8AM, please contact night-coverage at [email or web address], password [email or web address] 02/21/2012, 4:37 PM  LOS: 1 day

## 2012-02-21 NOTE — Progress Notes (Signed)
Patient refuses to have an insulin drip started and wants to keep her insulin pump in. Her CBG was 343 and refused the ordered 10 units of regular insulin. Instead she gave herself 4.2 units with her insulin pump. Upon arrival to our floor her CBG was 290, which is stated was around her normal CBG. Notified Tama Gander, NP on call. Patient was given the contract for insulin pumps. Will continue to monitor CBG's. Earnest Conroy RN

## 2012-02-21 NOTE — Progress Notes (Signed)
Inpatient Diabetes Program Recommendations  AACE/ADA: New Consensus Statement on Inpatient Glycemic Control (2013)  Target Ranges:  Prepandial:   less than 140 mg/dL      Peak postprandial:   less than 180 mg/dL (1-2 hours)      Critically ill patients:  140 - 180 mg/dL   Reason for Visit: Patient uses an insulin pump as an outpatient  Inpatient Diabetes Program Recommendations Insulin - Basal: Patient's insulin pump basal rate is a total of 38.15 units per 24 hours. Correction (SSI): Insulin pump correction factor is 40.  (one unit of rapid-acting insulin will lower CBG an estimated 40 mg/dl) Insulin - Meal Coverage: Insulin pump bolus wizard is set with a 1 to 3.5 CHO ratio HgbA1C: Pednding Diet: CHO modified medium  Note: Patient is currently on GlucoStabilizer with control gradually improving.  Patient's partner, who is a Engineer, civil (consulting), states that patient is normally well-controlled on the insulin pump unless she gets sick-- then, glycemic control is difficult to manage with multiple extra injections to get glucoses down during the acute phase.   Checked insulin pump settings.  Basal rates are as follows: 12 mn = 2.15 units/hr 0300 = 2.45 units/hr 0630 = 2.15 units/hr 1130 and remainder of day = 1 unit/hr Total basal insulin = 38.15 units/24 hrs  Bolus Wizard set as follows: Insulin to CHO ratio is 1 to 3.5 Gm CHO Sensitivity Factor is 40 Target Range = 90 to 140 mg/dl  Dr. Leslie Dales manages insulin pump.  May want to consider asking Dr. Leslie Dales for suggested subcutaneous insulin dosages for when patient transitions off GlucoStabilizer,  If unable to get Dr. Leslie Dales, could consider using Lantus 38 units daily, sensitive correction scale tid with meals, and Novolog 10 units tid as meal coverage-- with titration of all dosages based on CBG's.  Thank you.  Karla Schrieber S. Elsie Lincoln, RN, CNS, CDE Inpatient Diabetes Program, team pager [phone]

## 2012-02-22 ENCOUNTER — Inpatient Hospital Stay (HOSPITAL_COMMUNITY): Payer: Managed Care, Other (non HMO)

## 2012-02-22 DIAGNOSIS — F1010 Alcohol abuse, uncomplicated: Secondary | ICD-10-CM

## 2012-02-22 LAB — BASIC METABOLIC PANEL
Chloride: 105 mEq/L (ref 96–112)
GFR calc Af Amer: >90 mL/min (ref >90)
GFR calc Af Amer: >90 mL/min (ref >90)
GFR calc non Af Amer: >90 mL/min (ref >90)
GFR calc non Af Amer: >90 mL/min (ref >90)
Glucose, Bld: 217 mg/dL — ABNORMAL HIGH (ref 70–99)
Potassium: 3.8 mEq/L (ref 3.5–5.1)
Potassium: 3.8 mEq/L (ref 3.5–5.1)
Sodium: 138 mEq/L (ref 135–145)
Sodium: 137 mEq/L (ref 135–145)

## 2012-02-22 LAB — URINE CULTURE

## 2012-02-22 LAB — CBC
MCHC: 36.2 g/dL — ABNORMAL HIGH (ref 30.0–36.0)
Platelets: 229 10*3/uL (ref 150–400)
RDW: 14.3 % (ref 11.5–15.5)
WBC: 10.1 10*3/uL (ref 4.0–10.5)

## 2012-02-22 LAB — GLUCOSE, CAPILLARY
Glucose-Capillary: 192 mg/dL — ABNORMAL HIGH (ref 70–99)
Glucose-Capillary: 196 mg/dL — ABNORMAL HIGH (ref 70–99)
Glucose-Capillary: 201 mg/dL — ABNORMAL HIGH (ref 70–99)
Glucose-Capillary: 232 mg/dL — ABNORMAL HIGH (ref 70–99)

## 2012-02-22 LAB — LEGIONELLA ANTIGEN, URINE

## 2012-02-22 LAB — CULTURE, RESPIRATORY W GRAM STAIN: Culture: NORMAL

## 2012-02-22 MED ORDER — INSULIN ASPART 100 UNIT/ML ~~LOC~~ SOLN
10.0000 [IU] | Freq: Once | SUBCUTANEOUS | Status: AC
  Administered 2012-02-22: 10 [IU] via SUBCUTANEOUS

## 2012-02-22 MED ORDER — ALBUTEROL SULFATE (5 MG/ML) 0.5% IN NEBU
2.5000 mg | INHALATION_SOLUTION | Freq: Four times a day (QID) | RESPIRATORY_TRACT | Status: DC
Start: 2012-02-22 — End: 2012-02-23
  Administered 2012-02-22 – 2012-02-23 (×3): 2.5 mg via RESPIRATORY_TRACT
  Filled 2012-02-22 (×3): qty 0.5

## 2012-02-22 NOTE — Progress Notes (Signed)
[facility] PROGRESS NOTE  Karla Rodriguez:[id number] DOB: 1965-12-24 DOA: 02/20/2012 PCP: Nilda Simmer, MD  Assessment/Plan: 1.CAP causing productive cough, shortness of breath and left-sided pleuritic chest pain along with shoulder pain - she was admitted and started on IV antibiotics, her chest pain and should der pain improved with pain medications. repeat   chest x-ray today showed persistent pneumonia on the left base with resolution of the right sided infiltarte., patient must follow with pulmonary post discharge her primary pulmonologist is Dr. Marcelyn Bruins to rule out a mass. She has been counseled to quit smoking .Marland Kitchen   2.DM I.currently in mild DKA with metabolic acidosis and wide anion gap. Resolved. Resume home  insulin pump, CBG (last 3)   Basename 02/22/12 1633 02/22/12 1109 02/22/12 0730  GLUCAP 201* 192* 232*       3. History of HTN, dyslipidemia and GERD. No acute issues continue home medications and monitor clinically.  4.Remote history of alcohol abuse. Patient has quit 3 years ago, reinforced to continue abstaining, she also is currently pack-a-day smoker have counseled her to quit smoking.  5.Hyponatremia. Resolved.  Likely due to #1 and #2 above, will check UA along with urine sodium and osmolality, serum osmolality, gentle IV fluids repeat BMP in the morning. Will also check Legionella urinary antigen.  DVT Prophylaxis Lovenox   Code Status: full code Family Communication: daughter at bedside Disposition Plan: possibly tmorrow.     Antibiotics:  Rocephin and zithromax from 1/ 12  HPI/Subjective: Pain int he left shoulder when takes a deep  Breath.  Objective: Filed Vitals:   02/21/12 1944 02/21/12 2234 02/22/12 0524 02/22/12 1355  BP:  98/63 104/58 107/63  Pulse: 73 74 75 88  Temp:  98 F (36.7 C) 98 F (36.7 C) 97.7 F (36.5 C)  TempSrc:  Oral Oral Oral  Resp: 18 18 18 18   Height:      Weight:      SpO2: 98% 96% 96% 98%    Intake/Output  Summary (Last 24 hours) at 02/22/12 1849 Last data filed at 02/22/12 1820  Gross per 24 hour  Intake    480 ml  Output   1550 ml  Net  -1070 ml   Filed Weights   02/20/12 2235  Weight: 95.255 kg (210 lb)    Exam:   General:  Alert afebrile comfortable  Cardiovascular: s1s2  Respiratory: CTAB  Abdomen: soft NT NDBS+  Data Reviewed: Basic Metabolic Panel:  Lab 02/22/12 8119 02/22/12 0836 02/21/12 0659 02/21/12 0141 02/20/12 1541  NA 137 138 131* 129* 126*  K 3.8 3.8 3.8 3.6 4.0  CL 105 107 99 97 93*  CO2 19 18* 17* 13* 16*  GLUCOSE 208* 217* 332* 372* 348*  BUN 9 10 14 11 8   CREATININE 0.52 0.52 0.67 0.67 0.62  CALCIUM 8.4 8.3* 8.1* 8.1* 8.8  MG -- -- 2.3 1.9 --  PHOS -- -- -- -- --   Liver Function Tests:  Lab 02/20/12 1541  AST 17  ALT 8  ALKPHOS 72  BILITOT 0.6  PROT 6.6  ALBUMIN 3.3*   No results found for this basename: LIPASE:5,AMYLASE:5 in the last 168 hours No results found for this basename: AMMONIA:5 in the last 168 hours CBC:  Lab 02/22/12 1034 02/21/12 0659 02/21/12 0141 02/20/12 1541  WBC 10.1 16.6* 18.7* 21.2*  NEUTROABS -- -- -- 18.1*  HGB 11.9* 12.5 11.8* 13.7  HCT 32.9* 35.5* 34.3* 39.0  MCV 81.4 83.7 84.1 83.9  PLT  229 188 177 180   Cardiac Enzymes:  Lab 02/20/12 1541  CKTOTAL --  CKMB --  CKMBINDEX --  TROPONINI <0.30   BNP (last 3 results) No results found for this basename: PROBNP:3 in the last 8760 hours CBG:  Lab 02/22/12 1633 02/22/12 1109 02/22/12 0730 02/22/12 0510 02/22/12 0309  GLUCAP 201* 192* 232* 336* 334*    Recent Results (from the past 240 hour(s))  URINE CULTURE     Status: Normal (Preliminary result)   Collection Time   02/20/12  7:27 PM      Component Value Range Status Comment   Specimen Description URINE, CLEAN CATCH   Final    Special Requests NONE   Final    Culture  Setup Time 02/21/2012 02:43   Final    Colony Count 90,000 COLONIES/ML   Final    Culture PROTEUS MIRABILIS   Final    Report  Status PENDING   Incomplete   CULTURE, BLOOD (ROUTINE X 2)     Status: Normal (Preliminary result)   Collection Time   02/20/12  7:30 PM      Component Value Range Status Comment   Specimen Description BLOOD RIGHT HAND   Final    Special Requests BOTTLES DRAWN AEROBIC AND ANAEROBIC   Final    Culture  Setup Time 02/21/2012 02:16   Final    Culture     Final    Value:        BLOOD CULTURE RECEIVED NO GROWTH TO DATE CULTURE WILL BE HELD FOR 5 DAYS BEFORE ISSUING A FINAL NEGATIVE REPORT   Report Status PENDING   Incomplete   CULTURE, BLOOD (ROUTINE X 2)     Status: Normal (Preliminary result)   Collection Time   02/20/12  7:45 PM      Component Value Range Status Comment   Specimen Description BLOOD RIGHT HAND   Final    Special Requests BOTTLES DRAWN AEROBIC AND ANAEROBIC   Final    Culture  Setup Time 02/21/2012 02:17   Final    Culture     Final    Value:        BLOOD CULTURE RECEIVED NO GROWTH TO DATE CULTURE WILL BE HELD FOR 5 DAYS BEFORE ISSUING A FINAL NEGATIVE REPORT   Report Status PENDING   Incomplete   CULTURE, EXPECTORATED SPUTUM-ASSESSMENT     Status: Normal   Collection Time   02/20/12  8:00 PM      Component Value Range Status Comment   Specimen Description SPUTUM   Final    Special Requests NONE   Final    Sputum evaluation     Final    Value: THIS SPECIMEN IS ACCEPTABLE. RESPIRATORY CULTURE REPORT TO FOLLOW.   Report Status 02/21/2012 FINAL   Final   CULTURE, RESPIRATORY     Status: Normal   Collection Time   02/20/12  8:00 PM      Component Value Range Status Comment   Specimen Description SPUTUM   Final    Special Requests NONE   Final    Gram Stain     Final    Value: ABUNDANT WBC PRESENT, PREDOMINANTLY PMN     ABUNDANT SQUAMOUS EPITHELIAL CELLS PRESENT     ABUNDANT GRAM POSITIVE COCCI     IN PAIRS IN CHAINS ABUNDANT GRAM POSITIVE RODS     FEW GRAM NEGATIVE RODS   Culture NORMAL OROPHARYNGEAL FLORA   Final    Report Status 02/22/2012 FINAL  Final       Studies: Dg Chest 2 View  02/22/2012  *RADIOLOGY REPORT*  Clinical Data: Pneumonia; cough and shortness of breath  CHEST - 2 VIEW  Comparison: Chest radiograph July 12, 2011; chest CT February 20, 2012  Findings: There remains consolidation in the left lower lobe, primarily posteriorly.  Right lung appears clear.  Heart size and pulmonary vascularity are normal.  No adenopathy.  IMPRESSION: Persistent left lower lobe consolidation.  Right lung now clear. No new opacity.   Original Report Authenticated By: Bretta Bang, M.D.     Scheduled Meds:    . albuterol  2.5 mg Nebulization QID  . ALPRAZolam  0.25 mg Oral BID  . asenapine  5 mg Sublingual QHS  . atorvastatin  10 mg Oral q1800  . azelastine  2 spray Each Nare Daily  . azithromycin  500 mg Intravenous Q24H  . cefTRIAXone (ROCEPHIN)  IV  1 g Intravenous Q24H  . enoxaparin (LOVENOX) injection  40 mg Subcutaneous Q24H  . escitalopram  20 mg Oral q morning - 10a  . insulin aspart  10 Units Subcutaneous Once  . insulin glargine  10 Units Subcutaneous QHS  . insulin pump   Subcutaneous Q4H  . insulin regular  0-10 Units Intravenous TID WC  . lamoTRIgine  50 mg Oral Daily  . lisinopril  10 mg Oral QHS  . loratadine  10 mg Oral Daily  . mometasone-formoterol  2 puff Inhalation BID  . montelukast  10 mg Oral QHS  . pantoprazole  40 mg Oral Daily  . ramelteon  8 mg Oral QHS   Continuous Infusions:    . sodium chloride Stopped (02/22/12 0955)  . insulin (NOVOLIN-R) infusion Stopped (02/22/12 0000)    Principal Problem:  *CAP (community acquired pneumonia) Active Problems:  DIABETES MELLITUS, TYPE I  HYPERCHOLESTEROLEMIA  ESSENTIAL HYPERTENSION  GERD  Gastroparesis  ANEMIA, IRON DEFICIENCY    Time spent: 28 min    Leshon Armistead  [facility] Pager [phone] If 8PM-8AM, please contact night-coverage at [email or web address], password [facility] 02/22/2012, 6:49 PM  LOS: 2 days

## 2012-02-22 NOTE — Progress Notes (Signed)
Inpatient Diabetes Program Recommendations  AACE/ADA: New Consensus Statement on Inpatient Glycemic Control (2013)  Target Ranges:  Prepandial:   less than 140 mg/dL      Peak postprandial:   less than 180 mg/dL (1-2 hours)      Critically ill patients:  140 - 180 mg/dL   Spoke with patient this afternoon to inquire about her infusion set site for her insulin pump. Pt states she is using the same site (mid-abdomen) as when she came in and feels that it should be sufficient to work until needed to be changed. Encouraged her to change out her site soon, and asap if her glucose levels do not respond to her correction boluses. Pt agreed that she would and has her supplies at her bedside. Thank you, Lenor Coffin, RN, CNS, Diabetes Coordinator [phone])

## 2012-02-23 DIAGNOSIS — E1110 Type 2 diabetes mellitus with ketoacidosis without coma: Secondary | ICD-10-CM

## 2012-02-23 DIAGNOSIS — E1010 Type 1 diabetes mellitus with ketoacidosis without coma: Secondary | ICD-10-CM

## 2012-02-23 LAB — BASIC METABOLIC PANEL
BUN: 8 mg/dL (ref 6–23)
CO2: 23 mEq/L (ref 19–32)
Chloride: 106 mEq/L (ref 96–112)
Creatinine, Ser: 0.5 mg/dL (ref 0.50–1.10)
Glucose, Bld: 229 mg/dL — ABNORMAL HIGH (ref 70–99)

## 2012-02-23 LAB — GLUCOSE, CAPILLARY
Glucose-Capillary: 180 mg/dL — ABNORMAL HIGH (ref 70–99)
Glucose-Capillary: 248 mg/dL — ABNORMAL HIGH (ref 70–99)

## 2012-02-23 MED ORDER — LEVOFLOXACIN 500 MG PO TABS: 750.0000 mg | ORAL_TABLET | Freq: Every day | ORAL | Status: DC

## 2012-02-23 MED ORDER — OXYCODONE-ACETAMINOPHEN 5-325 MG PO TABS: 1.0000 | ORAL_TABLET | Freq: Once | ORAL | Status: DC

## 2012-02-23 NOTE — Progress Notes (Signed)
Pt provided with dc instructions and education. Pt verbalized understanding. Pt has no questions about medicatinos at this time. Pt denies CP/SOB. VSS. IV removed with tip intact. Heart monitor cleaned and returned to front. Pt leaving with significant other for home. Levonne Spiller, RN

## 2012-02-23 NOTE — Discharge Summary (Signed)
Physician Discharge Summary  Patient ID: Karla Rodriguez MRN: [id number] DOB/AGE: Jan 28, 1966 47 y.o.  Admit date: 02/20/2012 Discharge date: 02/23/2012  Primary Care Physician:  Nilda Simmer, MD   Discharge Diagnoses:    Principal Problem:  *CAP (community acquired pneumonia) Active Problems:  DIABETES MELLITUS, TYPE I  HYPERCHOLESTEROLEMIA  ESSENTIAL HYPERTENSION  GERD  Gastroparesis  ANEMIA, IRON DEFICIENCY  DKA (diabetic ketoacidoses)      Medication List     As of 02/23/2012  4:39 PM    STOP taking these medications         glucose blood test strip      UNABLE TO FIND      TAKE these medications         ALPRAZolam 0.5 MG tablet   Commonly known as: XANAX   Take 0.25 mg by mouth at bedtime as needed. As needed for anxiety and to help sleep.      asenapine 5 MG Subl   Commonly known as: SAPHRIS   Place 5 mg under the tongue at bedtime.      cetirizine 10 MG tablet   Commonly known as: ZYRTEC   Take 10 mg by mouth at bedtime.      ciclesonide 50 MCG/ACT nasal spray   Commonly known as: OMNARIS   Place 2 sprays into both nostrils as needed.      escitalopram 20 MG tablet   Commonly known as: LEXAPRO   Take 20 mg by mouth every morning.      fluticasone-salmeterol 45-21 MCG/ACT inhaler   Commonly known as: ADVAIR HFA   Inhale 2 puffs into the lungs 2 (two) times daily.      ibuprofen 200 MG tablet   Commonly known as: ADVIL,MOTRIN   Take 800 mg by mouth every 6 (six) hours as needed. For pain.      insulin lispro 100 UNIT/ML injection   Commonly known as: HUMALOG   Via Insulin pump      LAMICTAL ODT 50 MG Tbdp   Generic drug: LamoTRIgine   Take 50 mg by mouth daily.      levofloxacin 500 MG tablet   Commonly known as: LEVAQUIN   Take 1.5 tablets (750 mg total) by mouth daily.      lisinopril 10 MG tablet   Commonly known as: PRINIVIL,ZESTRIL   Take 10 mg by mouth at bedtime.      montelukast 10 MG tablet   Commonly known as: SINGULAIR   Take 10 mg by mouth at bedtime.      NEXIUM 40 MG capsule   Generic drug: esomeprazole   Take 40 mg by mouth at bedtime. Once daily      oxyCODONE-acetaminophen 5-325 MG per tablet   Commonly known as: PERCOCET/ROXICET   Take 1 tablet by mouth once.      PROVENTIL HFA 108 (90 BASE) MCG/ACT inhaler   Generic drug: albuterol   Inhale 2 puffs into the lungs every 6 (six) hours as needed. As needed for shortness of breath.      albuterol (2.5 MG/3ML) 0.083% nebulizer solution   Commonly known as: PROVENTIL   Take 3 mLs (2.5 mg total) by nebulization every 6 (six) hours as needed for wheezing.      ramelteon 8 MG tablet   Commonly known as: ROZEREM   Take 8 mg by mouth at bedtime as needed. As needed for sleep.      rosuvastatin 20 MG tablet   Commonly known as: CRESTOR   Take  20 mg by mouth at bedtime.      VITAMIN D PO   Take 50,000 Units by mouth once a week. Sunday           Disposition and Follow-up:  Will be discharged home today in stable and improved condition. Has been advised to follow up with her PCP in 2 weeks. I would recommend a repeat CXR in 4-6 weeks to ensure complete resolution of her PNA.  Consults:  None   Significant Diagnostic Studies:  Dg Chest 2 View  02/22/2012  *RADIOLOGY REPORT*  Clinical Data: Pneumonia; cough and shortness of breath  CHEST - 2 VIEW  Comparison: Chest radiograph July 12, 2011; chest CT February 20, 2012  Findings: There remains consolidation in the left lower lobe, primarily posteriorly.  Right lung appears clear.  Heart size and pulmonary vascularity are normal.  No adenopathy.  IMPRESSION: Persistent left lower lobe consolidation.  Right lung now clear. No new opacity.   Original Report Authenticated By: Bretta Bang, M.D.    Dg Shoulder Left  02/22/2012  *RADIOLOGY REPORT*  Clinical Data: Shoulder pain without injury  LEFT SHOULDER - 2+ VIEW  Comparison: None  Findings: Normal alignment.  Negative for fracture.  Glenohumeral  joint is normal.  No focal bony lesion.  IMPRESSION: No acute abnormality.  Mild degenerative change in the [facility] joint.   Original Report Authenticated By: Janeece Riggers, M.D.     Brief H and P: For complete details please refer to admission H and P, but in brief patient is a 47 y.o. female, who is an active smoker, history of DM type I, hypertension, dyslipidemia and GERD who is a recovering alcoholic, presents to the hospital with one to 2 day history of productive cough, low-grade fevers along with left-sided chest wall and shoulder pleuritic pain, pain is worse with taking deep breaths and coughing, associated with shortness of breath, she presented to the ER where her workup including CT scan of the chest showed a large left upper lobe consolidation, she had leukocytosis and hyponatremia, blood sugars are elevated with mild DKA. We were asked to admit her for further evaluation and management.     Hospital Course:  Principal Problem:  *CAP (community acquired pneumonia) Active Problems:  DIABETES MELLITUS, TYPE I  HYPERCHOLESTEROLEMIA  ESSENTIAL HYPERTENSION  GERD  Gastroparesis  ANEMIA, IRON DEFICIENCY  DKA (diabetic ketoacidoses)   CAP -Placed on rocephin/azithro. -Is now afebrile, without leukocytosis. -Will discharge on 5 more days of PO levaquin.  DKA -Resolved. -Bicarb is 23. -She has been placed back on her insulin pump.  Rest of chronic medical conditions have been stable this hospitalization.   Time spent on Discharge: Greater than 30 minutes.  SignedChaya Jan [facility] Pager: [phone] 02/23/2012, 4:39 PM

## 2012-02-27 LAB — CULTURE, BLOOD (ROUTINE X 2)
Culture: NO GROWTH
Culture: NO GROWTH

## 2012-03-10 ENCOUNTER — Ambulatory Visit: Payer: Managed Care, Other (non HMO)

## 2012-03-10 ENCOUNTER — Ambulatory Visit (INDEPENDENT_AMBULATORY_CARE_PROVIDER_SITE_OTHER): Payer: Managed Care, Other (non HMO) | Admitting: Family Medicine

## 2012-03-10 VITALS — BP 119/76 | HR 84 | Temp 99.2°F | Resp 17 | Ht 65.5 in | Wt 207.0 lb

## 2012-03-10 DIAGNOSIS — R5381 Other malaise: Secondary | ICD-10-CM

## 2012-03-10 DIAGNOSIS — J189 Pneumonia, unspecified organism: Secondary | ICD-10-CM

## 2012-03-10 DIAGNOSIS — R5383 Other fatigue: Secondary | ICD-10-CM

## 2012-03-10 DIAGNOSIS — E109 Type 1 diabetes mellitus without complications: Secondary | ICD-10-CM

## 2012-03-10 LAB — GLUCOSE, POCT (MANUAL RESULT ENTRY): POC Glucose: 148 mg/dl — AB (ref 70–99)

## 2012-03-10 LAB — POCT CBC
HCT, POC: 39.8 % (ref 37.7–47.9)
Hemoglobin: 13 g/dL (ref 12.2–16.2)
Lymph, poc: 1.3 (ref 0.6–3.4)
MCH, POC: 29 pg (ref 27–31.2)
MCHC: 32.7 g/dL (ref 31.8–35.4)
MCV: 88.8 fL (ref 80–97)
POC Granulocyte: 4.4 (ref 2–6.9)
WBC: 6.3 10*3/uL (ref 4.6–10.2)

## 2012-03-10 MED ORDER — CEFDINIR 300 MG PO CAPS: 300.0000 mg | ORAL_CAPSULE | Freq: Two times a day (BID) | ORAL | Status: DC
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
## 2012-03-10 NOTE — Patient Instructions (Addendum)
We are going to put your back on antibotics as your pneumonia may be coming back.  Have a low threshold to seek care again- if you develop a fever, feel worse, or have any change in your shoulder pain.

## 2012-04-03 ENCOUNTER — Telehealth: Payer: Self-pay

## 2012-04-03 MED ORDER — LISINOPRIL 10 MG PO TABS: 10.0000 mg | ORAL_TABLET | Freq: Every day | ORAL | Status: DC

## 2012-04-03 NOTE — Telephone Encounter (Signed)
Pt states [facility] has contacted [place] about her lisinopril and have not heard back. Pt is out of rx now.  Please call:  [phone]  bf

## 2012-04-03 NOTE — Telephone Encounter (Signed)
Sending in RFs of her lisinopril to [facility]. I don't see any record of request from [facility].  LMOM for pt to CB. Ask pt if she wants any lisinopril sent in somewhere locally. Lisinopril 10 mg is on the $4 list at [facility].

## 2012-04-03 NOTE — Telephone Encounter (Signed)
[facility] called because rx written for Nexium 40 mg adn that requires pre-auth. Would like to know if you would like to switch to omeprazole, pantoprazole, or Lansoprazole. Please advise ph# [phone] ref# [id number]

## 2012-04-04 ENCOUNTER — Other Ambulatory Visit: Payer: Self-pay | Admitting: Radiology

## 2012-04-04 NOTE — Telephone Encounter (Signed)
Patient advised Rx for lisinopril was sent yesterday.

## 2012-04-05 ENCOUNTER — Telehealth: Payer: Self-pay

## 2012-04-05 NOTE — Telephone Encounter (Signed)
omeprazole, pantoprazole, or Lansoprazole.are on his list please advise

## 2012-04-05 NOTE — Telephone Encounter (Signed)
[facility] CALLED AND SAID THAT NEXIUM IS NOT ON THE PT. PREFERRED DRUG LIST. THERE ARE THREE OTHER MEDICATIONS THAT ARE ON THE LIST AND THEY WOULD LIKE TO KNOW IF WE COULD CHANGE IT TO ONE OF THOSE. REF # [id number] [facility] # [phone]

## 2012-04-06 MED ORDER — PANTOPRAZOLE SODIUM 40 MG PO TBEC: 40.0000 mg | DELAYED_RELEASE_TABLET | Freq: Every day | ORAL | Status: DC

## 2012-04-06 NOTE — Telephone Encounter (Signed)
Please call in:  Pantoprozole 40mg  one daily for reflux #90 3 refills to [facility].  Thanks, [employer]

## 2012-04-06 NOTE — Telephone Encounter (Signed)
[facility] CB to check on this. I called pt to ask her h/o PPI med trials and she reported that she has tried/failed OTC Prilosec, but she has not ever tried pantoprozole or lansoprazole and is willing to try either that you think would work the best. Please advise. [facility] stated it is quicker if we call back to change this rather than sending in another Rx, so I will be happy to do this.

## 2012-04-06 NOTE — Telephone Encounter (Signed)
Pt agreeable to trying alternative; rx for Pantoprazole to be called into [facility].  KMS

## 2012-04-10 ENCOUNTER — Encounter: Payer: Managed Care, Other (non HMO) | Admitting: Family Medicine

## 2012-04-24 ENCOUNTER — Encounter: Payer: Self-pay | Admitting: Family Medicine

## 2012-09-15 ENCOUNTER — Encounter: Payer: Self-pay | Admitting: Radiology

## 2012-09-25 ENCOUNTER — Ambulatory Visit: Payer: Managed Care, Other (non HMO) | Admitting: Family Medicine

## 2012-10-07 ENCOUNTER — Other Ambulatory Visit: Payer: Self-pay | Admitting: Physician Assistant

## 2013-02-11 ENCOUNTER — Other Ambulatory Visit: Payer: Self-pay | Admitting: Physician Assistant

## 2013-03-12 ENCOUNTER — Encounter: Payer: Self-pay | Admitting: Family Medicine

## 2013-03-12 ENCOUNTER — Ambulatory Visit (INDEPENDENT_AMBULATORY_CARE_PROVIDER_SITE_OTHER): Payer: Managed Care, Other (non HMO) | Admitting: Family Medicine

## 2013-03-12 VITALS — BP 120/82 | HR 96 | Temp 98.0°F | Resp 16 | Ht 64.75 in | Wt 215.4 lb

## 2013-03-12 DIAGNOSIS — F319 Bipolar disorder, unspecified: Secondary | ICD-10-CM

## 2013-03-12 DIAGNOSIS — I10 Essential (primary) hypertension: Secondary | ICD-10-CM

## 2013-03-12 DIAGNOSIS — E109 Type 1 diabetes mellitus without complications: Secondary | ICD-10-CM

## 2013-03-12 DIAGNOSIS — F1021 Alcohol dependence, in remission: Secondary | ICD-10-CM

## 2013-03-12 DIAGNOSIS — K5900 Constipation, unspecified: Secondary | ICD-10-CM

## 2013-03-12 DIAGNOSIS — K589 Irritable bowel syndrome without diarrhea: Secondary | ICD-10-CM

## 2013-03-12 MED ORDER — LISINOPRIL 10 MG PO TABS: 10.0000 mg | ORAL_TABLET | Freq: Every day | ORAL | Status: DC
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
## 2013-03-14 DIAGNOSIS — F1021 Alcohol dependence, in remission: Secondary | ICD-10-CM | POA: Insufficient documentation

## 2013-03-14 DIAGNOSIS — F319 Bipolar disorder, unspecified: Secondary | ICD-10-CM | POA: Insufficient documentation

## 2013-03-14 DIAGNOSIS — K5900 Constipation, unspecified: Secondary | ICD-10-CM | POA: Insufficient documentation

## 2013-03-25 IMAGING — CR DG CHEST 2V
2 series · 2 of 2 positions shown · non-contrast
Comparison: None.

CLINICAL DATA: Shortness of breath

CHEST - 2 VIEW

[PA]
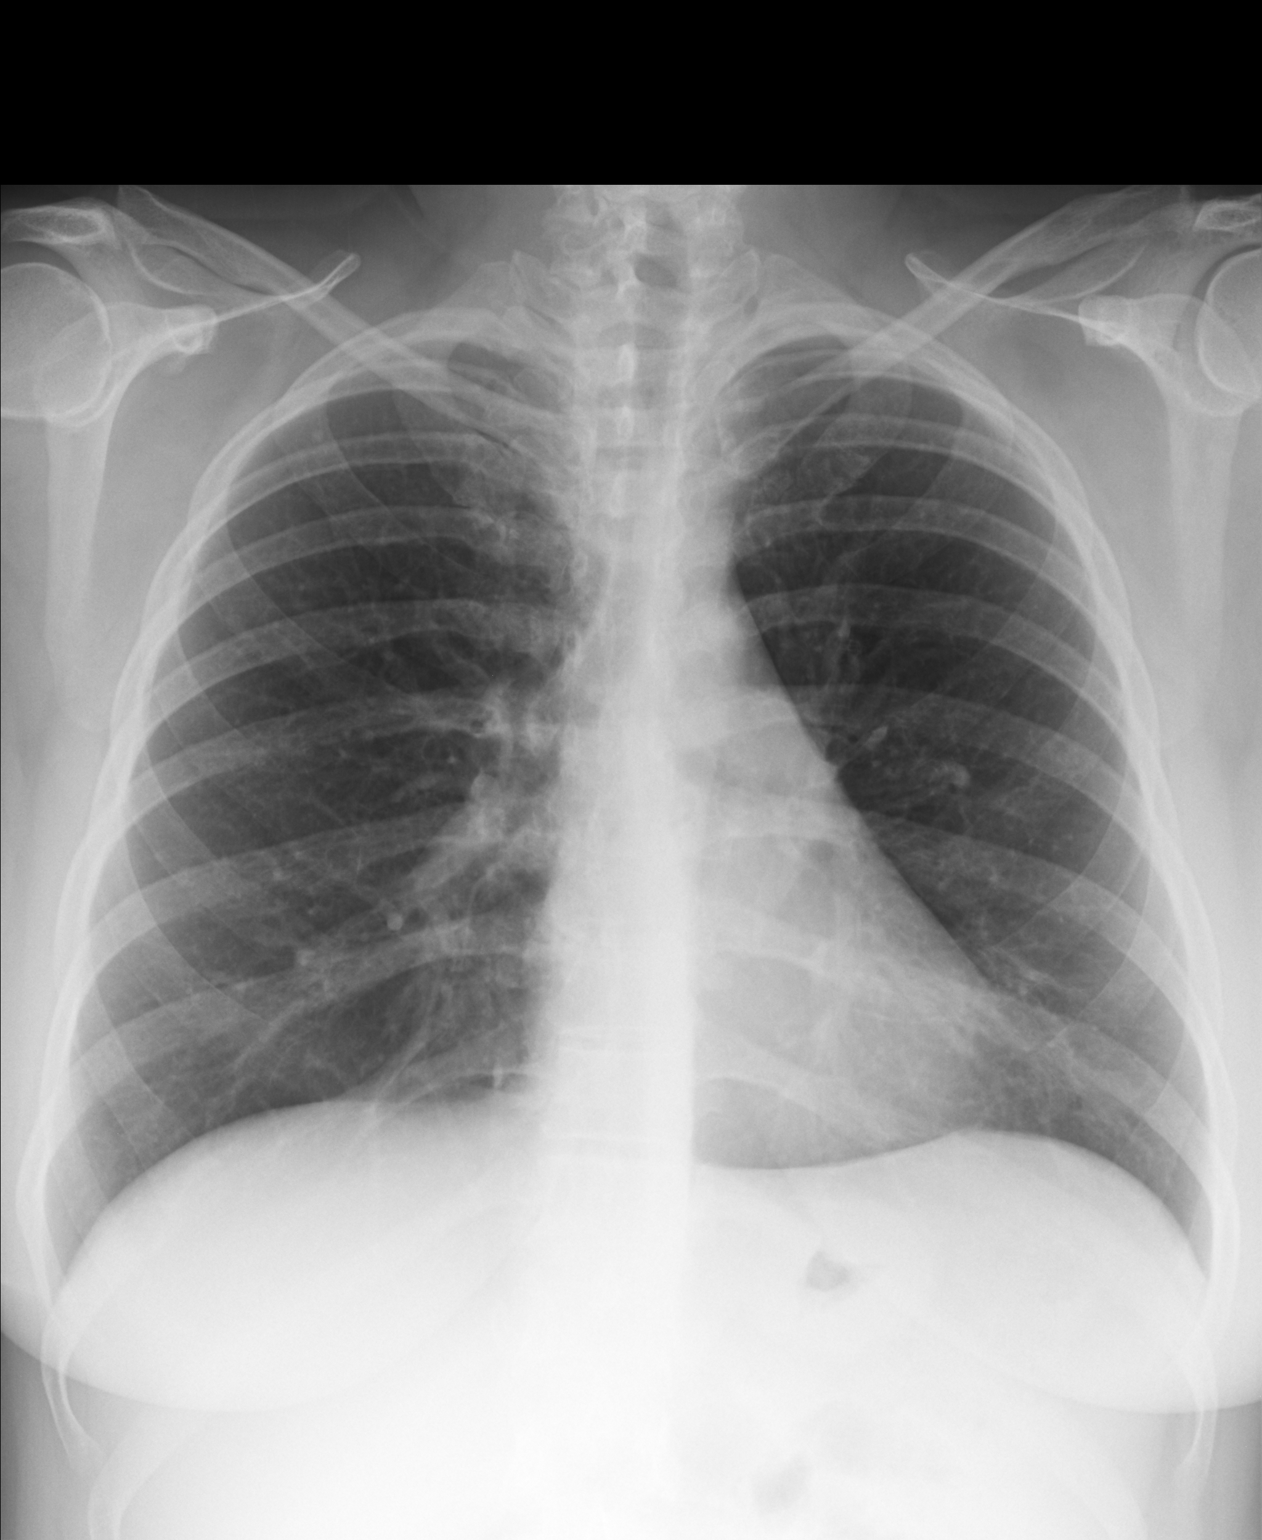

[lateral]
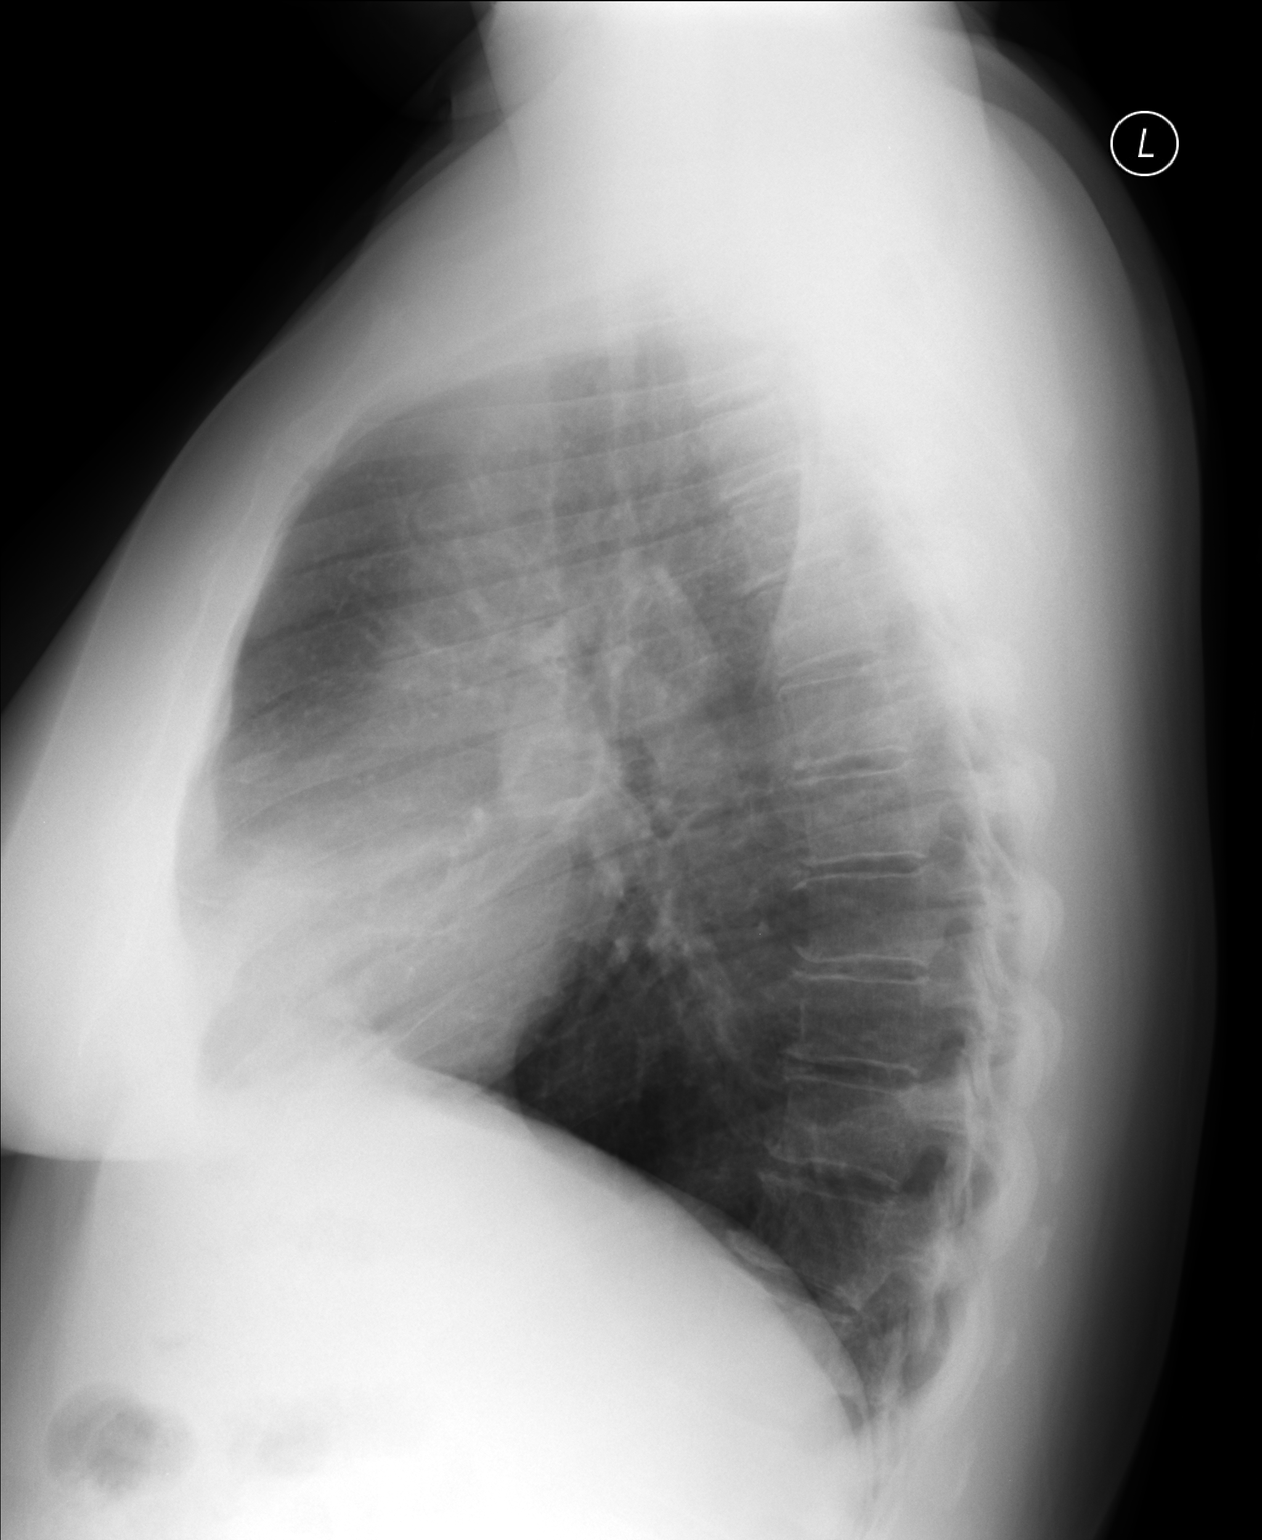

[2 of 2 positions shown; findings below may reference images not displayed]

FINDINGS: Tiny granuloma versus bone island within the right upper
lobe projecting over the anterior right second rib.  Lungs are
otherwise clear. No pleural effusion or pneumothorax. The
cardiomediastinal contours are within normal limits. The visualized
bones and soft tissues are without significant appreciable
abnormality.
IMPRESSION: No radiographic evidence of acute cardiopulmonary process.

## 2013-04-01 ENCOUNTER — Other Ambulatory Visit: Payer: Self-pay | Admitting: Family Medicine

## 2013-05-09 ENCOUNTER — Ambulatory Visit (INDEPENDENT_AMBULATORY_CARE_PROVIDER_SITE_OTHER): Payer: Managed Care, Other (non HMO) | Admitting: Family Medicine

## 2013-05-09 VITALS — BP 108/70 | HR 87 | Temp 98.1°F | Resp 18 | Ht 64.0 in | Wt 213.8 lb

## 2013-05-09 DIAGNOSIS — F17200 Nicotine dependence, unspecified, uncomplicated: Secondary | ICD-10-CM

## 2013-05-09 DIAGNOSIS — J0100 Acute maxillary sinusitis, unspecified: Secondary | ICD-10-CM

## 2013-05-09 DIAGNOSIS — J309 Allergic rhinitis, unspecified: Secondary | ICD-10-CM

## 2013-05-09 DIAGNOSIS — J45901 Unspecified asthma with (acute) exacerbation: Secondary | ICD-10-CM

## 2013-05-09 DIAGNOSIS — E109 Type 1 diabetes mellitus without complications: Secondary | ICD-10-CM

## 2013-05-09 MED ORDER — AZITHROMYCIN 250 MG PO TABS: ORAL_TABLET | ORAL | Status: DC

## 2013-05-09 MED ORDER — ALBUTEROL SULFATE (2.5 MG/3ML) 0.083% IN NEBU: 2.5000 mg | INHALATION_SOLUTION | Freq: Four times a day (QID) | RESPIRATORY_TRACT | Status: DC | PRN

## 2013-05-09 MED ORDER — ALBUTEROL SULFATE HFA 108 (90 BASE) MCG/ACT IN AERS: 2.0000 | INHALATION_SPRAY | Freq: Four times a day (QID) | RESPIRATORY_TRACT | Status: DC | PRN

## 2013-05-09 MED ORDER — PREDNISONE 20 MG PO TABS: 20.0000 mg | ORAL_TABLET | Freq: Every day | ORAL | Status: DC
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
## 2013-05-09 NOTE — Patient Instructions (Signed)
1.  Start Flonase nasal spray daily.  Continue Flonase through June. 2.  Increase nebulizer to three times daily. 3.  Use rescue inhaler mid-day at work. 4.  Call if not a lot better in 5 days.

## 2013-05-09 NOTE — Addendum Note (Signed)
Addended by: Wardell Honour on: 05/09/2013 06:14 PM   Modules accepted: Orders

## 2013-05-17 ENCOUNTER — Ambulatory Visit: Payer: Managed Care, Other (non HMO)

## 2013-05-17 ENCOUNTER — Ambulatory Visit (INDEPENDENT_AMBULATORY_CARE_PROVIDER_SITE_OTHER): Payer: Managed Care, Other (non HMO) | Admitting: Family Medicine

## 2013-05-17 VITALS — BP 120/80 | HR 90 | Temp 98.7°F | Resp 16 | Ht 63.0 in | Wt 213.0 lb

## 2013-05-17 DIAGNOSIS — R5383 Other fatigue: Principal | ICD-10-CM

## 2013-05-17 DIAGNOSIS — J209 Acute bronchitis, unspecified: Secondary | ICD-10-CM

## 2013-05-17 DIAGNOSIS — R5381 Other malaise: Secondary | ICD-10-CM

## 2013-05-17 DIAGNOSIS — J45901 Unspecified asthma with (acute) exacerbation: Secondary | ICD-10-CM

## 2013-05-17 DIAGNOSIS — J309 Allergic rhinitis, unspecified: Secondary | ICD-10-CM

## 2013-05-17 DIAGNOSIS — E109 Type 1 diabetes mellitus without complications: Secondary | ICD-10-CM

## 2013-05-17 LAB — POCT CBC
Granulocyte percent: 85.4 %G — AB (ref 37–80)
HEMATOCRIT: 47.9 % (ref 37.7–47.9)
HEMOGLOBIN: 15.7 g/dL (ref 12.2–16.2)
LYMPH, POC: 1.6 (ref 0.6–3.4)
MCH: 31 pg (ref 27–31.2)
MCHC: 32.8 g/dL (ref 31.8–35.4)
MCV: 94.7 fL (ref 80–97)
MID (cbc): 0.7 (ref 0–0.9)
MPV: 10.1 fL (ref 0–99.8)
POC GRANULOCYTE: 13.2 — AB (ref 2–6.9)
POC LYMPH PERCENT: 10.4 %L (ref 10–50)
POC MID %: 4.2 %M (ref 0–12)
Platelet Count, POC: 361 10*3/uL (ref 142–424)
RBC: 5.06 M/uL (ref 4.04–5.48)
RDW, POC: 13.7 %
WBC: 15.5 10*3/uL — AB (ref 4.6–10.2)

## 2013-05-17 MED ORDER — BENZONATATE 100 MG PO CAPS: 100.0000 mg | ORAL_CAPSULE | Freq: Three times a day (TID) | ORAL | Status: DC | PRN

## 2013-05-17 MED ORDER — PREDNISONE 20 MG PO TABS: ORAL_TABLET | ORAL | Status: DC

## 2013-05-17 MED ORDER — CEFDINIR 300 MG PO CAPS: 600.0000 mg | ORAL_CAPSULE | Freq: Every day | ORAL | Status: DC

## 2013-05-17 NOTE — Progress Notes (Addendum)
Patient ID: Karla Rodriguez, female   DOB: 12-Jan-1966, 48 y.o.   MRN: [id number]  This chart was scribed for Wardell Honour, MD by Marcha Dutton, ED Scribe. This patient was seen in room 1 and the patient's care was started at 4:36 PM.  Subjective:    Patient ID: Karla Rodriguez, female    DOB: [birth year], 48 y.o.   MRN: [id number]  05/17/2013  Follow-up   HPI HPI Comments: Karla Rodriguez is a 48 y.o. female who presents to the [facility] for a one week follow up appointment. She states she feels SOB, wheezes, mild congestion, HA, sore throat, and achiness. Pt denies fever, chills, sweats, rhinorrhea, tender glands, sinus pressure. She reports she has used her nebulizer bid and her inhaler once daily. She states she finished the prednisone today and that her blood sugars ran around 200 with the steroid. She states she's been using Flonase as well with some improvement. She also reports she has been going to work. She's unsure if she feels as bad as when she had pneumonia. She states her cough has been keeping her up and she feels very tired. She is taking nothing for cough.  She complete the Zpack as directed.     Review of Systems  Constitutional: Negative for fever, chills and diaphoresis.  HENT: Positive for congestion (mild), postnasal drip, sore throat and voice change. Negative for ear pain, rhinorrhea, sinus pressure and trouble swallowing.   Eyes: Negative for visual disturbance.  Respiratory: Positive for shortness of breath and wheezing.   Endocrine: Negative for polydipsia, polyphagia and polyuria.  Neurological: Positive for headaches.    Past Medical History  Diagnosis Date  . Leiomyoma of uterus   . Skin benign neoplasm   . Alcohol abuse   . IBS (irritable bowel syndrome)   . Shoulder pain   . GERD (gastroesophageal reflux disease)   . Allergic rhinitis   . Gastroparesis   . Pure hypercholesterolemia   . Hiatal hernia   . Hypertension     saw Dr.  Claiborne Billings- a couple of yrs. ago, stress test- wnl, no need for F/U  . Sleep apnea     CPAP, sleep study, long ago- uses CPAP q night   . Anemia     prior to hysterectomy   . Anxiety     h/o panic attacks   . Constipation     pt. tends to get constipated very easily, escpecially with pain meds   . Asthma     "allergy related and controlled"  . Type I diabetes mellitus 2008     insulin pump   . Osteoarthritis of knee     left  . OCD (obsessive compulsive disorder)   . Bipolar depression     "no psychotic features"  . Depression     BIPOLAR- Dr. Candis Schatz  . Palpitations   . Hematemesis   . Tobacco use disorder   . Unspecified hemorrhoids without mention of complication   . Chicken pox   . Insomnia    No Known Allergies Current Outpatient Prescriptions  Medication Sig Dispense Refill  . albuterol (PROVENTIL HFA) 108 (90 BASE) MCG/ACT inhaler Inhale 2 puffs into the lungs every 6 (six) hours as needed. As needed for shortness of breath.  18 g  3  . ALPRAZolam (XANAX) 0.5 MG tablet Take 0.25 mg by mouth at bedtime as needed. As needed for anxiety and to help sleep.      Marland Kitchen  cetirizine (ZYRTEC) 10 MG tablet Take 10 mg by mouth at bedtime.       Marland Kitchen esomeprazole (NEXIUM) 40 MG capsule Take 40 mg by mouth at bedtime. Once daily      . insulin lispro (HUMALOG) 100 UNIT/ML injection Via Insulin pump      . LamoTRIgine (LAMICTAL ODT) 50 MG TBDP Take 50 mg by mouth daily.      Marland Kitchen lisinopril (PRINIVIL,ZESTRIL) 10 MG tablet Take 1 tablet (10 mg total) by mouth daily. Needs office visit  90 tablet  3  . montelukast (SINGULAIR) 10 MG tablet Take 10 mg by mouth at bedtime.       . pantoprazole (PROTONIX) 40 MG tablet TAKE 1 TABLET DAILY  90 tablet  1  . PRESCRIPTION MEDICATION Seroquel 600 mg taking once at night      . ramelteon (ROZEREM) 8 MG tablet Take 8 mg by mouth at bedtime as needed. As needed for sleep.      . rosuvastatin (CRESTOR) 20 MG tablet Take 20 mg by mouth at bedtime.       Marland Kitchen  albuterol (PROVENTIL) (2.5 MG/3ML) 0.083% nebulizer solution Take 3 mLs (2.5 mg total) by nebulization every 6 (six) hours as needed for wheezing.  75 mL  12  . benzonatate (TESSALON) 100 MG capsule Take 1-2 capsules (100-200 mg total) by mouth 3 (three) times daily as needed for cough.  40 capsule  0  . cefdinir (OMNICEF) 300 MG capsule Take 2 capsules (600 mg total) by mouth daily.  20 capsule  0  . fluticasone-salmeterol (ADVAIR HFA) 45-21 MCG/ACT inhaler Inhale 2 puffs into the lungs 2 (two) times daily.      . predniSONE (DELTASONE) 20 MG tablet Two tablets daily x 5 days then one tablet daily x 3 days  13 tablet  0   No current facility-administered medications for this visit.       Objective:    Triage Vitals: BP 120/80  Pulse 90  Temp(Src) 98.7 F (37.1 C) (Oral)  Resp 16  Ht 5\' 3"  (1.6 m)  Wt 213 lb (96.616 kg)  BMI 37.74 kg/m2  SpO2 96%  Physical Exam  Constitutional: She is oriented to person, place, and time. She appears well-developed and well-nourished.  HENT:  Head: Normocephalic and atraumatic.  Right Ear: External ear normal.  Left Ear: External ear normal.  Nose: Nose normal. Right sinus exhibits no maxillary sinus tenderness and no frontal sinus tenderness. Left sinus exhibits no maxillary sinus tenderness and no frontal sinus tenderness.  Mouth/Throat: Oropharynx is clear and moist. No oropharyngeal exudate.  Drainage in bilateral nares  Eyes: EOM are normal.  Neck: Normal range of motion. Neck supple.  Cardiovascular: Normal rate, regular rhythm and normal heart sounds.   No murmur heard. Pulmonary/Chest: Effort normal. She has wheezes (on expiration). She has no rales.  Abdominal: Soft. Bowel sounds are normal. She exhibits no distension. There is no tenderness. There is no rebound.  Neurological: She is alert and oriented to person, place, and time. She has normal reflexes.  Skin: Skin is warm and dry.  Psychiatric: She has a normal mood and affect. Her  behavior is normal.   Results for orders placed in visit on 05/17/13  POCT CBC      Result Value Ref Range   WBC 15.5 (*) 4.6 - 10.2 K/uL   Lymph, poc 1.6  0.6 - 3.4   POC LYMPH PERCENT 10.4  10 - 50 %L   MID (cbc)  0.7  0 - 0.9   POC MID % 4.2  0 - 12 %M   POC Granulocyte 13.2 (*) 2 - 6.9   Granulocyte percent 85.4 (*) 37 - 80 %G   RBC 5.06  4.04 - 5.48 M/uL   Hemoglobin 15.7  12.2 - 16.2 g/dL   HCT, POC 47.9  37.7 - 47.9 %   MCV 94.7  80 - 97 fL   MCH, POC 31.0  27 - 31.2 pg   MCHC 32.8  31.8 - 35.4 g/dL   RDW, POC 13.7     Platelet Count, POC 361  142 - 424 K/uL   MPV 10.1  0 - 99.8 fL     PEAK FLOW:  300, 300, 280  [facility] reading (PRIMARY) by  Dr. Tamala Julian.  CXR: NAD   Assessment & Plan:  Other malaise and fatigue - Plan: POCT CBC  Asthma with acute exacerbation - Plan: DG Chest 2 View  DIABETES MELLITUS, TYPE I  ALLERGIC RHINITIS   1.  Asthma Exacerbation: persistent; peak flows slightly improved from last week.  Refill of Prednisone provided.  Rx for Omnicef also provided.  Start Mucinex bid; rx for [employer] also provided.  Continue Albuterol qid scheduled for one week. 2.  Malaise and fatigue:  New.  Secondary to acute illness and lack of sleep due to cough; start Mucinex qhs and Tessalon Perles qhs.  3.  DMI: stable; continue to monitor sugars closely on Prednisone. 4.  Allergic Rhinitis: uncontrolled; continue Flonase; rx for Prednisone.  Etiology of asthma exacerbation.  Meds ordered this encounter  Medications  . predniSONE (DELTASONE) 20 MG tablet    Sig: Two tablets daily x 5 days then one tablet daily x 3 days    Dispense:  13 tablet    Refill:  0  . benzonatate (TESSALON) 100 MG capsule    Sig: Take 1-2 capsules (100-200 mg total) by mouth 3 (three) times daily as needed for cough.    Dispense:  40 capsule    Refill:  0  . cefdinir (OMNICEF) 300 MG capsule    Sig: Take 2 capsules (600 mg total) by mouth daily.    Dispense:  20 capsule    Refill:   0    No Follow-up on file.  I personally performed the services described in this documentation, which was scribed in my presence.  The recorded information has been reviewed and is accurate.  Reginia Forts, M.D.  [facility] [address] [place], [place]  [address] [phone] phone [phone] fax

## 2013-05-17 NOTE — Patient Instructions (Signed)
1.  Start Mucinex twice daily. 2.  Continue nebulizer three times daily.

## 2013-07-27 ENCOUNTER — Ambulatory Visit (INDEPENDENT_AMBULATORY_CARE_PROVIDER_SITE_OTHER): Payer: Managed Care, Other (non HMO) | Admitting: Internal Medicine

## 2013-07-27 ENCOUNTER — Ambulatory Visit (INDEPENDENT_AMBULATORY_CARE_PROVIDER_SITE_OTHER): Payer: Managed Care, Other (non HMO)

## 2013-07-27 VITALS — BP 124/78 | HR 94 | Temp 98.6°F | Resp 16 | Ht 64.25 in | Wt 219.0 lb

## 2013-07-27 DIAGNOSIS — R21 Rash and other nonspecific skin eruption: Secondary | ICD-10-CM

## 2013-07-27 DIAGNOSIS — M79609 Pain in unspecified limb: Secondary | ICD-10-CM

## 2013-07-27 DIAGNOSIS — M79675 Pain in left toe(s): Secondary | ICD-10-CM

## 2013-07-27 DIAGNOSIS — L02619 Cutaneous abscess of unspecified foot: Secondary | ICD-10-CM

## 2013-07-27 DIAGNOSIS — L03116 Cellulitis of left lower limb: Secondary | ICD-10-CM

## 2013-07-27 DIAGNOSIS — B353 Tinea pedis: Secondary | ICD-10-CM

## 2013-07-27 DIAGNOSIS — L03119 Cellulitis of unspecified part of limb: Secondary | ICD-10-CM

## 2013-07-27 LAB — POCT CBC
Granulocyte percent: 65.8 %G (ref 37–80)
HCT, POC: 45.6 % (ref 37.7–47.9)
Hemoglobin: 15.3 g/dL (ref 12.2–16.2)
Lymph, poc: 1.6 (ref 0.6–3.4)
MCH, POC: 31.2 pg (ref 27–31.2)
MCHC: 33.6 g/dL (ref 31.8–35.4)
MCV: 92.8 fL (ref 80–97)
MID (cbc): 0.6 (ref 0–0.9)
MPV: 9.8 fL (ref 0–99.8)
POC Granulocyte: 4.2 (ref 2–6.9)
POC LYMPH PERCENT: 25.4 %L (ref 10–50)
POC MID %: 8.8 %M (ref 0–12)
Platelet Count, POC: 282 10*3/uL (ref 142–424)
RBC: 4.91 M/uL (ref 4.04–5.48)
RDW, POC: 13.5 %
WBC: 6.4 10*3/uL (ref 4.6–10.2)

## 2013-07-27 LAB — POCT SKIN KOH: Skin KOH, POC: NEGATIVE

## 2013-07-27 MED ORDER — TERBINAFINE HCL 250 MG PO TABS: 250.0000 mg | ORAL_TABLET | Freq: Every day | ORAL | Status: DC

## 2013-07-27 MED ORDER — CEPHALEXIN 500 MG PO CAPS: 500.0000 mg | ORAL_CAPSULE | Freq: Three times a day (TID) | ORAL | Status: DC

## 2013-07-27 NOTE — Progress Notes (Signed)
   Subjective:    Patient ID: Karla Rodriguez, female    DOB: Jul 01, 1965, 48 y.o.   MRN: [id number]  HPI 48 year old female presents for evaluation of 3 day history of worsening foot swelling and rash.  Admits to tenderness in the 4th toe.  Has noticed there her toes are erythematous and there is a rash that is spreading outward from toes.  Admits she has noticed some warmth but denies drainage, fever, chills, nausea, or vomiting.  No pruritis or blistering.  Hx of onychomycosis and intermittent "bouts of athlete's foot."   Has had 2 surgeries of the left knee so does have "issues" with swelling in that foot.  Denies any pain in her calf or popliteal fossa.  She has taken ibuprofen and has been trying to limit her salt intake and push fluids. Elevating the leg does help No hx of clots or recent travel.      Review of Systems  Constitutional: Negative for fever and chills.  Gastrointestinal: Negative for nausea and vomiting.  Musculoskeletal: Positive for joint swelling.  Skin: Positive for color change and rash. Negative for wound.       Objective:   Physical Exam  Constitutional: She is oriented to person, place, and time. She appears well-developed and well-nourished.  HENT:  Head: Normocephalic and atraumatic.  Right Ear: External ear normal.  Left Ear: External ear normal.  Eyes: Conjunctivae are normal.  Neck: Normal range of motion.  Cardiovascular: Normal rate.   Pulses:      Dorsalis pedis pulses are 2+ on the right side, and 2+ on the left side.  Pulmonary/Chest: Effort normal.  Musculoskeletal:  +TTP of 4th digit. Full ROM.  Capillary refill normal <2 seconds  Neurological: She is alert and oriented to person, place, and time.  Skin:     Psychiatric: She has a normal mood and affect. Her behavior is normal. Judgment and thought content normal.     [facility] reading (PRIMARY) by  Dr. Laney Pastor as no acute bony abnormality.       Assessment & Plan:  Pain of toe of left  foot - Plan: POCT CBC, DG Toe 4th Left, cephALEXin (KEFLEX) 500 MG capsule  Rash and nonspecific skin eruption - Plan: POCT Skin KOH, POCT CBC  Cellulitis of foot, left  Tinea pedis of left foot - Plan: terbinafine (LAMISIL) 250 MG tablet  Will treat as tinea pedis with secondary cellulitis.  Patient has kidney/liver function tested q59months at her endocrinologist, so will hold on CMET today. Start Lamisil 250 mg daily x 4 weeks. Keflex 500 mg tid x 10 days RTC precautions discussed. Recheck if symptoms worsening or fail to improve.

## 2013-07-29 ENCOUNTER — Ambulatory Visit (INDEPENDENT_AMBULATORY_CARE_PROVIDER_SITE_OTHER): Payer: Managed Care, Other (non HMO) | Admitting: Family Medicine

## 2013-07-29 VITALS — BP 126/70 | HR 106 | Temp 98.4°F | Resp 20 | Ht 64.25 in | Wt 219.0 lb

## 2013-07-29 DIAGNOSIS — B353 Tinea pedis: Secondary | ICD-10-CM

## 2013-07-29 DIAGNOSIS — L03116 Cellulitis of left lower limb: Secondary | ICD-10-CM

## 2013-07-29 DIAGNOSIS — L02619 Cutaneous abscess of unspecified foot: Secondary | ICD-10-CM

## 2013-07-29 DIAGNOSIS — E109 Type 1 diabetes mellitus without complications: Secondary | ICD-10-CM

## 2013-07-29 DIAGNOSIS — L03119 Cellulitis of unspecified part of limb: Secondary | ICD-10-CM

## 2013-07-29 LAB — POCT CBC
GRANULOCYTE PERCENT: 68 % (ref 37–80)
HEMATOCRIT: 46.1 % (ref 37.7–47.9)
HEMOGLOBIN: 15.1 g/dL (ref 12.2–16.2)
LYMPH, POC: 1.5 (ref 0.6–3.4)
MCH, POC: 30.4 pg (ref 27–31.2)
MCHC: 32.8 g/dL (ref 31.8–35.4)
MCV: 92.8 fL (ref 80–97)
MID (cbc): 0.5 (ref 0–0.9)
MPV: 10.3 fL (ref 0–99.8)
POC GRANULOCYTE: 4.2 (ref 2–6.9)
POC LYMPH PERCENT: 23.9 %L (ref 10–50)
POC MID %: 8.1 % (ref 0–12)
Platelet Count, POC: 299 10*3/uL (ref 142–424)
RBC: 4.97 M/uL (ref 4.04–5.48)
RDW, POC: 13.1 %
WBC: 6.2 10*3/uL (ref 4.6–10.2)

## 2013-07-29 MED ORDER — DOXYCYCLINE HYCLATE 100 MG PO CAPS: 100.0000 mg | ORAL_CAPSULE | Freq: Two times a day (BID) | ORAL | Status: DC

## 2013-07-29 MED ORDER — ONDANSETRON 8 MG PO TBDP: 8.0000 mg | ORAL_TABLET | Freq: Three times a day (TID) | ORAL | Status: DC | PRN

## 2013-07-29 MED ORDER — KETOCONAZOLE 2 % EX CREA: 1.0000 "application " | TOPICAL_CREAM | Freq: Two times a day (BID) | CUTANEOUS | Status: DC
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
## 2013-07-29 NOTE — Patient Instructions (Signed)
1. Elevate foot as much as possible.  2.  Return for fever > 100.5, increasing pain in L foot, increased swelling in L foot, increased redness in L foot.   3. I am in clinic at 104 on Monday and Wednesday 8-5.

## 2013-07-30 ENCOUNTER — Ambulatory Visit (INDEPENDENT_AMBULATORY_CARE_PROVIDER_SITE_OTHER): Payer: Managed Care, Other (non HMO) | Admitting: Family Medicine

## 2013-07-30 ENCOUNTER — Encounter: Payer: Self-pay | Admitting: Family Medicine

## 2013-07-30 VITALS — BP 121/74 | HR 101 | Temp 98.1°F | Resp 18 | Ht 64.25 in | Wt 220.2 lb

## 2013-07-30 DIAGNOSIS — B353 Tinea pedis: Secondary | ICD-10-CM

## 2013-07-30 DIAGNOSIS — L03119 Cellulitis of unspecified part of limb: Secondary | ICD-10-CM

## 2013-07-30 DIAGNOSIS — L03116 Cellulitis of left lower limb: Secondary | ICD-10-CM

## 2013-07-30 DIAGNOSIS — L02619 Cutaneous abscess of unspecified foot: Secondary | ICD-10-CM

## 2013-07-30 DIAGNOSIS — E109 Type 1 diabetes mellitus without complications: Secondary | ICD-10-CM

## 2013-07-30 LAB — CBC WITH DIFFERENTIAL/PLATELET
BASOS ABS: 0 10*3/uL (ref 0.0–0.1)
Basophils Relative: 0 % (ref 0–1)
EOS PCT: 2 % (ref 0–5)
Eosinophils Absolute: 0.1 10*3/uL (ref 0.0–0.7)
HEMATOCRIT: 43.6 % (ref 36.0–46.0)
HEMOGLOBIN: 15.1 g/dL — AB (ref 12.0–15.0)
LYMPHS ABS: 1.8 10*3/uL (ref 0.7–4.0)
LYMPHS PCT: 24 % (ref 12–46)
MCH: 30.8 pg (ref 26.0–34.0)
MCHC: 34.6 g/dL (ref 30.0–36.0)
MCV: 89 fL (ref 78.0–100.0)
MONO ABS: 0.9 10*3/uL (ref 0.1–1.0)
MONOS PCT: 12 % (ref 3–12)
NEUTROS ABS: 4.5 10*3/uL (ref 1.7–7.7)
Neutrophils Relative %: 62 % (ref 43–77)
Platelets: 314 10*3/uL (ref 150–400)
RBC: 4.9 MIL/uL (ref 3.87–5.11)
RDW: 13.3 % (ref 11.5–15.5)
WBC: 7.3 10*3/uL (ref 4.0–10.5)

## 2013-07-30 MED ORDER — CEFTRIAXONE SODIUM 1 G IJ SOLR
500.0000 mg | Freq: Once | INTRAMUSCULAR | Status: AC
  Administered 2013-07-30: 500 mg via INTRAMUSCULAR
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
## 2013-07-31 ENCOUNTER — Encounter: Payer: Self-pay | Admitting: Family Medicine

## 2013-07-31 IMAGING — CR DG CHEST 2V
2 series · 2 of 2 positions shown · non-contrast
Comparison: PA and lateral chest 11/11/2011.

***ADDENDUM*** CREATED: 03/13/2012 [DATE]

This addendum is given for the purpose of correcting the first
sentence in the findings in the initially dictated report.  That
sentence should read:  There is focal airspace disease in the left
lower lobe best seen on the PA view.
***END ADDENDUM*** SIGNED BY: Curudali Kruise, M.D.
CLINICAL DATA: Cough.  Left chest pain.
CHEST - 2 VIEW

[PA]
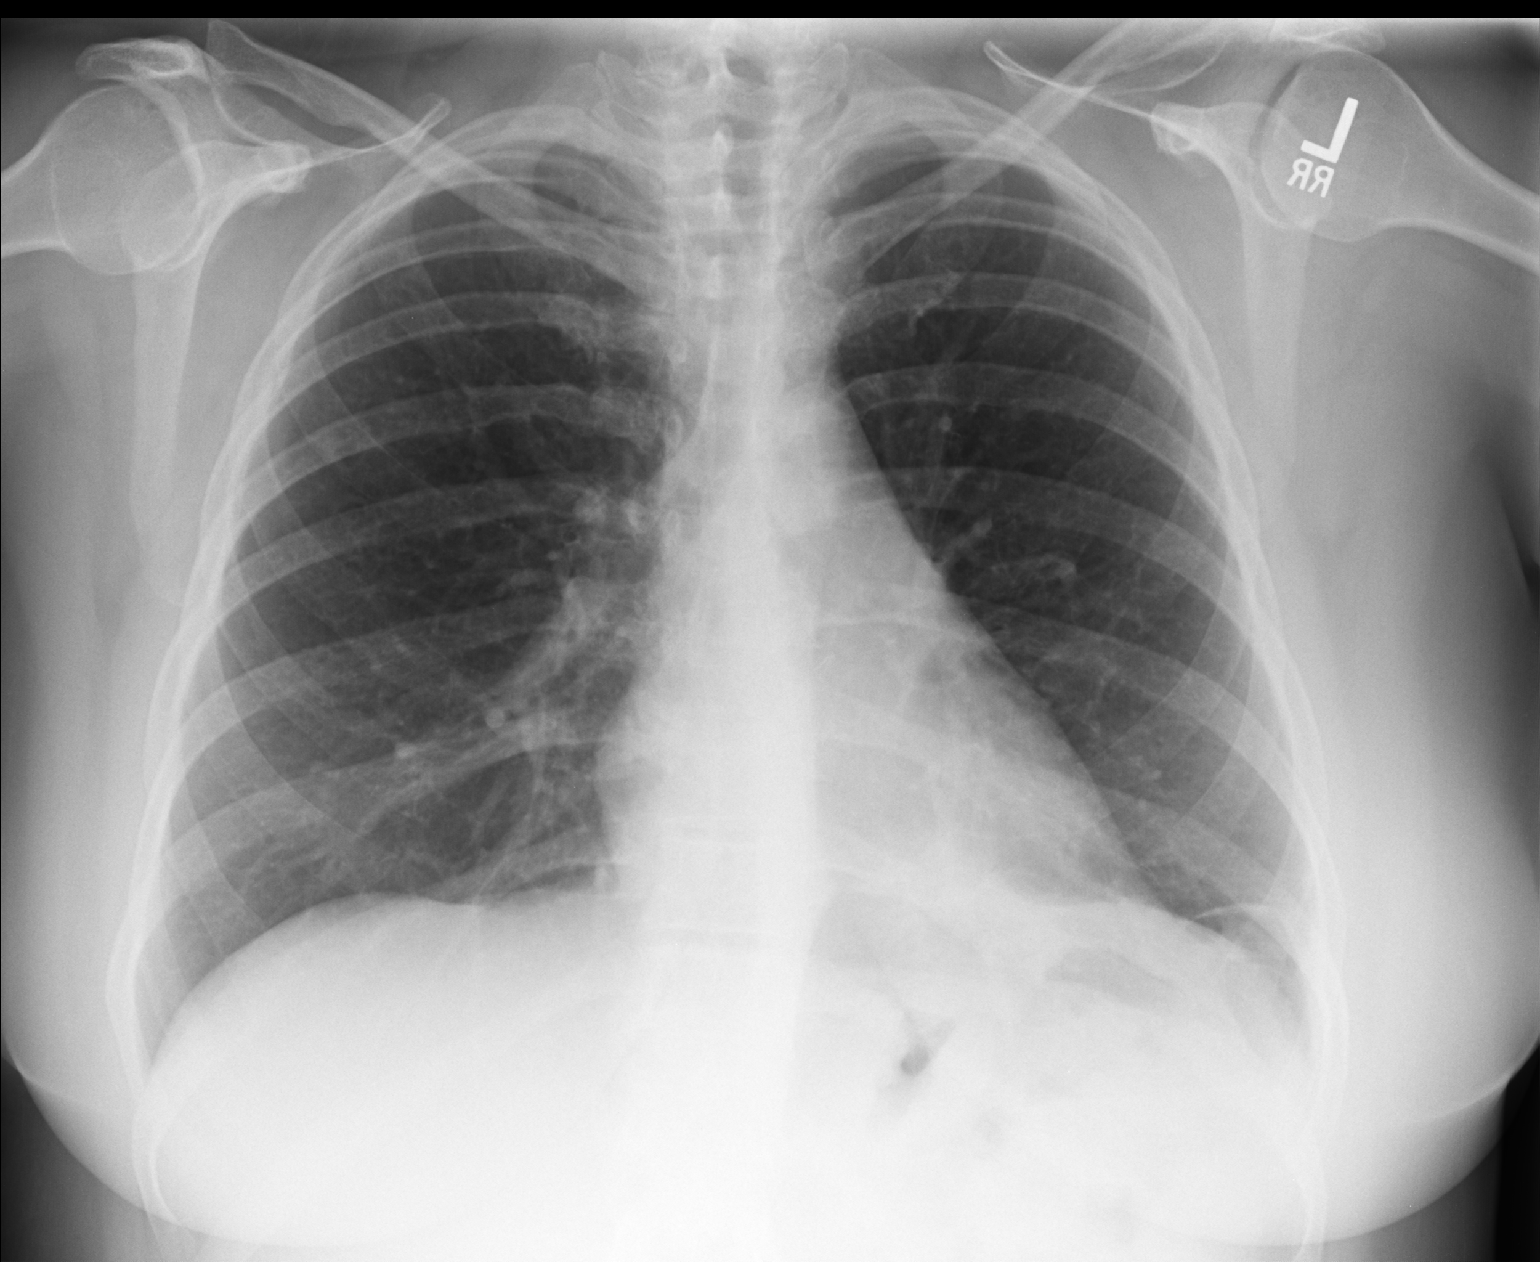

[lateral]
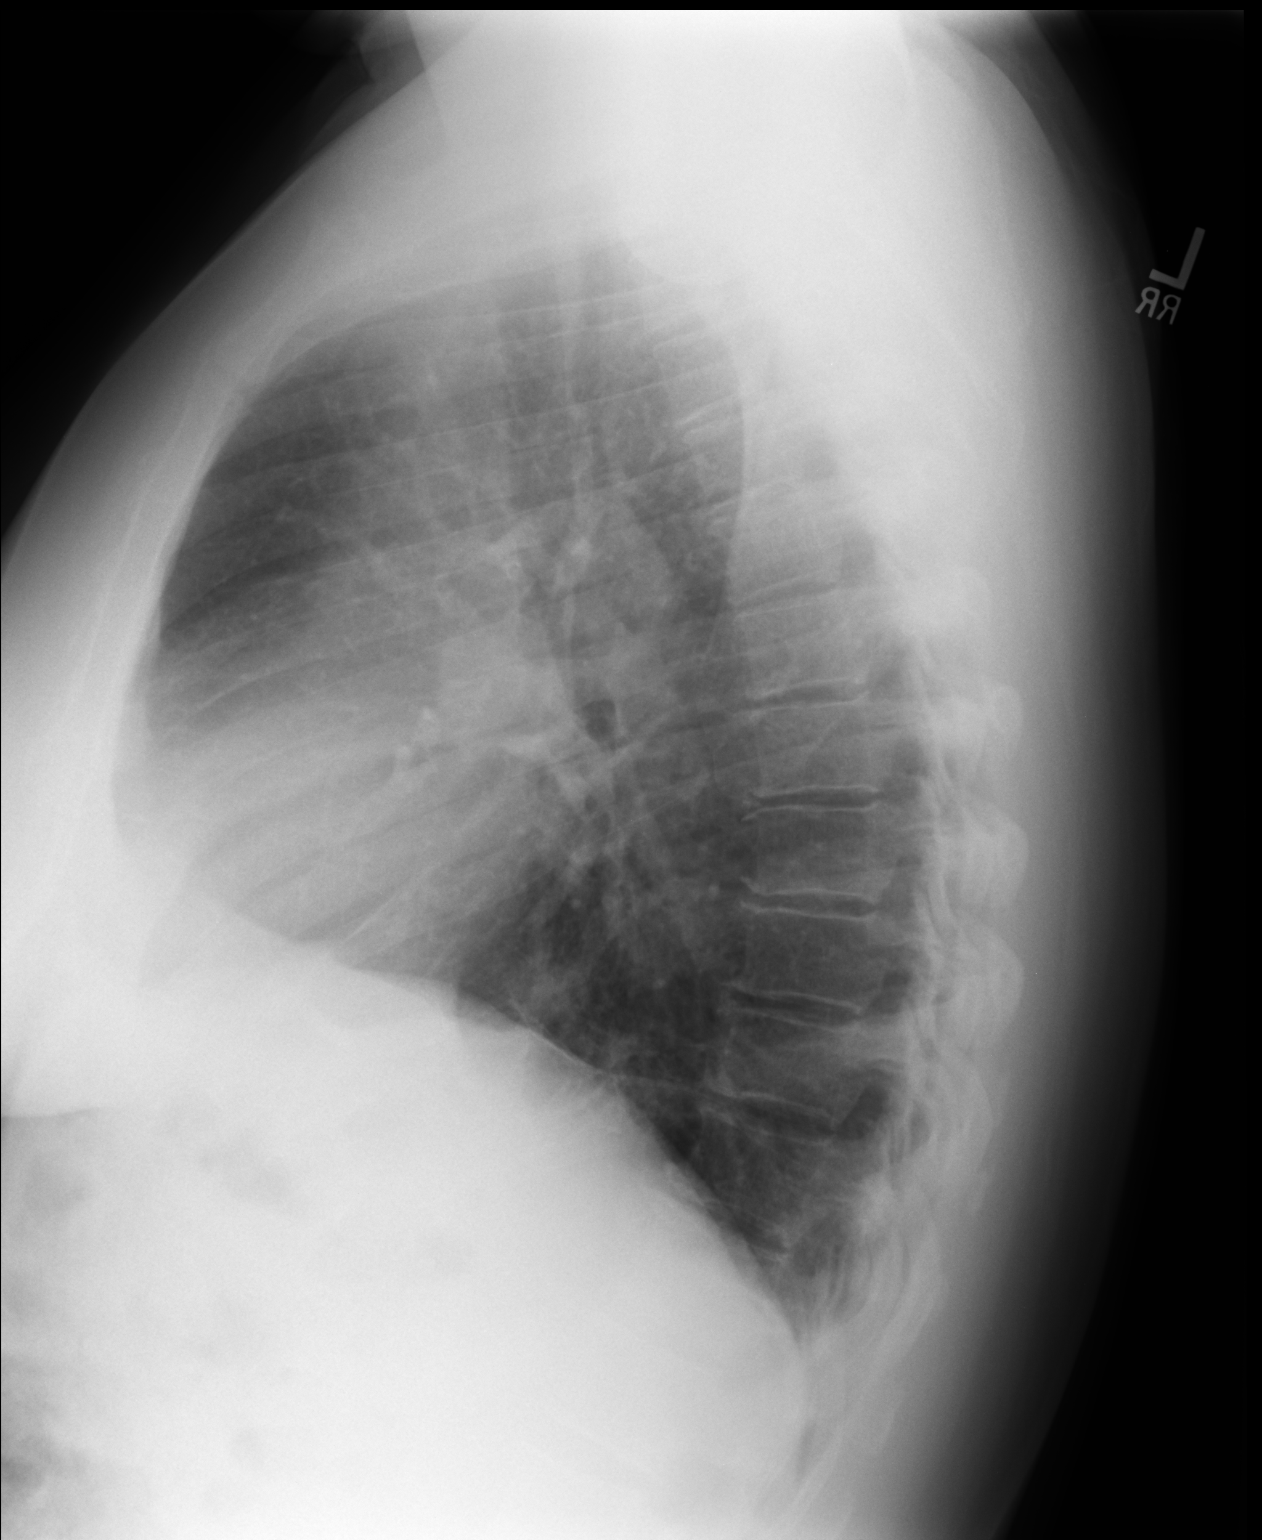

[2 of 2 positions shown; findings below may reference images not displayed]

FINDINGS: There is no focal airspace disease in the left lower lobe
best seen on the PA view.  The right lung is clear.  No
pneumothorax or pleural fluid.
IMPRESSION: Left lower lobe airspace disease worrisome for pneumonia.

## 2013-08-01 ENCOUNTER — Encounter: Payer: Self-pay | Admitting: Family Medicine

## 2013-08-01 ENCOUNTER — Ambulatory Visit (INDEPENDENT_AMBULATORY_CARE_PROVIDER_SITE_OTHER): Payer: Managed Care, Other (non HMO) | Admitting: Family Medicine

## 2013-08-01 VITALS — BP 120/80 | HR 106 | Temp 98.2°F | Resp 16 | Ht 64.0 in | Wt 220.0 lb

## 2013-08-01 DIAGNOSIS — E109 Type 1 diabetes mellitus without complications: Secondary | ICD-10-CM

## 2013-08-01 DIAGNOSIS — L02619 Cutaneous abscess of unspecified foot: Secondary | ICD-10-CM

## 2013-08-01 DIAGNOSIS — L03119 Cellulitis of unspecified part of limb: Secondary | ICD-10-CM

## 2013-08-01 DIAGNOSIS — L03116 Cellulitis of left lower limb: Secondary | ICD-10-CM
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
## 2013-09-01 ENCOUNTER — Other Ambulatory Visit: Payer: Self-pay | Admitting: Internal Medicine

## 2013-09-10 ENCOUNTER — Encounter: Payer: Managed Care, Other (non HMO) | Admitting: Family Medicine

## 2013-09-18 ENCOUNTER — Other Ambulatory Visit: Payer: Self-pay | Admitting: Physician Assistant

## 2013-09-19 NOTE — Telephone Encounter (Signed)
Heather, do you want to RF?

## 2013-09-26 ENCOUNTER — Other Ambulatory Visit: Payer: Self-pay | Admitting: Family Medicine

## 2013-10-05 ENCOUNTER — Telehealth: Payer: Self-pay

## 2013-10-05 NOTE — Telephone Encounter (Signed)
Pt of Dr. Tamala Julian would like a refill on terbinafine (LAMISIL) 250 MG tablet [[id number]] to the [facility] on [address]. She said that her pharmacy has sent in multiple request within the past two weeks, and have not head back from [place]. I did not see anything in the system.

## 2013-10-09 NOTE — Telephone Encounter (Signed)
Medication was refused by [place] due to needing an OV.  Spoke to pt- transferred to make an appt.

## 2013-10-22 ENCOUNTER — Ambulatory Visit (INDEPENDENT_AMBULATORY_CARE_PROVIDER_SITE_OTHER): Payer: Managed Care, Other (non HMO) | Admitting: Family Medicine

## 2013-10-22 ENCOUNTER — Encounter: Payer: Self-pay | Admitting: Family Medicine

## 2013-10-22 VITALS — BP 110/66 | HR 99 | Temp 98.6°F | Resp 16 | Ht 64.5 in | Wt 221.0 lb

## 2013-10-22 DIAGNOSIS — Z23 Encounter for immunization: Secondary | ICD-10-CM

## 2013-10-22 DIAGNOSIS — L02619 Cutaneous abscess of unspecified foot: Secondary | ICD-10-CM

## 2013-10-22 DIAGNOSIS — E109 Type 1 diabetes mellitus without complications: Secondary | ICD-10-CM

## 2013-10-22 DIAGNOSIS — R42 Dizziness and giddiness: Secondary | ICD-10-CM

## 2013-10-22 DIAGNOSIS — L03119 Cellulitis of unspecified part of limb: Secondary | ICD-10-CM

## 2013-10-22 DIAGNOSIS — B351 Tinea unguium: Secondary | ICD-10-CM

## 2013-10-22 DIAGNOSIS — L03116 Cellulitis of left lower limb: Secondary | ICD-10-CM

## 2013-10-22 DIAGNOSIS — B353 Tinea pedis: Secondary | ICD-10-CM

## 2013-10-22 LAB — CBC WITH DIFFERENTIAL/PLATELET
BASOS ABS: 0 10*3/uL (ref 0.0–0.1)
BASOS PCT: 0 % (ref 0–1)
EOS ABS: 0.2 10*3/uL (ref 0.0–0.7)
Eosinophils Relative: 2 % (ref 0–5)
HCT: 42.2 % (ref 36.0–46.0)
Hemoglobin: 14.8 g/dL (ref 12.0–15.0)
LYMPHS ABS: 1.7 10*3/uL (ref 0.7–4.0)
Lymphocytes Relative: 19 % (ref 12–46)
MCH: 30.6 pg (ref 26.0–34.0)
MCHC: 35.1 g/dL (ref 30.0–36.0)
MCV: 87.2 fL (ref 78.0–100.0)
Monocytes Absolute: 0.8 10*3/uL (ref 0.1–1.0)
Monocytes Relative: 9 % (ref 3–12)
NEUTROS PCT: 70 % (ref 43–77)
Neutro Abs: 6.2 10*3/uL (ref 1.7–7.7)
Platelets: 288 10*3/uL (ref 150–400)
RBC: 4.84 MIL/uL (ref 3.87–5.11)
RDW: 13.9 % (ref 11.5–15.5)
WBC: 8.8 10*3/uL (ref 4.0–10.5)

## 2013-10-22 MED ORDER — TERBINAFINE HCL 250 MG PO TABS: 250.0000 mg | ORAL_TABLET | Freq: Every day | ORAL | Status: DC

## 2013-10-22 NOTE — Patient Instructions (Signed)
1.  HOLD CRESTOR WHILE TAKING LAMISIL.
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
## 2013-10-23 LAB — COMPREHENSIVE METABOLIC PANEL
ALT: 9 U/L (ref 0–35)
AST: 13 U/L (ref 0–37)
Albumin: 4.6 g/dL (ref 3.5–5.2)
Alkaline Phosphatase: 71 U/L (ref 39–117)
BUN: 9 mg/dL (ref 6–23)
CO2: 25 meq/L (ref 19–32)
Calcium: 9.5 mg/dL (ref 8.4–10.5)
Chloride: 105 mEq/L (ref 96–112)
Creat: 0.79 mg/dL (ref 0.50–1.10)
GLUCOSE: 105 mg/dL — AB (ref 70–99)
Potassium: 4.5 mEq/L (ref 3.5–5.3)
SODIUM: 139 meq/L (ref 135–145)
TOTAL PROTEIN: 6.4 g/dL (ref 6.0–8.3)
Total Bilirubin: 0.4 mg/dL (ref 0.2–1.2)

## 2013-11-12 ENCOUNTER — Telehealth: Payer: Self-pay | Admitting: *Deleted

## 2013-11-12 NOTE — Telephone Encounter (Signed)
Called patient left message to make an appointment with Dr Tamala Julian to follow up diabetes.

## 2013-11-14 ENCOUNTER — Other Ambulatory Visit: Payer: Self-pay | Admitting: Internal Medicine

## 2013-11-14 NOTE — Telephone Encounter (Signed)
Dr Tamala Julian, you just saw pt in Sept but don't see this med discussed. Can we RF?

## 2013-11-15 NOTE — Telephone Encounter (Signed)
Refill appropriate.  Rx approved.

## 2014-01-28 ENCOUNTER — Other Ambulatory Visit: Payer: Self-pay | Admitting: Family Medicine

## 2014-02-21 ENCOUNTER — Ambulatory Visit (INDEPENDENT_AMBULATORY_CARE_PROVIDER_SITE_OTHER): Payer: Managed Care, Other (non HMO) | Admitting: Family Medicine

## 2014-02-21 ENCOUNTER — Ambulatory Visit (INDEPENDENT_AMBULATORY_CARE_PROVIDER_SITE_OTHER): Payer: Managed Care, Other (non HMO)

## 2014-02-21 VITALS — BP 120/80 | HR 96 | Temp 98.8°F | Resp 16 | Ht 65.5 in | Wt 225.0 lb

## 2014-02-21 DIAGNOSIS — K5901 Slow transit constipation: Secondary | ICD-10-CM

## 2014-02-21 DIAGNOSIS — S300XXA Contusion of lower back and pelvis, initial encounter: Secondary | ICD-10-CM

## 2014-02-21 DIAGNOSIS — E109 Type 1 diabetes mellitus without complications: Secondary | ICD-10-CM

## 2014-02-21 NOTE — Patient Instructions (Signed)
MIRALAX PURGE:  1.  Take 4 Dulcolax tablets at once. 2.  Wait one hour 3.  Take 8 doses of Miralax (each dose in 8 ounces of water) over 3 hours. 4.  Results usually within 6 hours. 5. If no results in 24 hours, repeat.   Constipation Constipation is when a person has fewer than three bowel movements a week, has difficulty having a bowel movement, or has stools that are dry, hard, or larger than normal. As people grow older, constipation is more common. If you try to fix constipation with medicines that make you have a bowel movement (laxatives), the problem may get worse. Long-term laxative use may cause the muscles of the colon to become weak. A low-fiber diet, not taking in enough fluids, and taking certain medicines may make constipation worse.  CAUSES   Certain medicines, such as antidepressants, pain medicine, iron supplements, antacids, and water pills.   Certain diseases, such as diabetes, irritable bowel syndrome (IBS), thyroid disease, or depression.   Not drinking enough water.   Not eating enough fiber-rich foods.   Stress or travel.   Lack of physical activity or exercise.   Ignoring the urge to have a bowel movement.   Using laxatives too much.  SIGNS AND SYMPTOMS   Having fewer than three bowel movements a week.   Straining to have a bowel movement.   Having stools that are hard, dry, or larger than normal.   Feeling full or bloated.   Pain in the lower abdomen.   Not feeling relief after having a bowel movement.  DIAGNOSIS  Your health care provider will take a medical history and perform a physical exam. Further testing may be done for severe constipation. Some tests may include:  A barium enema X-ray to examine your rectum, colon, and, sometimes, your small intestine.   A sigmoidoscopy to examine your lower colon.   A colonoscopy to examine your entire colon. TREATMENT  Treatment will depend on the severity of your constipation and  what is causing it. Some dietary treatments include drinking more fluids and eating more fiber-rich foods. Lifestyle treatments may include regular exercise. If these diet and lifestyle recommendations do not help, your health care provider may recommend taking over-the-counter laxative medicines to help you have bowel movements. Prescription medicines may be prescribed if over-the-counter medicines do not work.  HOME CARE INSTRUCTIONS   Eat foods that have a lot of fiber, such as fruits, vegetables, whole grains, and beans.  Limit foods high in fat and processed sugars, such as french fries, hamburgers, cookies, candies, and soda.   A fiber supplement may be added to your diet if you cannot get enough fiber from foods.   Drink enough fluids to keep your urine clear or pale yellow.   Exercise regularly or as directed by your health care provider.   Go to the restroom when you have the urge to go. Do not hold it.   Only take over-the-counter or prescription medicines as directed by your health care provider. Do not take other medicines for constipation without talking to your health care provider first.  [place] IF:   You have bright red blood in your stool.   Your constipation lasts for more than 4 days or gets worse.   You have abdominal or rectal pain.   You have thin, pencil-like stools.   You have unexplained weight loss. MAKE SURE YOU:   Understand these instructions.  Will watch your condition.  Will  get help right away if you are not doing well or get worse. Document Released: 10/24/2003 Document Revised: 01/30/2013 Document Reviewed: 11/06/2012 [facility] Patient Information 2015 [place], [place]. This information is not intended to replace advice given to you by your health care provider. Make sure you discuss any questions you have with your health care provider.
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
## 2014-03-11 ENCOUNTER — Other Ambulatory Visit: Payer: Self-pay

## 2014-03-11 NOTE — Telephone Encounter (Signed)
[facility] sent request for RF of lisinopril. Dr Tamala Julian, pt has appt sch w/you for 03/25/14. Do you want to approve 90 day RF since it's mail order?

## 2014-03-12 MED ORDER — LISINOPRIL 10 MG PO TABS: ORAL_TABLET | ORAL | Status: DC

## 2014-03-25 ENCOUNTER — Ambulatory Visit: Payer: Managed Care, Other (non HMO) | Admitting: Family Medicine

## 2014-05-23 ENCOUNTER — Ambulatory Visit (INDEPENDENT_AMBULATORY_CARE_PROVIDER_SITE_OTHER): Payer: Managed Care, Other (non HMO) | Admitting: Family Medicine

## 2014-05-23 VITALS — BP 132/84 | HR 87 | Temp 98.2°F | Resp 18 | Ht 65.5 in | Wt 220.4 lb

## 2014-05-23 DIAGNOSIS — R5383 Other fatigue: Secondary | ICD-10-CM | POA: Diagnosis not present

## 2014-05-23 DIAGNOSIS — F3176 Bipolar disorder, in full remission, most recent episode depressed: Secondary | ICD-10-CM

## 2014-05-23 DIAGNOSIS — G4733 Obstructive sleep apnea (adult) (pediatric): Secondary | ICD-10-CM

## 2014-05-23 DIAGNOSIS — E109 Type 1 diabetes mellitus without complications: Secondary | ICD-10-CM

## 2014-05-23 DIAGNOSIS — M791 Myalgia, unspecified site: Secondary | ICD-10-CM

## 2014-05-23 LAB — POCT UA - MICROSCOPIC ONLY
CASTS, UR, LPF, POC: NEGATIVE
Crystals, Ur, HPF, POC: NEGATIVE
MUCUS UA: NEGATIVE
YEAST UA: NEGATIVE

## 2014-05-23 LAB — POCT CBC
Granulocyte percent: 75.1 %G (ref 37–80)
HEMATOCRIT: 44.9 % (ref 37.7–47.9)
HEMOGLOBIN: 15 g/dL (ref 12.2–16.2)
LYMPH, POC: 1.6 (ref 0.6–3.4)
MCH: 30 pg (ref 27–31.2)
MCHC: 33.4 g/dL (ref 31.8–35.4)
MCV: 89.7 fL (ref 80–97)
MID (cbc): 0.8 (ref 0–0.9)
MPV: 8.7 fL (ref 0–99.8)
POC Granulocyte: 7.3 — AB (ref 2–6.9)
POC LYMPH PERCENT: 16.7 %L (ref 10–50)
POC MID %: 8.2 %M (ref 0–12)
Platelet Count, POC: 252 10*3/uL (ref 142–424)
RBC: 5.01 M/uL (ref 4.04–5.48)
RDW, POC: 13.6 %
WBC: 9.7 10*3/uL (ref 4.6–10.2)

## 2014-05-23 LAB — POCT URINALYSIS DIPSTICK
Bilirubin, UA: NEGATIVE
Glucose, UA: 100
Ketones, UA: NEGATIVE
Leukocytes, UA: NEGATIVE
NITRITE UA: NEGATIVE
PROTEIN UA: NEGATIVE
SPEC GRAV UA: 1.01
Urobilinogen, UA: 0.2
pH, UA: 7
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
## 2014-05-24 LAB — SEDIMENTATION RATE: Sed Rate: 5 mm/hr (ref 0–20)

## 2014-05-24 LAB — VITAMIN D 25 HYDROXY (VIT D DEFICIENCY, FRACTURES): VIT D 25 HYDROXY: 63 ng/mL (ref 30–100)

## 2014-05-24 LAB — COMPREHENSIVE METABOLIC PANEL
ALBUMIN: 4.7 g/dL (ref 3.5–5.2)
ALK PHOS: 77 U/L (ref 39–117)
ALT: 12 U/L (ref 0–35)
AST: 14 U/L (ref 0–37)
BUN: 10 mg/dL (ref 6–23)
CO2: 27 mEq/L (ref 19–32)
Calcium: 9.6 mg/dL (ref 8.4–10.5)
Chloride: 100 mEq/L (ref 96–112)
Creat: 0.73 mg/dL (ref 0.50–1.10)
GLUCOSE: 260 mg/dL — AB (ref 70–99)
POTASSIUM: 4.5 meq/L (ref 3.5–5.3)
SODIUM: 138 meq/L (ref 135–145)
TOTAL PROTEIN: 6.7 g/dL (ref 6.0–8.3)
Total Bilirubin: 0.5 mg/dL (ref 0.2–1.2)

## 2014-05-24 LAB — VITAMIN B12: Vitamin B-12: 684 pg/mL (ref 211–911)

## 2014-05-24 LAB — CK: Total CK: 75 U/L (ref 7–177)

## 2014-05-24 LAB — TSH: TSH: 2.666 u[IU]/mL (ref 0.350–4.500)

## 2014-05-24 LAB — T4, FREE: FREE T4: 0.97 ng/dL (ref 0.80–1.80)

## 2014-05-24 LAB — RHEUMATOID FACTOR

## 2014-05-27 LAB — ANA: ANA: NEGATIVE

## 2014-06-26 ENCOUNTER — Encounter: Payer: Self-pay | Admitting: Family Medicine

## 2014-06-26 ENCOUNTER — Ambulatory Visit (INDEPENDENT_AMBULATORY_CARE_PROVIDER_SITE_OTHER): Payer: Managed Care, Other (non HMO) | Admitting: Family Medicine

## 2014-06-26 VITALS — BP 116/82 | HR 91 | Temp 98.5°F | Resp 16 | Ht 64.5 in | Wt 212.2 lb

## 2014-06-26 DIAGNOSIS — J4531 Mild persistent asthma with (acute) exacerbation: Secondary | ICD-10-CM | POA: Diagnosis not present

## 2014-06-26 DIAGNOSIS — L600 Ingrowing nail: Secondary | ICD-10-CM | POA: Diagnosis not present

## 2014-06-26 DIAGNOSIS — E7800 Pure hypercholesterolemia, unspecified: Secondary | ICD-10-CM

## 2014-06-26 DIAGNOSIS — Z6835 Body mass index (BMI) 35.0-35.9, adult: Secondary | ICD-10-CM | POA: Diagnosis not present

## 2014-06-26 DIAGNOSIS — I10 Essential (primary) hypertension: Secondary | ICD-10-CM

## 2014-06-26 DIAGNOSIS — M2550 Pain in unspecified joint: Secondary | ICD-10-CM | POA: Diagnosis not present

## 2014-06-26 DIAGNOSIS — E669 Obesity, unspecified: Secondary | ICD-10-CM

## 2014-06-26 DIAGNOSIS — E109 Type 1 diabetes mellitus without complications: Secondary | ICD-10-CM

## 2014-06-26 DIAGNOSIS — S86811A Strain of other muscle(s) and tendon(s) at lower leg level, right leg, initial encounter: Secondary | ICD-10-CM | POA: Diagnosis not present

## 2014-06-26 MED ORDER — ALBUTEROL SULFATE HFA 108 (90 BASE) MCG/ACT IN AERS: 2.0000 | INHALATION_SPRAY | Freq: Four times a day (QID) | RESPIRATORY_TRACT | Status: DC | PRN
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
## 2014-06-26 NOTE — Patient Instructions (Signed)
1. Soak toenail once to twice daily in warm water. 2.  Perform stretches daily on calves.  Achilles Tendinitis  with Rehab Achilles tendinitis is a disorder of the Achilles tendon. The Achilles tendon connects the large calf muscles (Gastrocnemius and Soleus) to the heel bone (calcaneus). This tendon is sometimes called the heel cord. It is important for pushing-off and standing on your toes and is important for walking, running, or jumping. Tendinitis is often caused by overuse and repetitive microtrauma. SYMPTOMS  Pain, tenderness, swelling, warmth, and redness may occur over the Achilles tendon even at rest.  Pain with pushing off, or flexing or extending the ankle.  Pain that is worsened after or during activity. CAUSES   Overuse sometimes seen with rapid increase in exercise programs or in sports requiring running and jumping.  Poor physical conditioning (strength and flexibility or endurance).  Running sports, especially training running down hills.  Inadequate warm-up before practice or play or failure to stretch before participation.  Injury to the tendon. PREVENTION   Warm up and stretch before practice or competition.  Allow time for adequate rest and recovery between practices and competition.  Keep up conditioning.  Keep up ankle and leg flexibility.  Improve or keep muscle strength and endurance.  Improve cardiovascular fitness.  Use proper technique.  Use proper equipment (shoes, skates).  To help prevent recurrence, taping, protective strapping, or an adhesive bandage may be recommended for several weeks after healing is complete. PROGNOSIS   Recovery may take weeks to several months to heal.  Longer recovery is expected if symptoms have been prolonged.  Recovery is usually quicker if the inflammation is due to a direct blow as compared with overuse or sudden strain. RELATED COMPLICATIONS   Healing time will be prolonged if the condition is not  correctly treated. The injury must be given plenty of time to heal.  Symptoms can reoccur if activity is resumed too soon.  Untreated, tendinitis may increase the risk of tendon rupture requiring additional time for recovery and possibly surgery. TREATMENT   The first treatment consists of rest anti-inflammatory medication, and ice to relieve the pain.  Stretching and strengthening exercises after resolution of pain will likely help reduce the risk of recurrence. Referral to a physical therapist or athletic trainer for further evaluation and treatment may be helpful.  A walking boot or cast may be recommended to rest the Achilles tendon. This can help break the cycle of inflammation and microtrauma.  Arch supports (orthotics) may be prescribed or recommended by your caregiver as an adjunct to therapy and rest.  Surgery to remove the inflamed tendon lining or degenerated tendon tissue is rarely necessary and has shown less than predictable results. MEDICATION   Nonsteroidal anti-inflammatory medications, such as aspirin and ibuprofen, may be used for pain and inflammation relief. Do not take within 7 days before surgery. Take these as directed by your caregiver. Contact your caregiver immediately if any bleeding, stomach upset, or signs of allergic reaction occur. Other minor pain relievers, such as acetaminophen, may also be used.  Pain relievers may be prescribed as necessary by your caregiver. Do not take prescription pain medication for longer than 4 to 7 days. Use only as directed and only as much as you need.  Cortisone injections are rarely indicated. Cortisone injections may weaken tendons and predispose to rupture. It is better to give the condition more time to heal than to use them. HEAT AND COLD  Cold is used to relieve pain  and reduce inflammation for acute and chronic Achilles tendinitis. Cold should be applied for 10 to 15 minutes every 2 to 3 hours for inflammation and pain  and immediately after any activity that aggravates your symptoms. Use ice packs or an ice massage.  Heat may be used before performing stretching and strengthening activities prescribed by your caregiver. Use a heat pack or a warm soak. SEEK MEDICAL CARE IF:  Symptoms get worse or do not improve in 2 weeks despite treatment.  New, unexplained symptoms develop. Drugs used in treatment may produce side effects. EXERCISES RANGE OF MOTION (ROM) AND STRETCHING EXERCISES - Achilles Tendinitis  These exercises may help you when beginning to rehabilitate your injury. Your symptoms may resolve with or without further involvement from your physician, physical therapist or athletic trainer. While completing these exercises, remember:   Restoring tissue flexibility helps normal motion to return to the joints. This allows healthier, less painful movement and activity.  An effective stretch should be held for at least 30 seconds.  A stretch should never be painful. You should only feel a gentle lengthening or release in the stretched tissue. STRETCH - Gastroc, Standing   Place hands on wall.  Extend right / left leg, keeping the front knee somewhat bent.  Slightly point your toes inward on your back foot.  Keeping your right / left heel on the floor and your knee straight, shift your weight toward the wall, not allowing your back to arch.  You should feel a gentle stretch in the right / left calf. Hold this position for __________ seconds. Repeat __________ times. Complete this stretch __________ times per day. STRETCH - Soleus, Standing   Place hands on wall.  Extend right / left leg, keeping the other knee somewhat bent.  Slightly point your toes inward on your back foot.  Keep your right / left heel on the floor, bend your back knee, and slightly shift your weight over the back leg so that you feel a gentle stretch deep in your back calf.  Hold this position for __________  seconds. Repeat __________ times. Complete this stretch __________ times per day. STRETCH - Gastrocsoleus, Standing  Note: This exercise can place a lot of stress on your foot and ankle. Please complete this exercise only if specifically instructed by your caregiver.   Place the ball of your right / left foot on a step, keeping your other foot firmly on the same step.  Hold on to the wall or a rail for balance.  Slowly lift your other foot, allowing your body weight to press your heel down over the edge of the step.  You should feel a stretch in your right / left calf.  Hold this position for __________ seconds.  Repeat this exercise with a slight bend in your knee. Repeat __________ times. Complete this stretch __________ times per day.  STRENGTHENING EXERCISES - Achilles Tendinitis These exercises may help you when beginning to rehabilitate your injury. They may resolve your symptoms with or without further involvement from your physician, physical therapist or athletic trainer. While completing these exercises, remember:   Muscles can gain both the endurance and the strength needed for everyday activities through controlled exercises.  Complete these exercises as instructed by your physician, physical therapist or athletic trainer. Progress the resistance and repetitions only as guided.  You may experience muscle soreness or fatigue, but the pain or discomfort you are trying to eliminate should never worsen during these exercises. If this pain does  worsen, stop and make certain you are following the directions exactly. If the pain is still present after adjustments, discontinue the exercise until you can discuss the trouble with your clinician. STRENGTH - Plantar-flexors   Sit with your right / left leg extended. Holding onto both ends of a rubber exercise band/tubing, loop it around the ball of your foot. Keep a slight tension in the band.  Slowly push your toes away from you,  pointing them downward.  Hold this position for __________ seconds. Return slowly, controlling the tension in the band/tubing. Repeat __________ times. Complete this exercise __________ times per day.  STRENGTH - Plantar-flexors   Stand with your feet shoulder width apart. Steady yourself with a wall or table using as little support as needed.  Keeping your weight evenly spread over the width of your feet, rise up on your toes.*  Hold this position for __________ seconds. Repeat __________ times. Complete this exercise __________ times per day.  *If this is too easy, shift your weight toward your right / left leg until you feel challenged. Ultimately, you may be asked to do this exercise with your right / left foot only. STRENGTH - Plantar-flexors, Eccentric  Note: This exercise can place a lot of stress on your foot and ankle. Please complete this exercise only if specifically instructed by your caregiver.   Place the balls of your feet on a step. With your hands, use only enough support from a wall or rail to keep your balance.  Keep your knees straight and rise up on your toes.  Slowly shift your weight entirely to your right / left toes and pick up your opposite foot. Gently and with controlled movement, lower your weight through your right / left foot so that your heel drops below the level of the step. You will feel a slight stretch in the back of your calf at the end position.  Use the healthy leg to help rise up onto the balls of both feet, then lower weight only on the right / left leg again. Build up to 15 repetitions. Then progress to 3 consecutive sets of 15 repetitions.*  After completing the above exercise, complete the same exercise with a slight knee bend (about 30 degrees). Again, build up to 15 repetitions. Then progress to 3 consecutive sets of 15 repetitions.* Perform this exercise __________ times per day.  *When you easily complete 3 sets of 15, your physician,  physical therapist or athletic trainer may advise you to add resistance by wearing a backpack filled with additional weight. STRENGTH - Plantar Flexors, Seated   Sit on a chair that allows your feet to rest flat on the ground. If necessary, sit at the edge of the chair.  Keeping your toes firmly on the ground, lift your right / left heel as far as you can without increasing any discomfort in your ankle. Repeat __________ times. Complete this exercise __________ times a day. *If instructed by your physician, physical therapist or athletic trainer, you may add ____________________ of resistance by placing a weighted object on your right / left knee. Document Released: 08/26/2004 Document Revised: 04/19/2011 Document Reviewed: 05/09/2008 [facility] Patient Information 2015 [place], [place]. This information is not intended to replace advice given to you by your health care provider. Make sure you discuss any questions you have with your health care provider.

## 2014-08-02 ENCOUNTER — Ambulatory Visit (INDEPENDENT_AMBULATORY_CARE_PROVIDER_SITE_OTHER): Payer: Managed Care, Other (non HMO) | Admitting: Podiatry

## 2014-08-02 VITALS — BP 113/80 | HR 90 | Resp 16

## 2014-08-02 DIAGNOSIS — L600 Ingrowing nail: Secondary | ICD-10-CM

## 2014-08-02 NOTE — Patient Instructions (Signed)

## 2014-08-03 NOTE — Progress Notes (Signed)
Subjective:     Patient ID: Karla Rodriguez, female   DOB: [birth year], 49 y.o.   MRN: [id number]  HPI 49 year old female presents the office today for complaints of reoccurring ingrown toenail to left big toe. She states the areas. Sen[place]ot up with her primary care physician however the ingrown toenail recurs causing pain particular shoe gear and pressure. She describes as a throbbing achy pain. She denies any recent redness or drainage on the nail sites or any swelling. This has been ongoing for several months. She denies any history of injury or trauma. She is diabetic and states her last HbA1c was 6.8. She denies any tender numbness to her feet are any claudication symptoms. Denies any history of ulceration. No other complaints at this time.  Review of Systems  All other systems reviewed and are negative.      Objective:   Physical Exam AAO x3, NAD DP/PT pulses palpable bilaterally, CRT less than 3 seconds Protective sensation intact with Simms Weinstein monofilament, vibratory sensation intact, Achilles tendon reflex intact There is evidence incurvation along both the medial and lateral nail borders of the left hallux toenail with tenderness palpation overlying both nail borders. There is no surrounding erythema, edema or drainage/purulence. There is also slight incurvation along the right hallux toenail borders have there is no tenderness to palpation overlying at this time and is no surrounding erythema, drainage or other signs of infection. No other areas of tenderness to bilateral lower extremities. MMT 5/5, ROM WNL.  No open lesions or pre-ulcerative lesions.  No overlying edema, erythema, increase in warmth to bilateral lower extremities.  No pain with calf compression, swelling, warmth, erythema bilaterally.      Assessment:     49 year old female with symptomatically reoccurring ingrown toenail left hallux (medial and lateral border)    Plan:     -Treatment options discussed  including all alternatives, risks, and complications. Discussed etiology of the ingrown toenail.  -At this time, the patient is requesting partial nail removal with chemical matricectomy to the symptomatic portion of the nail. Risks and complications were discussed with the patient for which they understand and  verbally consent to the procedure. Under sterile conditions a total of 3 mL of a mixture of 2% lidocaine plain and 0.5% Marcaine plain was infiltrated in a hallux block fashion. Once anesthetized, the skin was prepped in sterile fashion. A tourniquet was then applied. Next the medial and lateral aspect of hallux nail border was then sharply excised making sure to remove the entire offending nail border. Once the nails were ensured to be removed area was debrided and the underlying skin was intact. There is no purulence identified in the procedure. Next phenol was then applied under standard conditions and copiously irrigated. Silvadene was applied. A dry sterile dressing was applied. After application of the dressing the tourniquet was removed and there is found to be an immediate capillary refill time to the digit. The patient tolerated the procedure well any complications. Post procedure instructions were discussed the patient for which he verbally understood. Follow-up in one week for nail check or sooner if any problems are to arise. Discussed signs/symptoms of infection and directed to call the office immediately should any occur or go directly to the emergency room. In the meantime, encouraged to call the office with any questions, concerns, changes symptoms.  Celesta Gentile, DPM

## 2014-08-05 ENCOUNTER — Other Ambulatory Visit: Payer: Self-pay | Admitting: Family Medicine

## 2014-08-07 ENCOUNTER — Ambulatory Visit (INDEPENDENT_AMBULATORY_CARE_PROVIDER_SITE_OTHER): Payer: Managed Care, Other (non HMO) | Admitting: Physician Assistant

## 2014-08-07 ENCOUNTER — Telehealth: Payer: Self-pay | Admitting: *Deleted

## 2014-08-07 VITALS — BP 110/70 | HR 103 | Temp 98.5°F | Resp 18 | Wt 208.0 lb

## 2014-08-07 DIAGNOSIS — L600 Ingrowing nail: Secondary | ICD-10-CM | POA: Diagnosis not present

## 2014-08-07 DIAGNOSIS — M79675 Pain in left toe(s): Secondary | ICD-10-CM

## 2014-08-07 DIAGNOSIS — L03116 Cellulitis of left lower limb: Secondary | ICD-10-CM

## 2014-08-07 MED ORDER — CEPHALEXIN 500 MG PO CAPS: 500.0000 mg | ORAL_CAPSULE | Freq: Four times a day (QID) | ORAL | Status: DC

## 2014-08-07 NOTE — Telephone Encounter (Signed)
Pt states she had a toenail removed last week and it is red, oozing and painful, request an appt.  Pt is diabetic.

## 2014-08-07 NOTE — Patient Instructions (Signed)
Please take the keflex 4 times daily for 10 days. Be sure to keep foot uncovered and elevated when home.  Keep it wrapped when out and about.  Come back to see [place] asap with expanding redness, worsening pain, more pus draining, or fevers or chills.  Come to see [place] asap with streaking up the foot or leg.   Cellulitis Cellulitis is an infection of the skin and the tissue beneath it. The infected area is usually red and tender. Cellulitis occurs most often in the arms and lower legs.  CAUSES  Cellulitis is caused by bacteria that enter the skin through cracks or cuts in the skin. The most common types of bacteria that cause cellulitis are staphylococci and streptococci. SIGNS AND SYMPTOMS   Redness and warmth.  Swelling.  Tenderness or pain.  Fever. DIAGNOSIS  Your health care provider can usually determine what is wrong based on a physical exam. Blood tests may also be done. TREATMENT  Treatment usually involves taking an antibiotic medicine. HOME CARE INSTRUCTIONS   Take your antibiotic medicine as directed by your health care provider. Finish the antibiotic even if you start to feel better.  Keep the infected arm or leg elevated to reduce swelling.  Apply a warm cloth to the affected area up to 4 times per day to relieve pain.  Take medicines only as directed by your health care provider.  Keep all follow-up visits as directed by your health care provider. SEEK MEDICAL CARE IF:   You notice red streaks coming from the infected area.  Your red area gets larger or turns dark in color.  Your bone or joint underneath the infected area becomes painful after the skin has healed.  Your infection returns in the same area or another area.  You notice a swollen bump in the infected area.  You develop new symptoms.  You have a fever. SEEK IMMEDIATE MEDICAL CARE IF:   You feel very sleepy.  You develop vomiting or diarrhea.  You have a general ill feeling (malaise) with  muscle aches and pains. MAKE SURE YOU:   Understand these instructions.  Will watch your condition.  Will get help right away if you are not doing well or get worse. Document Released: 11/04/2004 Document Revised: 06/11/2013 Document Reviewed: 04/12/2011 [facility] Patient Information 2015 [place], [place]. This information is not intended to replace advice given to you by your health care provider. Make sure you discuss any questions you have with your health care provider.

## 2014-08-07 NOTE — Progress Notes (Signed)
   Subjective:    Patient ID: Karla Rodriguez, female    DOB: Apr 25, 1965, 49 y.o.   MRN: [id number]  Chief Complaint  Patient presents with  . infected toe    left great toe   Medications, allergies, past medical history, surgical history, family history, social history and problem list reviewed and updated.  HPI  28 yof with DM1 presents with concern for infected left great toe.   Had ingrown toenail removed both medial and lateral border by podiatrist on 08/02/14. Pt tolerated well. Presents today as has had increasing pain and redness of toe past couple days. Denies fevers, chills. Able to walk without difficulty.   Of note, was seen here with infected left toe one year ago. Treated with keflex tid, 2 days later had doxy added to regimen, then one day later received rocephin injx. Infx finally cleared. Today she states this is nowhere near as bad as that infection was.   Follows with endo regularly. [name].   Review of Systems See HPI.     Objective:   Physical Exam  Constitutional: She is oriented to person, place, and time. She appears well-developed and well-nourished.  Non-toxic appearance. She does not have a sickly appearance. She does not appear ill. No distress.  BP 110/70 mmHg  Pulse 103  Temp(Src) 98.5 F (36.9 C) (Oral)  Resp 18  Wt 208 lb (94.348 kg)  SpO2 98%   Neurological: She is alert and oriented to person, place, and time.  Skin:  Left great toe. Both medial and lateral nail edges removed as per previous note from 08/02/14. Nail bed visible with minimal oozing. Erythema extending from tip of toe to 1cm proximal of proximal nail edge. She feels this has advanced past couple days. Normal rom. Moderate ttp. Normal sensation.       Assessment & Plan:   21 yof with DM1 presents with concern for infected left great toe.   Cellulitis of foot, left - Plan: cephALEXin (KEFLEX) 500 MG capsule Pain of toe of left foot --erythema could be mild cellulitis vs  normal response to recent toenail procedure --will tx with antibiotics in this pt with hx dm1 and hx LE infections --keflex qid 10 days, has tolerated well in past --keep foot elevated as often as possible --foot wrapped in clinic today --rtc asap with expanding redness, purulence, fevers, chills, streaking  Julieta Gutting, PA-C Physician Assistant-Certified [facility]  08/07/2014 12:21 PM

## 2014-08-09 ENCOUNTER — Ambulatory Visit (INDEPENDENT_AMBULATORY_CARE_PROVIDER_SITE_OTHER): Payer: Managed Care, Other (non HMO) | Admitting: Podiatry

## 2014-08-09 ENCOUNTER — Encounter: Payer: Self-pay | Admitting: Podiatry

## 2014-08-09 VITALS — BP 113/69 | HR 88 | Resp 14

## 2014-08-09 DIAGNOSIS — Z9889 Other specified postprocedural states: Secondary | ICD-10-CM

## 2014-08-09 DIAGNOSIS — L600 Ingrowing nail: Secondary | ICD-10-CM

## 2014-08-09 NOTE — Patient Instructions (Signed)

## 2014-08-18 NOTE — Progress Notes (Signed)
Patient ID: Karla Rodriguez, female   DOB: 1965/09/18, 49 y.o.   MRN: [id number]  Subjective: 49 year old female presents the office today one-week status post left hallux partial nail avulsion with chemical matricectomy. She does state that a couple days of the procedure her toes did start to swell and become red and she went to her primary care physician who prescribed Keflex for which she is currently taking. She states that since starting the antibiotic the redness the swelling has decreased although there is some remaining redness. She denies any red streaks. She denies any purulence coming from the area. She denies any systemic complaints as fevers, chills, nausea, vomiting. Denies any calf pain, chest pain, shortness of breath. No other complaints at this time.  Objective: AAO x3, NAD DP/PT pulses palpable bilaterally, CRT less than 3 seconds Protective sensation intact with Simms Weinstein monofilament Status post left medial/lateral hallux partial nail avulsion. There is granulation tissue present within the procedure site. There is mild edema and erythema around the area however there is no ascending cellulitis. Erythema does not extend past the level of the IPJ. There is no areas of fluctuance or crepitus. There is no drainage or purulence expressed. No malodor. There is mild tenderness palpation over the procedure sites. There is no other areas tenderness to bilateral lower extremities. No pain with calf compression, selling, warmth, erythema. No other open lesions or pre-ulcerative lesions identified bilaterally.  Assessment: 49 year old female 1 week status post left hallux partial nail avulsion with possible resolving localized infection.  Plan: -Treatment options discussed including all alternatives, risks, and complications -Finish course of antibiotic. -Continue soaking in Epson salt soaks twice a day covering with antibiotic ointment and a Band-Aid. Can leave the area uncovered at  night. Continue this until the area has completely healed. -Follow-up 2 weeks or sooner if any problems arise. In the meantime, encouraged to call the office with any questions, concerns, change in symptoms.  -Monitor for any clinical signs or symptoms of infection and directed to call the office immediately should any occur or go to the ER.  Celesta Gentile, DPM

## 2014-08-31 ENCOUNTER — Telehealth: Payer: Self-pay | Admitting: Family Medicine

## 2014-08-31 NOTE — Telephone Encounter (Signed)
lmom to call and reschedule appt she had with Karla Rodriguez on Aug 24

## 2014-09-09 LAB — HEMOGLOBIN A1C: Hemoglobin A1C: 6.9

## 2014-09-17 ENCOUNTER — Telehealth: Payer: Self-pay

## 2014-09-17 MED ORDER — LISINOPRIL 10 MG PO TABS: ORAL_TABLET | ORAL | Status: DC

## 2014-09-17 NOTE — Telephone Encounter (Signed)
Pt LM asking for RF of lisinopril after getting a strange cancellation message from [employer]. I don't see any req from [employer] electronically and no reason anyone would have denied a req. Sent in 90 day RF, pt has appt end of Aug and was seen in May. Notified pt.

## 2014-10-02 ENCOUNTER — Ambulatory Visit: Payer: Managed Care, Other (non HMO) | Admitting: Family Medicine

## 2014-10-07 ENCOUNTER — Encounter: Payer: Self-pay | Admitting: Family Medicine

## 2014-10-07 ENCOUNTER — Ambulatory Visit (INDEPENDENT_AMBULATORY_CARE_PROVIDER_SITE_OTHER): Payer: Managed Care, Other (non HMO) | Admitting: Family Medicine

## 2014-10-07 VITALS — BP 121/77 | HR 99 | Temp 98.7°F | Resp 16 | Ht 65.0 in | Wt 208.2 lb

## 2014-10-07 DIAGNOSIS — F902 Attention-deficit hyperactivity disorder, combined type: Secondary | ICD-10-CM | POA: Diagnosis not present

## 2014-10-07 DIAGNOSIS — E109 Type 1 diabetes mellitus without complications: Secondary | ICD-10-CM

## 2014-10-07 DIAGNOSIS — E7800 Pure hypercholesterolemia, unspecified: Secondary | ICD-10-CM

## 2014-10-07 DIAGNOSIS — M791 Myalgia, unspecified site: Secondary | ICD-10-CM

## 2014-10-07 DIAGNOSIS — F1020 Alcohol dependence, uncomplicated: Secondary | ICD-10-CM

## 2014-10-07 DIAGNOSIS — I10 Essential (primary) hypertension: Secondary | ICD-10-CM | POA: Diagnosis not present

## 2014-10-07 DIAGNOSIS — Z720 Tobacco use: Secondary | ICD-10-CM

## 2014-10-07 DIAGNOSIS — K5901 Slow transit constipation: Secondary | ICD-10-CM

## 2014-10-07 DIAGNOSIS — F1021 Alcohol dependence, in remission: Secondary | ICD-10-CM

## 2014-10-07 DIAGNOSIS — Z23 Encounter for immunization: Secondary | ICD-10-CM | POA: Diagnosis not present

## 2014-10-07 DIAGNOSIS — Z1231 Encounter for screening mammogram for malignant neoplasm of breast: Secondary | ICD-10-CM

## 2014-10-07 DIAGNOSIS — F3178 Bipolar disorder, in full remission, most recent episode mixed: Secondary | ICD-10-CM

## 2014-10-07 DIAGNOSIS — F17200 Nicotine dependence, unspecified, uncomplicated: Secondary | ICD-10-CM

## 2014-10-07 MED ORDER — POLYETHYLENE GLYCOL 3350 17 GM/SCOOP PO POWD: 17.0000 g | Freq: Every day | ORAL | Status: DC

## 2014-10-07 MED ORDER — SENNA-DOCUSATE SODIUM 8.6-50 MG PO TABS: 1.0000 | ORAL_TABLET | Freq: Every day | ORAL | Status: DC

## 2014-10-07 NOTE — Patient Instructions (Signed)
1.  Hold Crestor for 3-6 months.
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
## 2014-10-07 NOTE — Addendum Note (Signed)
Addended by: Wardell Honour on: 10/07/2014 05:53 PM   Modules accepted: Orders

## 2014-10-14 ENCOUNTER — Other Ambulatory Visit: Payer: Self-pay

## 2014-10-14 DIAGNOSIS — Z1231 Encounter for screening mammogram for malignant neoplasm of breast: Secondary | ICD-10-CM

## 2014-12-15 ENCOUNTER — Other Ambulatory Visit: Payer: Self-pay | Admitting: Family Medicine

## 2015-01-19 ENCOUNTER — Other Ambulatory Visit: Payer: Self-pay | Admitting: Family Medicine

## 2015-02-06 ENCOUNTER — Encounter: Payer: Self-pay | Admitting: Family Medicine

## 2015-03-03 ENCOUNTER — Ambulatory Visit (INDEPENDENT_AMBULATORY_CARE_PROVIDER_SITE_OTHER): Payer: Managed Care, Other (non HMO) | Admitting: Family Medicine

## 2015-03-03 VITALS — BP 118/80 | HR 91 | Temp 98.0°F | Ht 64.5 in | Wt 212.0 lb

## 2015-03-03 DIAGNOSIS — J209 Acute bronchitis, unspecified: Secondary | ICD-10-CM | POA: Diagnosis not present

## 2015-03-03 MED ORDER — PREDNISONE 20 MG PO TABS: ORAL_TABLET | ORAL | Status: DC

## 2015-03-03 MED ORDER — AZITHROMYCIN 250 MG PO TABS: ORAL_TABLET | ORAL | Status: DC

## 2015-03-03 MED ORDER — HYDROCODONE-HOMATROPINE 5-1.5 MG/5ML PO SYRP: 5.0000 mL | ORAL_SOLUTION | Freq: Three times a day (TID) | ORAL | Status: DC | PRN
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
## 2015-03-03 NOTE — Patient Instructions (Signed)

## 2015-03-03 NOTE — Addendum Note (Signed)
Addended by: Jannette Spanner on: 03/03/2015 01:18 PM   Modules accepted: Orders

## 2015-03-08 ENCOUNTER — Telehealth: Payer: Self-pay

## 2015-03-08 ENCOUNTER — Other Ambulatory Visit: Payer: Self-pay | Admitting: Family Medicine

## 2015-03-08 NOTE — Telephone Encounter (Signed)
[facility] is [facility]   Best number [phone]

## 2015-03-10 MED ORDER — PANTOPRAZOLE SODIUM 40 MG PO TBEC: 40.0000 mg | DELAYED_RELEASE_TABLET | Freq: Every day | ORAL | Status: DC

## 2015-03-10 NOTE — Telephone Encounter (Signed)
[facility]

## 2015-03-17 ENCOUNTER — Encounter: Payer: Managed Care, Other (non HMO) | Admitting: Family Medicine

## 2015-03-25 ENCOUNTER — Encounter: Payer: Managed Care, Other (non HMO) | Admitting: Family Medicine

## 2015-03-28 ENCOUNTER — Encounter: Payer: Self-pay | Admitting: Gastroenterology

## 2015-03-28 ENCOUNTER — Ambulatory Visit (INDEPENDENT_AMBULATORY_CARE_PROVIDER_SITE_OTHER): Payer: Managed Care, Other (non HMO) | Admitting: Gastroenterology

## 2015-03-28 VITALS — BP 110/70 | HR 92 | Ht 64.0 in | Wt 214.1 lb

## 2015-03-28 DIAGNOSIS — K5909 Other constipation: Secondary | ICD-10-CM

## 2015-03-28 DIAGNOSIS — K5900 Constipation, unspecified: Secondary | ICD-10-CM | POA: Diagnosis not present

## 2015-03-28 DIAGNOSIS — R140 Abdominal distension (gaseous): Secondary | ICD-10-CM | POA: Diagnosis not present

## 2015-03-28 MED ORDER — LINACLOTIDE 145 MCG PO CAPS: 145.0000 ug | ORAL_CAPSULE | Freq: Every day | ORAL | Status: DC
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
## 2015-03-28 NOTE — Patient Instructions (Signed)
You have been scheduled to have an anorectal manometry at [facility] on 04-04-2015 at 8:30am. Please arrive 30 minutes prior to your appointment time for registration (1st floor of the hospital-admissions).  Please make certain to use 1 Fleets enema 2 hours prior to coming for your appointment. You can purchase Fleets enemas from the laxative section at your drug store. You should not eat anything during the two hours prior to the procedure. You may take regular medications with small sips of water at least 2 hours prior to the study.  Anorectal manometry is a test performed to evaluate patients with constipation or fecal incontinence. This test measures the pressures of the anal sphincter muscles, the sensation in the rectum, and the neural reflexes that are needed for normal bowel movements.  THE PROCEDURE The test takes approximately 30 minutes to 1 hour. You will be asked to change into a hospital gown. A technician or nurse will explain the procedure to you, take a brief health history, and answer any questions you may have. The patient then lies on his or her left side. A small, flexible tube, about the size of a thermometer, with a balloon at the end is inserted into the rectum. The catheter is connected to a machine that measures the pressure. During the test, the small balloon attached to the catheter may be inflated in the rectum to assess the normal reflex pathways. The nurse or technician may also ask the person to squeeze, relax, and push at various times. The anal sphincter muscle pressures are measured during each of these maneuvers. To squeeze, the patient tightens the sphincter muscles as if trying to prevent anything from coming out. To push or bear down, the patient strains down as if trying to have a bowel movement.  We have sent the following medications to your pharmacy for you to pick up at your convenience: Linzess  We have given you a FOD MAP diet to read and  review.  Thank you for choosing [facility]   Dr [place] Cellar

## 2015-04-02 ENCOUNTER — Other Ambulatory Visit: Payer: Self-pay

## 2015-04-02 DIAGNOSIS — K5900 Constipation, unspecified: Secondary | ICD-10-CM

## 2015-04-02 DIAGNOSIS — R140 Abdominal distension (gaseous): Secondary | ICD-10-CM

## 2015-04-04 ENCOUNTER — Encounter (HOSPITAL_COMMUNITY)
Admission: RE | Disposition: A | Discharge status: Home / Self Care | Payer: Self-pay | Source: Ambulatory Visit | Attending: Gastroenterology

## 2015-04-04 ENCOUNTER — Ambulatory Visit (HOSPITAL_COMMUNITY)
Admission: RE | Admit: 2015-04-04 | Discharge: 2015-04-04 | Disposition: A | Discharge status: Home / Self Care | Payer: Managed Care, Other (non HMO) | Source: Ambulatory Visit | Attending: Gastroenterology | Admitting: Gastroenterology

## 2015-04-04 DIAGNOSIS — K5900 Constipation, unspecified: Secondary | ICD-10-CM | POA: Diagnosis not present

## 2015-04-04 HISTORY — PX: ANAL RECTAL MANOMETRY: SHX6358

## 2015-04-04 SURGERY — MANOMETRY, ANORECTAL

## 2015-04-04 NOTE — Progress Notes (Signed)
Anal Manometry done per protocol. Patient tolerated well with no complications. Report to be sent to Dr. Doyne Keel office.

## 2015-04-07 ENCOUNTER — Encounter (HOSPITAL_COMMUNITY): Payer: Self-pay | Admitting: Gastroenterology

## 2015-04-10 ENCOUNTER — Telehealth: Payer: Self-pay | Admitting: Gastroenterology

## 2015-04-10 DIAGNOSIS — K5909 Other constipation: Secondary | ICD-10-CM

## 2015-04-10 NOTE — Telephone Encounter (Signed)
Anorectal manometry results returned, as interpreted by Dr. Silverio Decamp: Normal anal sphincter pressure Abnormal rectal sensation with first sensation at 70cc No evidence of dyssynergia, however patient failed balloon test and was unable to expel the balloon, which could be related to rectal hyposensitivity  I would recommend a trial of anorectal physical therapy to see if it can help her symptoms and improve rectal hyposensitivity. Karla Rodriguez can you please let her know and help make a referral to pelvic floor PT? She can see me for reassessment after this consultation. Thanks

## 2015-04-10 NOTE — Telephone Encounter (Signed)
Left a message for patient to call back. Referral made for pelvic floor PT([facility])

## 2015-04-10 NOTE — Telephone Encounter (Signed)
Spoke with patient and gave her results and recommendations. PT will be calling her to schedule the therapy. Patient will call back to schedule OV with Dr. Havery Moros after she has her therapy appointment.

## 2015-04-14 ENCOUNTER — Telehealth: Payer: Self-pay | Admitting: Gastroenterology

## 2015-04-14 MED ORDER — LINACLOTIDE 145 MCG PO CAPS: ORAL_CAPSULE | ORAL | Status: DC

## 2015-04-14 NOTE — Telephone Encounter (Signed)
Patient notified of recommendations. New rx sent to pharmacy.

## 2015-04-14 NOTE — Telephone Encounter (Signed)
Spoke with patient and she states the Linzess 145 mcg helped for a short period but has become hard stool again. She is also taking Miralax 2 doses/daily. Last bowel movement was Wed/Thursday and it was a hard stool. She is waiting to be scheduled for PT. Please, advise.

## 2015-04-14 NOTE — Telephone Encounter (Signed)
Regina please tell her to increase Linzess to 169mcg BID (max dose) and see if this helps. She can also titrate up the miralax to see if this helps, and will await her course of pelvic floor PT. thanks

## 2015-04-18 ENCOUNTER — Telehealth: Payer: Self-pay | Admitting: Gastroenterology

## 2015-04-18 NOTE — Telephone Encounter (Signed)
Called Rehab and they have tried to reach patient x 1. They will call her now. Appointment in [place] now.

## 2015-04-29 ENCOUNTER — Ambulatory Visit: Payer: Managed Care, Other (non HMO) | Attending: Gastroenterology | Admitting: Physical Therapy

## 2015-04-29 ENCOUNTER — Encounter: Payer: Self-pay | Admitting: Physical Therapy

## 2015-04-29 DIAGNOSIS — M6289 Other specified disorders of muscle: Secondary | ICD-10-CM | POA: Insufficient documentation

## 2015-04-29 DIAGNOSIS — R29898 Other symptoms and signs involving the musculoskeletal system: Secondary | ICD-10-CM

## 2015-04-29 DIAGNOSIS — R198 Other specified symptoms and signs involving the digestive system and abdomen: Secondary | ICD-10-CM | POA: Insufficient documentation

## 2015-04-29 DIAGNOSIS — N8184 Pelvic muscle wasting: Secondary | ICD-10-CM | POA: Insufficient documentation
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
## 2015-05-06 ENCOUNTER — Encounter: Payer: Managed Care, Other (non HMO) | Admitting: Physical Therapy

## 2015-05-13 ENCOUNTER — Encounter: Payer: Self-pay | Admitting: Physical Therapy

## 2015-05-13 ENCOUNTER — Ambulatory Visit: Payer: Managed Care, Other (non HMO) | Attending: Gastroenterology | Admitting: Physical Therapy

## 2015-05-13 DIAGNOSIS — M6281 Muscle weakness (generalized): Secondary | ICD-10-CM | POA: Insufficient documentation

## 2015-05-13 DIAGNOSIS — N8184 Pelvic muscle wasting: Secondary | ICD-10-CM | POA: Diagnosis present

## 2015-05-13 DIAGNOSIS — R29898 Other symptoms and signs involving the musculoskeletal system: Secondary | ICD-10-CM

## 2015-05-13 DIAGNOSIS — M6289 Other specified disorders of muscle: Secondary | ICD-10-CM

## 2015-05-13 DIAGNOSIS — R198 Other specified symptoms and signs involving the digestive system and abdomen: Secondary | ICD-10-CM

## 2015-05-13 NOTE — Patient Instructions (Signed)
Lower abdominal/core stability exercises  1. Practice your breathing technique: Inhale through your nose expanding your belly and rib cage. Try not to breathe into your chest. Exhale slowly and gradually out your mouth feeling a sense of softness to your body. Practice multiple times. This can be performed unlimited.  2. Finding the lower abdominals. Laying on your back with the knees bent, place your fingers just below your belly button. Using your breathing technique from above, on your exhale gently pull the belly button away from your fingertips without tensing any other muscles. Practice this 5x. Next, as you exhale, draw belly button inwards and hold onto it...then feel as if you are pulling that muscle across your pelvis like you are tightening a belt. This can be hard to do at first so be patient and practice. Do 5-10 reps 1-3 x day. Always recognize quality over quantity; if your abdominal muscles become tired you will notice you may tighten/contract other muscles. This is the time to take a break.   Practice this first laying on your back, then in sitting, progressing to standing and finally adding it to all your daily movements.    Start HEP from here down 3. Finding your pelvic floor. Using the breathing technique above, when your exhale, this time draw your pelvic floor muscles up as if you were attempting to stop the flow of urination. Be careful NOT to tense any other muscles. This can be hard, BE PATIENT. Try to hold up to 10 seconds repeating 10x. Try 2x a day. Once you feel you are doing this well, add this contraction to exercise #2. First contracting your pelvic floor followed by lower abdominals.   4. Adding leg movements. Add the following leg movements to challenge your ability to keep your core stable:  1. Single leg drop outs: Laying on your back with knees bent feet flat. Inhale,  dropping one knee outward KEEPING YOUR PELVIS STILL. Exhale as you bring the leg back,  simultaneously performing your lower abdominal contraction. Do 5-10 on each leg.   2. Marching: While keeping your pelvis still, lift the right foot a few inches, put it down then lift left foot. This will mimic a march. Start slow to establish control. Once you have control you may speed it up. Do 10-20x. You MUST keep your lower abdominlas contracted while you march. Breathe naturally    3. Single leg slides: Inhale while you slowly slide one leg out keeping your pelvis still. Only slide your leg as far as you can keep your pelvis still. Exhale as you bring the leg back to the start, contracting the lower abdominals as you do that. Keep your upper body relaxed. Do 5-10 on each side.       Position yourself as shown grabbing onto feet or behind the knees. You should feel a gentle stretch. Breathe in and allow the pelvic floor muscles to relax   Knee-to-Chest Stretch: Unilateral     Use a dog leash or strap around thigh. With hand behind right knee, pull knee in to chest until a comfortable stretch is felt in lower back and buttocks. Keep back relaxed. Hold _30___ seconds. Repeat _2___ times per set. Do _1___ sets per session. Do _1___ sessions per day.  http://orth.exer.us/126   Copyright  VHI. All rights reserved.   Piriformis Stretch    Lying on back, pull right knee toward opposite shoulder. Hold _30___ seconds. Repeat __2__ times. Do __1__ sessions per day.  http://gt2.exer.us/258  Copyright  VHI. All rights reserved.  Chair Sitting    Sit at edge of seat, spine straight, one leg extended, feet pointed upward. Put a hand on each thigh and bend forward from the hip, keeping spine straight. Allow hand on extended leg to reach toward toes. Support upper body with other arm. Hold _30__ seconds. Repeat 1___ times per session. Do _1__ sessions per day.  Copyright  VHI. All rights reserved.   Piriformis Stretch, Sitting    Sit, one ankle on opposite knee, same-side  hand on crossed knee. Push down on knee, keeping spine straight. Lean torso forward, with flat back, until tension is felt in hamstrings and gluteals of crossed-leg side. Hold _30__ seconds.  Repeat _1__ times per session. Do __1_ sessions per day.  Copyright  VHI. All rights reserved.   About Abdominal Massage  Abdominal massage, also called external colon massage, is a self-treatment circular massage technique that can reduce and eliminate gas and ease constipation. The colon naturally contracts in waves in a clockwise direction starting from inside the right hip, moving up toward the ribs, across the belly, and down inside the left hip.  When you perform circular abdominal massage, you help stimulate your colon's normal wave pattern of movement called peristalsis.  It is most beneficial when done after eating.  Positioning You can practice abdominal massage with oil while lying down, or in the shower with soap.  Some people find that it is just as effective to do the massage through clothing while sitting or standing.  How to Massage Start by placing your finger tips or knuckles on your right side, just inside your hip bone.  . Make small circular movements while you move upward toward your rib cage.   . Once you reach the bottom right side of your rib cage, take your circular movements across to the left side of the bottom of your rib cage.  . Next, move downward until you reach the inside of your left hip bone.  This is the path your feces travel in your colon. . Continue to perform your abdominal massage in this pattern for 10 minutes each day.     You can apply as much pressure as is comfortable in your massage.  Start gently and build pressure as you continue to practice.  Notice any areas of pain as you massage; areas of slight pain may be relieved as you massage, but if you have areas of significant or intense pain, consult with your healthcare provider.  Other  Considerations . General physical activity including bending and stretching can have a beneficial massage-like effect on the colon.  Deep breathing can also stimulate the colon because breathing deeply activates the same nervous system that supplies the colon.   . Abdominal massage should always be used in combination with a bowel-conscious diet that is high in the proper type of fiber for you, fluids (primarily water), and a regular exercise program.   [facility] [address], [place], [place] [address] Phone # [phone] Fax [phone]
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
## 2015-05-20 ENCOUNTER — Encounter: Payer: Managed Care, Other (non HMO) | Admitting: Physical Therapy

## 2015-05-27 ENCOUNTER — Encounter: Payer: Self-pay | Admitting: Physical Therapy

## 2015-05-27 ENCOUNTER — Ambulatory Visit: Payer: Managed Care, Other (non HMO) | Admitting: Physical Therapy

## 2015-05-27 DIAGNOSIS — N8184 Pelvic muscle wasting: Secondary | ICD-10-CM | POA: Diagnosis not present

## 2015-05-27 DIAGNOSIS — M6281 Muscle weakness (generalized): Secondary | ICD-10-CM

## 2015-05-27 NOTE — Patient Instructions (Addendum)
BACK: Child's Pose (Sciatica)    Sit in knee-chest position and reach arms forward. Separate knees for comfort. Hold position for __15_ breaths. Repeat __1_ times. Do _1Plank to Downward Dog (Support)    Hands sideways on seat, shoulders vertical to wrists, step back to plank position. Hold, abdominals engaged for _15___ breaths. Then slide hips back, lengthen spine, root heels, reach into downward dog. Hold for __15__ breaths. Repeat ___1_ times.  Copyright  VHI. All rights reserved.    Copyright  VHI. All rights reserved.  Breathing, Seated Twist (Chair)    Sit with legs over right side of chair. Twist torso to right, hips stable, and hold back of chair. Breathe deeply into lower abdomen. For twist to left, move to other side of chair. Remain in position for __15__ breaths. Repeat __1__ times each side.  Copyright  VHI. All rights reserved.  Adductor Stretch, Standing: Financial controller (Chair)    Press into outer heels, rotate front knee to line up with toes. Feet 2-3 ft apart, heels aligned front to back, rotate at hips. Bottom hand on chair, reach top arm toward ceiling. Keep leg muscles engaged, knees neither hyper-extended nor wobbly. Hold for __15__ breaths. Repeat __1__ times each side.  Copyright  VHI. All rights reserved.  Restorative Pose: Relaxed Center Pose    Arrange two folded blankets to support lower back, trunk, and head, with blocks to support knees. Remain in position for _2 ___ minutes.  Copyright  VHI. All rights reserved.  https://youtu.be/4syPT8gMDDA: Name of video is Guided Meditation for Pelvic Floor Relaxation.   [facility] [place] [address] [place], [place] [address] Phone # [phone] Fax [phone]
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
## 2015-06-01 ENCOUNTER — Other Ambulatory Visit: Payer: Self-pay | Admitting: Family Medicine

## 2015-06-03 ENCOUNTER — Ambulatory Visit: Payer: Managed Care, Other (non HMO) | Admitting: Physical Therapy

## 2015-06-11 ENCOUNTER — Ambulatory Visit (INDEPENDENT_AMBULATORY_CARE_PROVIDER_SITE_OTHER): Payer: Managed Care, Other (non HMO) | Admitting: Family Medicine

## 2015-06-11 VITALS — BP 124/70 | HR 95 | Temp 98.0°F | Resp 18 | Ht 64.0 in | Wt 220.2 lb

## 2015-06-11 DIAGNOSIS — E669 Obesity, unspecified: Secondary | ICD-10-CM | POA: Diagnosis not present

## 2015-06-11 DIAGNOSIS — F1020 Alcohol dependence, uncomplicated: Secondary | ICD-10-CM | POA: Diagnosis not present

## 2015-06-11 DIAGNOSIS — F3176 Bipolar disorder, in full remission, most recent episode depressed: Secondary | ICD-10-CM

## 2015-06-11 DIAGNOSIS — K5901 Slow transit constipation: Secondary | ICD-10-CM | POA: Diagnosis not present

## 2015-06-11 DIAGNOSIS — Z6837 Body mass index (BMI) 37.0-37.9, adult: Secondary | ICD-10-CM

## 2015-06-11 DIAGNOSIS — K589 Irritable bowel syndrome without diarrhea: Secondary | ICD-10-CM | POA: Diagnosis not present

## 2015-06-11 DIAGNOSIS — S39012A Strain of muscle, fascia and tendon of lower back, initial encounter: Secondary | ICD-10-CM

## 2015-06-11 DIAGNOSIS — F1021 Alcohol dependence, in remission: Secondary | ICD-10-CM

## 2015-06-11 MED ORDER — METAXALONE 800 MG PO TABS: 800.0000 mg | ORAL_TABLET | Freq: Four times a day (QID) | ORAL | Status: DC
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
## 2015-06-11 NOTE — Patient Instructions (Addendum)
   IF you received an x-ray today, you will receive an invoice from [facility]. Please contact [facility] at [phone] with questions or concerns regarding your invoice.   IF you received labwork today, you will receive an invoice from [facility]. Please contact [facility] at [phone] with questions or concerns regarding your invoice.   Our billing staff will not be able to assist you with questions regarding bills from these companies.  You will be contacted with the lab results as soon as they are available. The fastest way to get your results is to activate your My Chart account. Instructions are located on the last page of this paperwork. If you have not heard from us regarding the results in 2 weeks, please contact this office.     Low Back Sprain With Rehab A sprain is an injury in which a ligament is torn. The ligaments of the lower back are vulnerable to sprains. However, they are strong and require great force to be injured. These ligaments are important for stabilizing the spinal column. Sprains are classified into three categories. Grade 1 sprains cause pain, but the tendon is not lengthened. Grade 2 sprains include a lengthened ligament, due to the ligament being stretched or partially ruptured. With grade 2 sprains there is still function, although the function may be decreased. Grade 3 sprains involve a complete tear of the tendon or muscle, and function is usually impaired. SYMPTOMS   Severe pain in the lower back.  Sometimes, a feeling of a "pop," "snap," or tear, at the time of injury.  Tenderness and sometimes swelling at the injury site.  Uncommonly, bruising (contusion) within 48 hours of injury.  Muscle spasms in the back. CAUSES  Low back sprains occur when a force is placed on the ligaments that is greater than they can handle. Common causes of injury include:  Performing a stressful act while  off-balance.  Repetitive stressful activities that involve movement of the lower back.  Direct hit (trauma) to the lower back. RISK INCREASES WITH:  Contact sports (football, wrestling).  Collisions (major skiing accidents).  Sports that require throwing or lifting (baseball, weightlifting).  Sports involving twisting of the spine (gymnastics, diving, tennis, golf).  Poor strength and flexibility.  Inadequate protection.  Previous back injury or surgery (especially fusion). PREVENTION  Wear properly fitted and padded protective equipment.  Warm up and stretch properly before activity.  Allow for adequate recovery between workouts.  Maintain physical fitness:  Strength, flexibility, and endurance.  Cardiovascular fitness.  Maintain a healthy body weight. PROGNOSIS  If treated properly, low back sprains usually heal with non-surgical treatment. The length of time for healing depends on the severity of the injury.  RELATED COMPLICATIONS   Recurring symptoms, resulting in a chronic problem.  Chronic inflammation and pain in the low back.  Delayed healing or resolution of symptoms, especially if activity is resumed too soon.  Prolonged impairment.  Unstable or arthritic joints of the low back. TREATMENT  Treatment first involves the use of ice and medicine, to reduce pain and inflammation. The use of strengthening and stretching exercises may help reduce pain with activity. These exercises may be performed at home or with a therapist. Severe injuries may require referral to a therapist for further evaluation and treatment, such as ultrasound. Your caregiver may advise that you wear a back brace or corset, to help reduce pain and discomfort. Often, prolonged bed rest results in greater harm then benefit. Corticosteroid injections may   be recommended. However, these should be reserved for the most serious cases. It is important to avoid using your back when lifting objects.  At night, sleep on your back on a firm mattress, with a pillow placed under your knees. If non-surgical treatment is unsuccessful, surgery may be needed.  MEDICATION   If pain medicine is needed, nonsteroidal anti-inflammatory medicines (aspirin and ibuprofen), or other minor pain relievers (acetaminophen), are often advised.  Do not take pain medicine for 7 days before surgery.  Prescription pain relievers may be given, if your caregiver thinks they are needed. Use only as directed and only as much as you need.  Ointments applied to the skin may be helpful.  Corticosteroid injections may be given by your caregiver. These injections should be reserved for the most serious cases, because they may only be given a certain number of times. HEAT AND COLD  Cold treatment (icing) should be applied for 10 to 15 minutes every 2 to 3 hours for inflammation and pain, and immediately after activity that aggravates your symptoms. Use ice packs or an ice massage.  Heat treatment may be used before performing stretching and strengthening activities prescribed by your caregiver, physical therapist, or athletic trainer. Use a heat pack or a warm water soak. SEEK MEDICAL CARE IF:   Symptoms get worse or do not improve in 2 to 4 weeks, despite treatment.  You develop numbness or weakness in either leg.  You lose bowel or bladder function.  Any of the following occur after surgery: fever, increased pain, swelling, redness, drainage of fluids, or bleeding in the affected area.  New, unexplained symptoms develop. (Drugs used in treatment may produce side effects.) EXERCISES  RANGE OF MOTION (ROM) AND STRETCHING EXERCISES - Low Back Sprain Most people with lower back pain will find that their symptoms get worse with excessive bending forward (flexion) or arching at the lower back (extension). The exercises that will help resolve your symptoms will focus on the opposite motion.  Your physician, physical  therapist or athletic trainer will help you determine which exercises will be most helpful to resolve your lower back pain. Do not complete any exercises without first consulting with your caregiver. Discontinue any exercises which make your symptoms worse, until you speak to your caregiver. If you have pain, numbness or tingling which travels down into your buttocks, leg or foot, the goal of the therapy is for these symptoms to move closer to your back and eventually resolve. Sometimes, these leg symptoms will get better, but your lower back pain may worsen. This is often an indication of progress in your rehabilitation. Be very alert to any changes in your symptoms and the activities in which you participated in the 24 hours prior to the change. Sharing this information with your caregiver will allow him or her to most efficiently treat your condition. These exercises may help you when beginning to rehabilitate your injury. Your symptoms may resolve with or without further involvement from your physician, physical therapist or athletic trainer. While completing these exercises, remember:   Restoring tissue flexibility helps normal motion to return to the joints. This allows healthier, less painful movement and activity.  An effective stretch should be held for at least 30 seconds.  A stretch should never be painful. You should only feel a gentle lengthening or release in the stretched tissue. FLEXION RANGE OF MOTION AND STRETCHING EXERCISES: STRETCH - Flexion, Single Knee to Chest   Lie on a firm bed or floor with   both legs extended in front of you.  Keeping one leg in contact with the floor, bring your opposite knee to your chest. Hold your leg in place by either grabbing behind your thigh or at your knee.  Pull until you feel a gentle stretch in your low back. Hold __________ seconds.  Slowly release your grasp and repeat the exercise with the opposite side. Repeat __________ times. Complete  this exercise __________ times per day.  STRETCH - Flexion, Double Knee to Chest  Lie on a firm bed or floor with both legs extended in front of you.  Keeping one leg in contact with the floor, bring your opposite knee to your chest.  Tense your stomach muscles to support your back and then lift your other knee to your chest. Hold your legs in place by either grabbing behind your thighs or at your knees.  Pull both knees toward your chest until you feel a gentle stretch in your low back. Hold __________ seconds.  Tense your stomach muscles and slowly return one leg at a time to the floor. Repeat __________ times. Complete this exercise __________ times per day.  STRETCH - Low Trunk Rotation  Lie on a firm bed or floor. Keeping your legs in front of you, bend your knees so they are both pointed toward the ceiling and your feet are flat on the floor.  Extend your arms out to the side. This will stabilize your upper body by keeping your shoulders in contact with the floor.  Gently and slowly drop both knees together to one side until you feel a gentle stretch in your low back. Hold for __________ seconds.  Tense your stomach muscles to support your lower back as you bring your knees back to the starting position. Repeat the exercise to the other side. Repeat __________ times. Complete this exercise __________ times per day  EXTENSION RANGE OF MOTION AND FLEXIBILITY EXERCISES: STRETCH - Extension, Prone on Elbows   Lie on your stomach on the floor, a bed will be too soft. Place your palms about shoulder width apart and at the height of your head.  Place your elbows under your shoulders. If this is too painful, stack pillows under your chest.  Allow your body to relax so that your hips drop lower and make contact more completely with the floor.  Hold this position for __________ seconds.  Slowly return to lying flat on the floor. Repeat __________ times. Complete this exercise  __________ times per day.  RANGE OF MOTION - Extension, Prone Press Ups  Lie on your stomach on the floor, a bed will be too soft. Place your palms about shoulder width apart and at the height of your head.  Keeping your back as relaxed as possible, slowly straighten your elbows while keeping your hips on the floor. You may adjust the placement of your hands to maximize your comfort. As you gain motion, your hands will come more underneath your shoulders.  Hold this position __________ seconds.  Slowly return to lying flat on the floor. Repeat __________ times. Complete this exercise __________ times per day.  RANGE OF MOTION- Quadruped, Neutral Spine   Assume a hands and knees position on a firm surface. Keep your hands under your shoulders and your knees under your hips. You may place padding under your knees for comfort.  Drop your head and point your tailbone toward the ground below you. This will round out your lower back like an angry cat. Hold this position   for __________ seconds.  Slowly lift your head and release your tail bone so that your back sags into a large arch, like an old horse.  Hold this position for __________ seconds.  Repeat this until you feel limber in your low back.  Now, find your "sweet spot." This will be the most comfortable position somewhere between the two previous positions. This is your neutral spine. Once you have found this position, tense your stomach muscles to support your low back.  Hold this position for __________ seconds. Repeat __________ times. Complete this exercise __________ times per day.  STRENGTHENING EXERCISES - Low Back Sprain These exercises may help you when beginning to rehabilitate your injury. These exercises should be done near your "sweet spot." This is the neutral, low-back arch, somewhere between fully rounded and fully arched, that is your least painful position. When performed in this safe range of motion, these exercises  can be used for people who have either a flexion or extension based injury. These exercises may resolve your symptoms with or without further involvement from your physician, physical therapist or athletic trainer. While completing these exercises, remember:   Muscles can gain both the endurance and the strength needed for everyday activities through controlled exercises.  Complete these exercises as instructed by your physician, physical therapist or athletic trainer. Increase the resistance and repetitions only as guided.  You may experience muscle soreness or fatigue, but the pain or discomfort you are trying to eliminate should never worsen during these exercises. If this pain does worsen, stop and make certain you are following the directions exactly. If the pain is still present after adjustments, discontinue the exercise until you can discuss the trouble with your caregiver. STRENGTHENING - Deep Abdominals, Pelvic Tilt   Lie on a firm bed or floor. Keeping your legs in front of you, bend your knees so they are both pointed toward the ceiling and your feet are flat on the floor.  Tense your lower abdominal muscles to press your low back into the floor. This motion will rotate your pelvis so that your tail bone is scooping upwards rather than pointing at your feet or into the floor. With a gentle tension and even breathing, hold this position for __________ seconds. Repeat __________ times. Complete this exercise __________ times per day.  STRENGTHENING - Abdominals, Crunches   Lie on a firm bed or floor. Keeping your legs in front of you, bend your knees so they are both pointed toward the ceiling and your feet are flat on the floor. Cross your arms over your chest.  Slightly tip your chin down without bending your neck.  Tense your abdominals and slowly lift your trunk high enough to just clear your shoulder blades. Lifting higher can put excessive stress on the lower back and does not  further strengthen your abdominal muscles.  Control your return to the starting position. Repeat __________ times. Complete this exercise __________ times per day.  STRENGTHENING - Quadruped, Opposite UE/LE Lift   Assume a hands and knees position on a firm surface. Keep your hands under your shoulders and your knees under your hips. You may place padding under your knees for comfort.  Find your neutral spine and gently tense your abdominal muscles so that you can maintain this position. Your shoulders and hips should form a rectangle that is parallel with the floor and is not twisted.  Keeping your trunk steady, lift your right hand no higher than your shoulder and then your left   leg no higher than your hip. Make sure you are not holding your breath. Hold this position for __________ seconds.  Continuing to keep your abdominal muscles tense and your back steady, slowly return to your starting position. Repeat with the opposite arm and leg. Repeat __________ times. Complete this exercise __________ times per day.  STRENGTHENING - Abdominals and Quadriceps, Straight Leg Raise   Lie on a firm bed or floor with both legs extended in front of you.  Keeping one leg in contact with the floor, bend the other knee so that your foot can rest flat on the floor.  Find your neutral spine, and tense your abdominal muscles to maintain your spinal position throughout the exercise.  Slowly lift your straight leg off the floor about 6 inches for a count of 15, making sure to not hold your breath.  Still keeping your neutral spine, slowly lower your leg all the way to the floor. Repeat this exercise with each leg __________ times. Complete this exercise __________ times per day. POSTURE AND BODY MECHANICS CONSIDERATIONS - Low Back Sprain Keeping correct posture when sitting, standing or completing your activities will reduce the stress put on different body tissues, allowing injured tissues a chance to heal  and limiting painful experiences. The following are general guidelines for improved posture. Your physician or physical therapist will provide you with any instructions specific to your needs. While reading these guidelines, remember:  The exercises prescribed by your provider will help you have the flexibility and strength to maintain correct postures.  The correct posture provides the best environment for your joints to work. All of your joints have less wear and tear when properly supported by a spine with good posture. This means you will experience a healthier, less painful body.  Correct posture must be practiced with all of your activities, especially prolonged sitting and standing. Correct posture is as important when doing repetitive low-stress activities (typing) as it is when doing a single heavy-load activity (lifting). RESTING POSITIONS Consider which positions are most painful for you when choosing a resting position. If you have pain with flexion-based activities (sitting, bending, stooping, squatting), choose a position that allows you to rest in a less flexed posture. You would want to avoid curling into a fetal position on your side. If your pain worsens with extension-based activities (prolonged standing, working overhead), avoid resting in an extended position such as sleeping on your stomach. Most people will find more comfort when they rest with their spine in a more neutral position, neither too rounded nor too arched. Lying on a non-sagging bed on your side with a pillow between your knees, or on your back with a pillow under your knees will often provide some relief. Keep in mind, being in any one position for a prolonged period of time, no matter how correct your posture, can still lead to stiffness. PROPER SITTING POSTURE In order to minimize stress and discomfort on your spine, you must sit with correct posture. Sitting with good posture should be effortless for a healthy body.  Returning to good posture is a gradual process. Many people can work toward this most comfortably by using various supports until they have the flexibility and strength to maintain this posture on their own. When sitting with proper posture, your ears will fall over your shoulders and your shoulders will fall over your hips. You should use the back of the chair to support your upper back. Your lower back will be in a neutral   position, just slightly arched. You may place a small pillow or folded towel at the base of your lower back for  support.  When working at a desk, create an environment that supports good, upright posture. Without extra support, muscles tire, which leads to excessive strain on joints and other tissues. Keep these recommendations in mind: CHAIR:  A chair should be able to slide under your desk when your back makes contact with the back of the chair. This allows you to work closely.  The chair's height should allow your eyes to be level with the upper part of your monitor and your hands to be slightly lower than your elbows. BODY POSITION  Your feet should make contact with the floor. If this is not possible, use a foot rest.  Keep your ears over your shoulders. This will reduce stress on your neck and low back. INCORRECT SITTING POSTURES  If you are feeling tired and unable to assume a healthy sitting posture, do not slouch or slump. This puts excessive strain on your back tissues, causing more damage and pain. Healthier options include:  Using more support, like a lumbar pillow.  Switching tasks to something that requires you to be upright or walking.  Talking a brief walk.  Lying down to rest in a neutral-spine position. PROLONGED STANDING WHILE SLIGHTLY LEANING FORWARD  When completing a task that requires you to lean forward while standing in one place for a long time, place either foot up on a stationary 2-4 inch high object to help maintain the best posture. When  both feet are on the ground, the lower back tends to lose its slight inward curve. If this curve flattens (or becomes too large), then the back and your other joints will experience too much stress, tire more quickly, and can cause pain. CORRECT STANDING POSTURES Proper standing posture should be assumed with all daily activities, even if they only take a few moments, like when brushing your teeth. As in sitting, your ears should fall over your shoulders and your shoulders should fall over your hips. You should keep a slight tension in your abdominal muscles to brace your spine. Your tailbone should point down to the ground, not behind your body, resulting in an over-extended swayback posture.  INCORRECT STANDING POSTURES  Common incorrect standing postures include a forward head, locked knees and/or an excessive swayback. WALKING Walk with an upright posture. Your ears, shoulders and hips should all line-up. PROLONGED ACTIVITY IN A FLEXED POSITION When completing a task that requires you to bend forward at your waist or lean over a low surface, try to find a way to stabilize 3 out of 4 of your limbs. You can place a hand or elbow on your thigh or rest a knee on the surface you are reaching across. This will provide you more stability, so that your muscles do not tire as quickly. By keeping your knees relaxed, or slightly bent, you will also reduce stress across your lower back. CORRECT LIFTING TECHNIQUES DO :  Assume a wide stance. This will provide you more stability and the opportunity to get as close as possible to the object which you are lifting.  Tense your abdominals to brace your spine. Bend at the knees and hips. Keeping your back locked in a neutral-spine position, lift using your leg muscles. Lift with your legs, keeping your back straight.  Test the weight of unknown objects before attempting to lift them.  Try to keep your elbows locked down   at your sides in order get the best  strength from your shoulders when carrying an object.  Always ask for help when lifting heavy or awkward objects. INCORRECT LIFTING TECHNIQUES DO NOT:   Lock your knees when lifting, even if it is a small object.  Bend and twist. Pivot at your feet or move your feet when needing to change directions.  Assume that you can safely pick up even a paperclip without proper posture.   This information is not intended to replace advice given to you by your health care provider. Make sure you discuss any questions you have with your health care provider.   Document Released: 01/25/2005 Document Revised: 02/15/2014 Document Reviewed: 05/09/2008 [employer] Interactive Patient Education 2016 [employer]  

## 2015-07-10 ENCOUNTER — Ambulatory Visit (INDEPENDENT_AMBULATORY_CARE_PROVIDER_SITE_OTHER): Payer: Managed Care, Other (non HMO)

## 2015-07-10 ENCOUNTER — Ambulatory Visit (INDEPENDENT_AMBULATORY_CARE_PROVIDER_SITE_OTHER): Payer: Managed Care, Other (non HMO) | Admitting: Family Medicine

## 2015-07-10 VITALS — BP 122/80 | HR 86 | Temp 98.4°F | Resp 18 | Ht 64.0 in | Wt 220.0 lb

## 2015-07-10 DIAGNOSIS — M542 Cervicalgia: Secondary | ICD-10-CM

## 2015-07-10 DIAGNOSIS — S161XXA Strain of muscle, fascia and tendon at neck level, initial encounter: Secondary | ICD-10-CM | POA: Diagnosis not present

## 2015-07-10 MED ORDER — PREDNISONE 20 MG PO TABS: ORAL_TABLET | ORAL | Status: DC

## 2015-07-10 NOTE — Patient Instructions (Addendum)
1.  Tylenol -- 2 every 6 hours as needed for pain. 2. Stop Ibuprofen while taking Prednisone.  3. Continue Skelaxin four times daily. 4.  Continue heat for 15 minutes twice daily.     IF you received an x-ray today, you will receive an invoice from [facility]. Please contact [facility] at [phone] with questions or concerns regarding your invoice.   IF you received labwork today, you will receive an invoice from [employer]. Please contact [facility] at [phone] with questions or concerns regarding your invoice.   Our billing staff will not be able to assist you with questions regarding bills from these companies.  You will be contacted with the lab results as soon as they are available. The fastest way to get your results is to activate your My Chart account. Instructions are located on the last page of this paperwork. If you have not heard from [place] regarding the results in 2 weeks, please contact this office.     Cervical Strain and Sprain With Rehab Cervical strain and sprain are injuries that commonly occur with "whiplash" injuries. Whiplash occurs when the neck is forcefully whipped backward or forward, such as during a motor vehicle accident or during contact sports. The muscles, ligaments, tendons, discs, and nerves of the neck are susceptible to injury when this occurs. RISK FACTORS Risk of having a whiplash injury increases if:  Osteoarthritis of the spine.  Situations that make head or neck accidents or trauma more likely.  High-risk sports (football, rugby, wrestling, hockey, auto racing, gymnastics, diving, contact karate, or boxing).  Poor strength and flexibility of the neck.  Previous neck injury.  Poor tackling technique.  Improperly fitted or padded equipment. SYMPTOMS   Pain or stiffness in the front or back of neck or both.  Symptoms may present immediately or up to 24 hours after injury.  Dizziness,  headache, nausea, and vomiting.  Muscle spasm with soreness and stiffness in the neck.  Tenderness and swelling at the injury site. PREVENTION  Learn and use proper technique (avoid tackling with the head, spearing, and head-butting; use proper falling techniques to avoid landing on the head).  Warm up and stretch properly before activity.  Maintain physical fitness:  Strength, flexibility, and endurance.  Cardiovascular fitness.  Wear properly fitted and padded protective equipment, such as padded soft collars, for participation in contact sports. PROGNOSIS  Recovery from cervical strain and sprain injuries is dependent on the extent of the injury. These injuries are usually curable in 1 week to 3 months with appropriate treatment.  RELATED COMPLICATIONS   Temporary numbness and weakness may occur if the nerve roots are damaged, and this may persist until the nerve has completely healed.  Chronic pain due to frequent recurrence of symptoms.  Prolonged healing, especially if activity is resumed too soon (before complete recovery). TREATMENT  Treatment initially involves the use of ice and medication to help reduce pain and inflammation. It is also important to perform strengthening and stretching exercises and modify activities that worsen symptoms so the injury does not get worse. These exercises may be performed at home or with a therapist. For patients who experience severe symptoms, a soft, padded collar may be recommended to be worn around the neck.  Improving your posture may help reduce symptoms. Posture improvement includes pulling your chin and abdomen in while sitting or standing. If you are sitting, sit in a firm chair with your buttocks against the back of the chair. While sleeping,  try replacing your pillow with a small towel rolled to 2 inches in diameter, or use a cervical pillow or soft cervical collar. Poor sleeping positions delay healing.  For patients with nerve root  damage, which causes numbness or weakness, the use of a cervical traction apparatus may be recommended. Surgery is rarely necessary for these injuries. However, cervical strain and sprains that are present at birth (congenital) may require surgery. MEDICATION   If pain medication is necessary, nonsteroidal anti-inflammatory medications, such as aspirin and ibuprofen, or other minor pain relievers, such as acetaminophen, are often recommended.  Do not take pain medication for 7 days before surgery.  Prescription pain relievers may be given if deemed necessary by your caregiver. Use only as directed and only as much as you need. HEAT AND COLD:   Cold treatment (icing) relieves pain and reduces inflammation. Cold treatment should be applied for 10 to 15 minutes every 2 to 3 hours for inflammation and pain and immediately after any activity that aggravates your symptoms. Use ice packs or an ice massage.  Heat treatment may be used prior to performing the stretching and strengthening activities prescribed by your caregiver, physical therapist, or athletic trainer. Use a heat pack or a warm soak. SEEK MEDICAL CARE IF:   Symptoms get worse or do not improve in 2 weeks despite treatment.  New, unexplained symptoms develop (drugs used in treatment may produce side effects). EXERCISES RANGE OF MOTION (ROM) AND STRETCHING EXERCISES - Cervical Strain and Sprain These exercises may help you when beginning to rehabilitate your injury. In order to successfully resolve your symptoms, you must improve your posture. These exercises are designed to help reduce the forward-head and rounded-shoulder posture which contributes to this condition. Your symptoms may resolve with or without further involvement from your physician, physical therapist or athletic trainer. While completing these exercises, remember:   Restoring tissue flexibility helps normal motion to return to the joints. This allows healthier, less  painful movement and activity.  An effective stretch should be held for at least 20 seconds, although you may need to begin with shorter hold times for comfort.  A stretch should never be painful. You should only feel a gentle lengthening or release in the stretched tissue. STRETCH- Axial Extensors  Lie on your back on the floor. You may bend your knees for comfort. Place a rolled-up hand towel or dish towel, about 2 inches in diameter, under the part of your head that makes contact with the floor.  Gently tuck your chin, as if trying to make a "double chin," until you feel a gentle stretch at the base of your head.  Hold __________ seconds. Repeat __________ times. Complete this exercise __________ times per day.  STRETCH - Axial Extension   Stand or sit on a firm surface. Assume a good posture: chest up, shoulders drawn back, abdominal muscles slightly tense, knees unlocked (if standing) and feet hip width apart.  Slowly retract your chin so your head slides back and your chin slightly lowers. Continue to look straight ahead.  You should feel a gentle stretch in the back of your head. Be certain not to feel an aggressive stretch since this can cause headaches later.  Hold for __________ seconds. Repeat __________ times. Complete this exercise __________ times per day. STRETCH - Cervical Side Bend   Stand or sit on a firm surface. Assume a good posture: chest up, shoulders drawn back, abdominal muscles slightly tense, knees unlocked (if standing) and feet hip width  apart.  Without letting your nose or shoulders move, slowly tip your right / left ear to your shoulder until your feel a gentle stretch in the muscles on the opposite side of your neck.  Hold __________ seconds. Repeat __________ times. Complete this exercise __________ times per day. STRETCH - Cervical Rotators   Stand or sit on a firm surface. Assume a good posture: chest up, shoulders drawn back, abdominal muscles  slightly tense, knees unlocked (if standing) and feet hip width apart.  Keeping your eyes level with the ground, slowly turn your head until you feel a gentle stretch along the back and opposite side of your neck.  Hold __________ seconds. Repeat __________ times. Complete this exercise __________ times per day. RANGE OF MOTION - Neck Circles   Stand or sit on a firm surface. Assume a good posture: chest up, shoulders drawn back, abdominal muscles slightly tense, knees unlocked (if standing) and feet hip width apart.  Gently roll your head down and around from the back of one shoulder to the back of the other. The motion should never be forced or painful.  Repeat the motion 10-20 times, or until you feel the neck muscles relax and loosen. Repeat __________ times. Complete the exercise __________ times per day. STRENGTHENING EXERCISES - Cervical Strain and Sprain These exercises may help you when beginning to rehabilitate your injury. They may resolve your symptoms with or without further involvement from your physician, physical therapist, or athletic trainer. While completing these exercises, remember:   Muscles can gain both the endurance and the strength needed for everyday activities through controlled exercises.  Complete these exercises as instructed by your physician, physical therapist, or athletic trainer. Progress the resistance and repetitions only as guided.  You may experience muscle soreness or fatigue, but the pain or discomfort you are trying to eliminate should never worsen during these exercises. If this pain does worsen, stop and make certain you are following the directions exactly. If the pain is still present after adjustments, discontinue the exercise until you can discuss the trouble with your clinician. STRENGTH - Cervical Flexors, Isometric  Face a wall, standing about 6 inches away. Place a small pillow, a ball about 6-8 inches in diameter, or a folded towel between  your forehead and the wall.  Slightly tuck your chin and gently push your forehead into the soft object. Push only with mild to moderate intensity, building up tension gradually. Keep your jaw and forehead relaxed.  Hold 10 to 20 seconds. Keep your breathing relaxed.  Release the tension slowly. Relax your neck muscles completely before you start the next repetition. Repeat __________ times. Complete this exercise __________ times per day. STRENGTH- Cervical Lateral Flexors, Isometric   Stand about 6 inches away from a wall. Place a small pillow, a ball about 6-8 inches in diameter, or a folded towel between the side of your head and the wall.  Slightly tuck your chin and gently tilt your head into the soft object. Push only with mild to moderate intensity, building up tension gradually. Keep your jaw and forehead relaxed.  Hold 10 to 20 seconds. Keep your breathing relaxed.  Release the tension slowly. Relax your neck muscles completely before you start the next repetition. Repeat __________ times. Complete this exercise __________ times per day. STRENGTH - Cervical Extensors, Isometric   Stand about 6 inches away from a wall. Place a small pillow, a ball about 6-8 inches in diameter, or a folded towel between the back of  your head and the wall.  Slightly tuck your chin and gently tilt your head back into the soft object. Push only with mild to moderate intensity, building up tension gradually. Keep your jaw and forehead relaxed.  Hold 10 to 20 seconds. Keep your breathing relaxed.  Release the tension slowly. Relax your neck muscles completely before you start the next repetition. Repeat __________ times. Complete this exercise __________ times per day. POSTURE AND BODY MECHANICS CONSIDERATIONS - Cervical Strain and Sprain Keeping correct posture when sitting, standing or completing your activities will reduce the stress put on different body tissues, allowing injured tissues a chance  to heal and limiting painful experiences. The following are general guidelines for improved posture. Your physician or physical therapist will provide you with any instructions specific to your needs. While reading these guidelines, remember:  The exercises prescribed by your provider will help you have the flexibility and strength to maintain correct postures.  The correct posture provides the optimal environment for your joints to work. All of your joints have less wear and tear when properly supported by a spine with good posture. This means you will experience a healthier, less painful body.  Correct posture must be practiced with all of your activities, especially prolonged sitting and standing. Correct posture is as important when doing repetitive low-stress activities (typing) as it is when doing a single heavy-load activity (lifting). PROLONGED STANDING WHILE SLIGHTLY LEANING FORWARD When completing a task that requires you to lean forward while standing in one place for a long time, place either foot up on a stationary 2- to 4-inch high object to help maintain the best posture. When both feet are on the ground, the low back tends to lose its slight inward curve. If this curve flattens (or becomes too large), then the back and your other joints will experience too much stress, fatigue more quickly, and can cause pain.  RESTING POSITIONS Consider which positions are most painful for you when choosing a resting position. If you have pain with flexion-based activities (sitting, bending, stooping, squatting), choose a position that allows you to rest in a less flexed posture. You would want to avoid curling into a fetal position on your side. If your pain worsens with extension-based activities (prolonged standing, working overhead), avoid resting in an extended position such as sleeping on your stomach. Most people will find more comfort when they rest with their spine in a more neutral position,  neither too rounded nor too arched. Lying on a non-sagging bed on your side with a pillow between your knees, or on your back with a pillow under your knees will often provide some relief. Keep in mind, being in any one position for a prolonged period of time, no matter how correct your posture, can still lead to stiffness. WALKING Walk with an upright posture. Your ears, shoulders, and hips should all line up. OFFICE WORK When working at a desk, create an environment that supports good, upright posture. Without extra support, muscles fatigue and lead to excessive strain on joints and other tissues. CHAIR:  A chair should be able to slide under your desk when your back makes contact with the back of the chair. This allows you to work closely.  The chair's height should allow your eyes to be level with the upper part of your monitor and your hands to be slightly lower than your elbows.  Body position:  Your feet should make contact with the floor. If this is not possible, use  a foot rest.  Keep your ears over your shoulders. This will reduce stress on your neck and low back.   This information is not intended to replace advice given to you by your health care provider. Make sure you discuss any questions you have with your health care provider.   Document Released: 01/25/2005 Document Revised: 02/15/2014 Document Reviewed: 05/09/2008 Elsevier Interactive Patient Education [employer].
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
## 2015-07-15 ENCOUNTER — Encounter: Payer: Self-pay | Admitting: Family Medicine

## 2015-07-22 ENCOUNTER — Other Ambulatory Visit: Payer: Self-pay | Admitting: Family Medicine

## 2015-07-23 MED ORDER — LISINOPRIL 10 MG PO TABS: 10.0000 mg | ORAL_TABLET | Freq: Every day | ORAL | Status: DC

## 2015-08-23 ENCOUNTER — Ambulatory Visit (INDEPENDENT_AMBULATORY_CARE_PROVIDER_SITE_OTHER): Payer: Managed Care, Other (non HMO) | Admitting: Family Medicine

## 2015-08-23 VITALS — BP 116/60 | HR 101 | Temp 98.3°F | Resp 16 | Ht 64.0 in | Wt 219.0 lb

## 2015-08-23 DIAGNOSIS — J301 Allergic rhinitis due to pollen: Secondary | ICD-10-CM

## 2015-08-23 DIAGNOSIS — F3176 Bipolar disorder, in full remission, most recent episode depressed: Secondary | ICD-10-CM | POA: Diagnosis not present

## 2015-08-23 DIAGNOSIS — I10 Essential (primary) hypertension: Secondary | ICD-10-CM

## 2015-08-23 DIAGNOSIS — H9313 Tinnitus, bilateral: Secondary | ICD-10-CM

## 2015-08-23 DIAGNOSIS — E109 Type 1 diabetes mellitus without complications: Secondary | ICD-10-CM | POA: Diagnosis not present

## 2015-08-23 DIAGNOSIS — G4730 Sleep apnea, unspecified: Secondary | ICD-10-CM | POA: Diagnosis not present

## 2015-08-23 DIAGNOSIS — K5901 Slow transit constipation: Secondary | ICD-10-CM

## 2015-08-23 MED ORDER — LISINOPRIL 10 MG PO TABS: 10.0000 mg | ORAL_TABLET | Freq: Every day | ORAL | Status: DC
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
## 2015-08-23 NOTE — Patient Instructions (Addendum)
1.  Start taking allergy pill for one week.    IF you received an x-ray today, you will receive an invoice from [facility]. Please contact [facility] at [phone] with questions or concerns regarding your invoice.   IF you received labwork today, you will receive an invoice from [facility]. Please contact [facility] at [phone] with questions or concerns regarding your invoice.   Our billing staff will not be able to assist you with questions regarding bills from these companies.  You will be contacted with the lab results as soon as they are available. The fastest way to get your results is to activate your My Chart account. Instructions are located on the last page of this paperwork. If you have not heard from [place] regarding the results in 2 weeks, please contact this office.

## 2015-08-28 ENCOUNTER — Encounter: Payer: Self-pay | Admitting: Family Medicine

## 2015-08-28 DIAGNOSIS — E109 Type 1 diabetes mellitus without complications: Secondary | ICD-10-CM

## 2015-09-06 ENCOUNTER — Other Ambulatory Visit: Payer: Self-pay | Admitting: Family Medicine

## 2015-09-08 NOTE — Telephone Encounter (Signed)
Dr Tamala Julian, you saw pt this month for check up, but don't see GERD discussed recently. Do you want to RF, pt to RTC or this to be managed by GI?

## 2015-09-20 ENCOUNTER — Ambulatory Visit (INDEPENDENT_AMBULATORY_CARE_PROVIDER_SITE_OTHER): Payer: Managed Care, Other (non HMO)

## 2015-09-20 ENCOUNTER — Ambulatory Visit (INDEPENDENT_AMBULATORY_CARE_PROVIDER_SITE_OTHER): Payer: Managed Care, Other (non HMO) | Admitting: Family Medicine

## 2015-09-20 VITALS — BP 114/76 | HR 93 | Temp 99.3°F | Resp 20 | Ht 64.0 in | Wt 219.2 lb

## 2015-09-20 DIAGNOSIS — J069 Acute upper respiratory infection, unspecified: Secondary | ICD-10-CM | POA: Diagnosis not present

## 2015-09-20 DIAGNOSIS — J4541 Moderate persistent asthma with (acute) exacerbation: Secondary | ICD-10-CM | POA: Diagnosis not present

## 2015-09-20 DIAGNOSIS — S161XXD Strain of muscle, fascia and tendon at neck level, subsequent encounter: Secondary | ICD-10-CM | POA: Diagnosis not present

## 2015-09-20 DIAGNOSIS — R062 Wheezing: Secondary | ICD-10-CM

## 2015-09-20 DIAGNOSIS — E109 Type 1 diabetes mellitus without complications: Secondary | ICD-10-CM

## 2015-09-20 LAB — POCT CBC
GRANULOCYTE PERCENT: 81.3 % — AB (ref 37–80)
HCT, POC: 42.6 % (ref 37.7–47.9)
Hemoglobin: 15.1 g/dL (ref 12.2–16.2)
Lymph, poc: 1 (ref 0.6–3.4)
MCH: 31.5 pg — AB (ref 27–31.2)
MCHC: 35.5 g/dL — AB (ref 31.8–35.4)
MCV: 88.8 fL (ref 80–97)
MID (cbc): 0.4 (ref 0–0.9)
MPV: 8.7 fL (ref 0–99.8)
PLATELET COUNT, POC: 209 10*3/uL (ref 142–424)
POC Granulocyte: 6.3 (ref 2–6.9)
POC LYMPH PERCENT: 13.3 %L (ref 10–50)
POC MID %: 5.4 %M (ref 0–12)
RBC: 4.79 M/uL (ref 4.04–5.48)
RDW, POC: 12.3 %
WBC: 7.8 10*3/uL (ref 4.6–10.2)

## 2015-09-20 LAB — GLUCOSE, POCT (MANUAL RESULT ENTRY): POC GLUCOSE: 204 mg/dL — AB (ref 70–99)

## 2015-09-20 MED ORDER — MONTELUKAST SODIUM 10 MG PO TABS: 10.0000 mg | ORAL_TABLET | Freq: Every day | ORAL | 3 refills | Status: DC

## 2015-09-20 MED ORDER — DOXYCYCLINE HYCLATE 100 MG PO CAPS: 100.0000 mg | ORAL_CAPSULE | Freq: Two times a day (BID) | ORAL | 0 refills | Status: DC

## 2015-09-20 MED ORDER — FLUTICASONE PROPIONATE 50 MCG/ACT NA SUSP: 2.0000 | Freq: Every day | NASAL | 12 refills | Status: DC

## 2015-09-20 MED ORDER — ALBUTEROL SULFATE (2.5 MG/3ML) 0.083% IN NEBU: 2.5000 mg | INHALATION_SOLUTION | Freq: Four times a day (QID) | RESPIRATORY_TRACT | 1 refills | Status: DC | PRN

## 2015-09-20 MED ORDER — ALBUTEROL SULFATE HFA 108 (90 BASE) MCG/ACT IN AERS: 2.0000 | INHALATION_SPRAY | Freq: Four times a day (QID) | RESPIRATORY_TRACT | 1 refills | Status: DC | PRN

## 2015-09-20 MED ORDER — ACETAMINOPHEN 500 MG PO TABS
500.0000 mg | ORAL_TABLET | Freq: Once | ORAL | Status: AC
  Administered 2015-09-20: 500 mg via ORAL

## 2015-09-20 MED ORDER — METAXALONE 800 MG PO TABS: 800.0000 mg | ORAL_TABLET | Freq: Four times a day (QID) | ORAL | 2 refills | Status: DC

## 2015-09-20 MED ORDER — PREDNISONE 20 MG PO TABS: ORAL_TABLET | ORAL | 0 refills | Status: DC
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
## 2015-09-20 NOTE — Patient Instructions (Addendum)
   IF you received an x-ray today, you will receive an invoice from  Radiology. Please contact  Radiology at [phone] with questions or concerns regarding your invoice.   IF you received labwork today, you will receive an invoice from [facility]. Please contact [facility] at [phone] with questions or concerns regarding your invoice.   Our billing staff will not be able to assist you with questions regarding bills from these companies.  You will be contacted with the lab results as soon as they are available. The fastest way to get your results is to activate your My Chart account. Instructions are located on the last page of this paperwork. If you have not heard from us regarding the results in 2 weeks, please contact this office.    Acute Bronchitis Bronchitis is inflammation of the airways that extend from the windpipe into the lungs (bronchi). The inflammation often causes mucus to develop. This leads to a cough, which is the most common symptom of bronchitis.  In acute bronchitis, the condition usually develops suddenly and goes away over time, usually in a couple weeks. Smoking, allergies, and asthma can make bronchitis worse. Repeated episodes of bronchitis may cause further lung problems.  CAUSES Acute bronchitis is most often caused by the same virus that causes a cold. The virus can spread from person to person (contagious) through coughing, sneezing, and touching contaminated objects. SIGNS AND SYMPTOMS   Cough.   Fever.   Coughing up mucus.   Body aches.   Chest congestion.   Chills.   Shortness of breath.   Sore throat.  DIAGNOSIS  Acute bronchitis is usually diagnosed through a physical exam. Your health care provider will also ask you questions about your medical history. Tests, such as chest X-rays, are sometimes done to rule out other conditions.  TREATMENT  Acute bronchitis usually goes away in a  couple weeks. Oftentimes, no medical treatment is necessary. Medicines are sometimes given for relief of fever or cough. Antibiotic medicines are usually not needed but may be prescribed in certain situations. In some cases, an inhaler may be recommended to help reduce shortness of breath and control the cough. A cool mist vaporizer may also be used to help thin bronchial secretions and make it easier to clear the chest.  HOME CARE INSTRUCTIONS  Get plenty of rest.   Drink enough fluids to keep your urine clear or pale yellow (unless you have a medical condition that requires fluid restriction). Increasing fluids may help thin your respiratory secretions (sputum) and reduce chest congestion, and it will prevent dehydration.   Take medicines only as directed by your health care provider.  If you were prescribed an antibiotic medicine, finish it all even if you start to feel better.  Avoid smoking and secondhand smoke. Exposure to cigarette smoke or irritating chemicals will make bronchitis worse. If you are a smoker, consider using nicotine gum or skin patches to help control withdrawal symptoms. Quitting smoking will help your lungs heal faster.   Reduce the chances of another bout of acute bronchitis by washing your hands frequently, avoiding people with cold symptoms, and trying not to touch your hands to your mouth, nose, or eyes.   Keep all follow-up visits as directed by your health care provider.  SEEK MEDICAL CARE IF: Your symptoms do not improve after 1 week of treatment.  SEEK IMMEDIATE MEDICAL CARE IF:  You develop an increased fever or chills.   You have chest pain.     You have severe shortness of breath.  You have bloody sputum.   You develop dehydration.  You faint or repeatedly feel like you are going to pass out.  You develop repeated vomiting.  You develop a severe headache. MAKE SURE YOU:   Understand these instructions.  Will watch your  condition.  Will get help right away if you are not doing well or get worse.   This information is not intended to replace advice given to you by your health care provider. Make sure you discuss any questions you have with your health care provider.   Document Released: 03/04/2004 Document Revised: 02/15/2014 Document Reviewed: 07/18/2012 Elsevier Interactive Patient Education 2016 Elsevier Inc.  

## 2015-10-15 ENCOUNTER — Ambulatory Visit (INDEPENDENT_AMBULATORY_CARE_PROVIDER_SITE_OTHER): Payer: Managed Care, Other (non HMO) | Admitting: Internal Medicine

## 2015-10-15 ENCOUNTER — Encounter: Payer: Self-pay | Admitting: Internal Medicine

## 2015-10-15 VITALS — BP 132/82 | HR 98 | Ht 65.0 in | Wt 226.0 lb

## 2015-10-15 DIAGNOSIS — E1010 Type 1 diabetes mellitus with ketoacidosis without coma: Secondary | ICD-10-CM | POA: Diagnosis not present

## 2015-10-15 LAB — POCT GLYCOSYLATED HEMOGLOBIN (HGB A1C): Hemoglobin A1C: 7

## 2015-10-15 MED ORDER — GLUCOSE BLOOD VI STRP: ORAL_STRIP | 99 refills | Status: DC

## 2015-10-15 MED ORDER — GLUCAGON (RDNA) 1 MG IJ KIT: 1.0000 mg | PACK | Freq: Once | INTRAMUSCULAR | 99 refills | Status: DC | PRN

## 2015-10-15 NOTE — Addendum Note (Signed)
Addended by: Caprice Beaver T on: 10/15/2015 01:07 PM   Modules accepted: Orders

## 2015-10-15 NOTE — Patient Instructions (Addendum)
Please change the following settings: - basal rates: 12 am: 3.5 units/h 4 am: 3.4 7 am: 2.5 12 pm: 1.75 - ICR:   12 am: 4  11 am: 4 >> 4.5  3 pm: 4   5 pm: 4 >> 3.5 (if you start vegan diet, this may need to be changed back to 4) - target: 100-120 - ISF:  12 am: 20 11 am: 20 >> 30 3 pm: 20 - Insulin on Board: 3h - bolus wizard: on  Please return in 3 months with your sugar log.   Basic Rules for Patients with Type I Diabetes Mellitus  1. The American Diabetes Association (ADA) recommended targets: - fasting sugar <130 - after meal sugar <180 - HbA1C <7%  2. Engage in ?150 min moderate exercise per week  3. Make sure you have ?8h of sleep every night as this helps both blood sugars and your weight.  4. Always keep a sugar log (not only record in your meter) and bring it to all appointments with [place].  5. If you are on a pump, know how to access the settings and to modify the parameters.  6.  Remember, you can always call the number on the back of the pump for emergencies related to the pump.  7. "15-15 rule" for hypoglycemia: if sugars are low, take 15 g of carbs** ("fast sugar" - e.g. 4 glucose tablets, 4 oz orange juice), wait 15 min, then check sugars again. If still <80, repeat. Continue  until your sugars >80, then eat a normal meal.   8. Teach family members and coworkers to inject glucagon. Have a glucagon set at home and one at work. They should call 911 after using the set.  9. If you are on a pump, set "insulin on board" time for 5 hours (if your sugars tend to be higher, can use 4 hours).   10. If you are on a pump, use the "dual wave bolus" setting for high fat foods (e.g. pizza). Start with a setting of 50%-50% (50% instant bolus and 50% prolonged bolus over 3h, for e.g.).    11. If you are on a pump, make sure the basal daily insulin dose is approximately equal (not larger) to the daily insulin you get from boluses, otherwise you are at risk for  hypoglycemia.  12. Check sugar before driving. If <100, correct, and only start driving if sugars rise ?100. Check sugar every hour when on a long drive.  13. Check sugar before exercising. If <100, correct, and only start exercising if sugars rise ?100. Check sugar every hour when on a long exercise routine and 1h after you finished exercising.   If >250, check urine for ketones. If you have moderate-large ketones in urine, do not start exercise. Hydrate yourself with clear liquids and correct the high sugar. Recheck sugars and ketones before attempting to exercise.  Be aware that you might need less insulin when exercising.  *intense, short, exercise bursts can increase your sugars, but  *less intense, longer (>1h), exercise routines can decrease your sugars.  If you are on a pump, you might need to decrease your basal rate by 10% or more (or even disconnect your pump) while you exercise to prevent low sugars. Do not disconnect your pump by more than 3 hours at a time! You also might need to decrease your insulin bolus for the meal prior to your exercise time by 20% or more.  14. Make sure you have a MedAlert  bracelet or pendant mentioning "Type I Diabetes Mellitus". If you have a prior episode of severe hypoglycemia or hypoglycemia unawareness, it should also mention this.  15. Please do not walk barefoot. Inspect your feet for sores/cuts and let us know if you have them.  16. Please call [facility] with any questions and concerns [phone]).   **E.g. of "fast carbs": ? first choice (15 g):  1 tube glucose gel, GlucoPouch 15, 2 oz glucose liquid ? second choice (15-16 g):  3 or 4 glucose tablets (best taken  with water), 15 Dextrose Bits chewable ? third choice (15-20 g):   cup fruit juice,  cup regular soda, 1 cup skim milk,  1 cup sports drink ? fourth choice (15-20 g):  1 small tube [place] gel (not frosting), 2 tbsp raisins, 1 tbsp table sugar,  candy,  jelly beans, gum drops - check package for carb amount   (adapted from: Lenice Pressman. "Insulin therapy and hypoglycemia" Endocrinol Metab Clin N Am 2012, 41: 57-87)
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
## 2015-11-17 ENCOUNTER — Ambulatory Visit (INDEPENDENT_AMBULATORY_CARE_PROVIDER_SITE_OTHER): Payer: Managed Care, Other (non HMO) | Admitting: Family Medicine

## 2015-11-17 VITALS — BP 128/80 | HR 98 | Temp 98.6°F | Resp 17 | Ht 65.5 in | Wt 229.0 lb

## 2015-11-17 DIAGNOSIS — M25561 Pain in right knee: Secondary | ICD-10-CM | POA: Diagnosis not present

## 2015-11-17 MED ORDER — MELOXICAM 15 MG PO TABS: 15.0000 mg | ORAL_TABLET | Freq: Every day | ORAL | 0 refills | Status: DC

## 2015-11-17 NOTE — Assessment & Plan Note (Signed)
Exam and history are not consistent with a specific cause. Possible for degenerative meniscal tear but exam and history don't fit. Possible for LCL sprain but has good endpoint. No suggestion of peroneal compression. No suggestion of IT band syndrome. Not likely for fracture with no incident. Has family history of RA and OA. No hx of gout or pseudogout.  - try mobic  - placed hinged knee brace  - advised to follow up. If no improvement then x-ray and consider referral for Ultrasound.

## 2015-11-17 NOTE — Patient Instructions (Addendum)
  Thank you for coming in,   I have sent in mobic which is an anti-inflammatory. Please do not take this with ibuprofen.   Please elevate and ice your knee.   If you don't have any improvement with your pain then please follow up with [place].    Please feel free to call with any questions or concerns at any time, at [phone]. --Dr. Raeford Razor    IF you received an x-ray today, you will receive an invoice from [facility]. Please contact [facility] at [phone] with questions or concerns regarding your invoice.   IF you received labwork today, you will receive an invoice from [employer]. Please contact [facility] at [phone] with questions or concerns regarding your invoice.   Our billing staff will not be able to assist you with questions regarding bills from these companies.  You will be contacted with the lab results as soon as they are available. The fastest way to get your results is to activate your My Chart account. Instructions are located on the last page of this paperwork. If you have not heard from [place] regarding the results in 2 weeks, please contact this office.

## 2015-11-17 NOTE — Progress Notes (Signed)
   Subjective:    Patient ID: Karla Rodriguez, female    DOB: 03-Oct-1965, 50 y.o.   MRN: [id number]  Chief Complaint  Patient presents with  . Knee Pain    right of unknown origin   . Immunizations    flu     PCP: Reginia Forts, MD  HPI  This is a 50 y.o. female who is presenting with right knee pain. She woke up this morning and was unable to walk on her right leg. Pain with extension. Worse with rising from a seated position. She denies any inciting event. She had a history of OA of the left knee. Denies any swelling. Hasn't taken any medications. She locates the pain to the lateral aspect of her knee. Has some radiation down her knee. Describes that it feels like her "caught" and it won't let go. This knee has never had this problem. Pain with walking.   ROS: No unexpected weight loss, fever, chills, swelling, instability, numbness/tingling, redness, otherwise see HPI   Review of Systems  PMH: bipolar, hypercholesterolemia, IBS, type 1 DM,   PShx: Left TKA PSx: tobacco user FHx: RA, OA     Objective:   Physical Exam BP 128/80 (BP Location: Right Arm, Patient Position: Sitting, Cuff Size: Normal)   Pulse 98   Temp 98.6 F (37 C) (Oral)   Resp 17   Ht 5' 5.5" (1.664 m)   Wt 229 lb (103.9 kg)   SpO2 99%   BMI 37.53 kg/m  Gen: NAD, alert, cooperative with exam, well-appearing Resp: non-labored Skin: no rashes, normal turgor  Neuro: no gross deficits.  Psych: alert and oriented Knee: Normal to inspection with no erythema or effusion or obvious bony abnormalities. Palpation normal with no warmth, joint line tenderness, patellar tenderness, or condyle tenderness. Normal Knee flexion but pain in the lateral compartment to extension  Pain on the lateral aspect when performing valgus stress testing.  No pain with varus stress testing. . Ligaments with solid consistent endpoints including  LCL, MCL. Negative Thessalonian tests. Non painful patellar compression. Patellar and  quadriceps tendons unremarkable. Hamstring and quadriceps strength is normal.  Negative Nobles test  Walking with limp      Assessment & Plan:   Acute pain of right knee Exam and history are not consistent with a specific cause. Possible for degenerative meniscal tear but exam and history don't fit. Possible for LCL sprain but has good endpoint. No suggestion of peroneal compression. No suggestion of IT band syndrome. Not likely for fracture with no incident. Has family history of RA and OA. No hx of gout or pseudogout.  - try mobic  - placed hinged knee brace  - advised to follow up. If no improvement then x-ray and consider referral for Ultrasound.

## 2015-11-18 NOTE — Progress Notes (Addendum)
Note reviewed, and agree with documentation and plan. Signed,   Merri Ray, MD [facility].  11/18/15 2:57 PM

## 2015-12-12 ENCOUNTER — Telehealth: Payer: Self-pay | Admitting: Gastroenterology

## 2015-12-19 NOTE — Telephone Encounter (Signed)
Never heard back from patient, sent letter that if she needs a refill to have the pharmacy call requesting this and Dr. Havery Moros will address it then.

## 2016-01-15 ENCOUNTER — Ambulatory Visit: Payer: Managed Care, Other (non HMO) | Admitting: Internal Medicine

## 2016-01-20 ENCOUNTER — Ambulatory Visit (INDEPENDENT_AMBULATORY_CARE_PROVIDER_SITE_OTHER): Payer: Managed Care, Other (non HMO) | Admitting: Internal Medicine

## 2016-01-20 ENCOUNTER — Encounter: Payer: Self-pay | Admitting: Internal Medicine

## 2016-01-20 VITALS — BP 132/84 | HR 92 | Ht 65.5 in | Wt 232.0 lb

## 2016-01-20 DIAGNOSIS — Z23 Encounter for immunization: Secondary | ICD-10-CM

## 2016-01-20 DIAGNOSIS — E1010 Type 1 diabetes mellitus with ketoacidosis without coma: Secondary | ICD-10-CM | POA: Diagnosis not present

## 2016-01-20 LAB — POCT GLYCOSYLATED HEMOGLOBIN (HGB A1C): Hemoglobin A1C: 6.6

## 2016-01-20 MED ORDER — METFORMIN HCL ER 500 MG PO TB24: 500.0000 mg | ORAL_TABLET | Freq: Every day | ORAL | 1 refills | Status: DC

## 2016-01-20 MED ORDER — INSULIN LISPRO 100 UNIT/ML ~~LOC~~ SOLN: SUBCUTANEOUS | 3 refills | Status: DC
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
## 2016-01-20 NOTE — Patient Instructions (Addendum)
Please start Metformin ER 500 mg with dinner for 4 days, then add 500 mg with b'fast.  Please continue the following settings: - basal rates: 12 am: 3.55 units/h 4 am: 3.45 7 am: 2.70 12 pm: 2.00 - ICR:   12 am: 4  11 am: 4.5  3 pm: 4   5 pm: 3.5- target: 100-120 - ISF:  12 am: 20 11 am: 30 3 pm: 20 - Insulin on Board: 3h - bolus wizard: on  Please return in 3 months with your sugar log.

## 2016-02-06 ENCOUNTER — Telehealth: Payer: Self-pay | Admitting: Internal Medicine

## 2016-02-06 NOTE — Telephone Encounter (Signed)
Pt is in need of this rx she is completely out

## 2016-02-06 NOTE — Telephone Encounter (Signed)
Patient need a new prescription  metFORMIN (GLUCOPHAGE-XR) 500 MG 24 hr tablet  For twice a day

## 2016-02-10 ENCOUNTER — Other Ambulatory Visit: Payer: Self-pay

## 2016-02-10 MED ORDER — METFORMIN HCL ER 500 MG PO TB24: ORAL_TABLET | ORAL | 1 refills | Status: DC

## 2016-02-10 NOTE — Telephone Encounter (Signed)
OK to send the Rx for Meformin XR for 2x a day.

## 2016-02-10 NOTE — Telephone Encounter (Signed)
Patient ask if you could send another script to her pharmacy stating Rx for metformin xr for 2x a day.

## 2016-02-10 NOTE — Telephone Encounter (Signed)
Metformin xr sent in for twice a day, per Dr.Gherghe

## 2016-03-04 ENCOUNTER — Ambulatory Visit (INDEPENDENT_AMBULATORY_CARE_PROVIDER_SITE_OTHER): Payer: Managed Care, Other (non HMO) | Admitting: Family Medicine

## 2016-03-04 VITALS — BP 106/70 | HR 98 | Temp 98.1°F | Resp 16 | Ht 64.0 in | Wt 218.8 lb

## 2016-03-04 DIAGNOSIS — R059 Cough, unspecified: Secondary | ICD-10-CM

## 2016-03-04 DIAGNOSIS — R05 Cough: Secondary | ICD-10-CM

## 2016-03-04 DIAGNOSIS — R0602 Shortness of breath: Secondary | ICD-10-CM | POA: Diagnosis not present

## 2016-03-04 DIAGNOSIS — R062 Wheezing: Secondary | ICD-10-CM | POA: Diagnosis not present

## 2016-03-04 MED ORDER — ALBUTEROL SULFATE (2.5 MG/3ML) 0.083% IN NEBU: 2.5000 mg | INHALATION_SOLUTION | Freq: Four times a day (QID) | RESPIRATORY_TRACT | 1 refills | Status: DC | PRN

## 2016-03-04 MED ORDER — PREDNISONE 20 MG PO TABS: 40.0000 mg | ORAL_TABLET | Freq: Every day | ORAL | 0 refills | Status: DC
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
## 2016-03-04 NOTE — Patient Instructions (Addendum)
Take Prednisone 40 mg with breakfast or lunch x 5 days.  Continue nebulizer treatments and or albuterol as needed.  IF you received an x-ray today, you will receive an invoice from [facility]. Please contact [facility] at [phone] with questions or concerns regarding your invoice.   IF you received labwork today, you will receive an invoice from [facility]. Please contact [facility] at [phone] with questions or concerns regarding your invoice.   Our billing staff will not be able to assist you with questions regarding bills from these companies.  You will be contacted with the lab results as soon as they are available. The fastest way to get your results is to activate your My Chart account. Instructions are located on the last page of this paperwork. If you have not heard from [place] regarding the results in 2 weeks, please contact this office.     Bronchospasm, Adult A bronchospasm is when the tubes that carry air in and out of your lungs (airways) spasm or tighten. During a bronchospasm it is hard to breathe. This is because the airways get smaller. A bronchospasm can be triggered by:  Allergies. These may be to animals, pollen, food, or mold.  Infection. This is a common cause of bronchospasm.  Exercise.  Irritants. These include pollution, cigarette smoke, strong odors, aerosol sprays, and paint fumes.  Weather changes.  Stress.  Being emotional. Follow these instructions at home:  Always have a plan for getting help. Know when to call your doctor and local emergency services (911 in the [place] Know where you can get emergency care.  Only take medicines as told by your doctor.  If you were prescribed an inhaler or nebulizer machine, ask your doctor how to use it correctly. Always use a spacer with your inhaler if you were given one.  Stay calm during an attack. Try to relax and breathe more slowly.  Control your home environment:  Change your  heating and air conditioning filter at least once a month.  Limit your use of fireplaces and wood stoves.  Do not  smoke. Do not  allow smoking in your home.  Avoid perfumes and fragrances.  Get rid of pests (such as roaches and mice) and their droppings.  Throw away plants if you see mold on them.  Keep your house clean and dust free.  Replace carpet with wood, tile, or vinyl flooring. Carpet can trap dander and dust.  Use allergy-proof pillows, mattress covers, and box spring covers.  Wash bed sheets and blankets every week in hot water. Dry them in a dryer.  Use blankets that are made of polyester or cotton.  Wash hands frequently. Contact a doctor if:  You have muscle aches.  You have chest pain.  The thick spit you spit or cough up (sputum) changes from clear or white to yellow, green, gray, or bloody.  The thick spit you spit or cough up gets thicker.  There are problems that may be related to the medicine you are given such as:  A rash.  Itching.  Swelling.  Trouble breathing. Get help right away if:  You feel you cannot breathe or catch your breath.  You cannot stop coughing.  Your treatment is not helping you breathe better.  You have very bad chest pain. This information is not intended to replace advice given to you by your health care provider. Make sure you discuss any questions you have with your health care provider. Document Released: 11/22/2008 Document Revised: 07/03/2015  Document Reviewed: 07/18/2012 Elsevier Interactive Patient Education  2017 [employer].

## 2016-03-06 ENCOUNTER — Ambulatory Visit (INDEPENDENT_AMBULATORY_CARE_PROVIDER_SITE_OTHER): Payer: Managed Care, Other (non HMO) | Admitting: Family Medicine

## 2016-03-06 VITALS — BP 126/76 | HR 102 | Temp 98.9°F | Resp 20 | Ht 64.0 in | Wt 221.0 lb

## 2016-03-06 DIAGNOSIS — E109 Type 1 diabetes mellitus without complications: Secondary | ICD-10-CM

## 2016-03-06 DIAGNOSIS — J22 Unspecified acute lower respiratory infection: Secondary | ICD-10-CM

## 2016-03-06 DIAGNOSIS — L603 Nail dystrophy: Secondary | ICD-10-CM

## 2016-03-06 DIAGNOSIS — J4541 Moderate persistent asthma with (acute) exacerbation: Secondary | ICD-10-CM | POA: Diagnosis not present

## 2016-03-06 DIAGNOSIS — J111 Influenza due to unidentified influenza virus with other respiratory manifestations: Secondary | ICD-10-CM | POA: Diagnosis not present

## 2016-03-06 LAB — POCT INFLUENZA A/B
INFLUENZA B, POC: NEGATIVE
Influenza A, POC: NEGATIVE

## 2016-03-06 MED ORDER — FLUTICASONE PROPIONATE 50 MCG/ACT NA SUSP
2.0000 | Freq: Every day | NASAL | 12 refills | Status: DC
Start: 2016-03-06 — End: 2017-01-12

## 2016-03-06 MED ORDER — OSELTAMIVIR PHOSPHATE 75 MG PO CAPS: 75.0000 mg | ORAL_CAPSULE | Freq: Two times a day (BID) | ORAL | 0 refills | Status: DC

## 2016-03-06 MED ORDER — AMOXICILLIN-POT CLAVULANATE 875-125 MG PO TABS: 1.0000 | ORAL_TABLET | Freq: Two times a day (BID) | ORAL | 0 refills | Status: DC

## 2016-03-06 MED ORDER — PREDNISONE 20 MG PO TABS: ORAL_TABLET | ORAL | 0 refills | Status: DC

## 2016-03-06 MED ORDER — TERBINAFINE HCL 250 MG PO TABS: 250.0000 mg | ORAL_TABLET | Freq: Every day | ORAL | 0 refills | Status: DC
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
## 2016-03-06 NOTE — Patient Instructions (Signed)
     IF you received an x-ray today, you will receive an invoice from [facility]. Please contact [facility] at [phone] with questions or concerns regarding your invoice.   IF you received labwork today, you will receive an invoice from [facility]. Please contact [facility] at [phone] with questions or concerns regarding your invoice.   Our billing staff will not be able to assist you with questions regarding bills from these companies.  You will be contacted with the lab results as soon as they are available. The fastest way to get your results is to activate your My Chart account. Instructions are located on the last page of this paperwork. If you have not heard from us regarding the results in 2 weeks, please contact this office.     

## 2016-03-07 LAB — CBC WITH DIFFERENTIAL/PLATELET
BASOS: 0 %
Basophils Absolute: 0 10*3/uL (ref 0.0–0.2)
EOS (ABSOLUTE): 0 10*3/uL (ref 0.0–0.4)
EOS: 0 %
HEMATOCRIT: 46.5 % (ref 34.0–46.6)
HEMOGLOBIN: 16 g/dL — AB (ref 11.1–15.9)
IMMATURE GRANS (ABS): 0 10*3/uL (ref 0.0–0.1)
IMMATURE GRANULOCYTES: 0 %
LYMPHS: 8 %
Lymphocytes Absolute: 0.9 10*3/uL (ref 0.7–3.1)
MCH: 31.1 pg (ref 26.6–33.0)
MCHC: 34.4 g/dL (ref 31.5–35.7)
MCV: 90 fL (ref 79–97)
Monocytes Absolute: 0.5 10*3/uL (ref 0.1–0.9)
Monocytes: 4 %
NEUTROS ABS: 10.1 10*3/uL — AB (ref 1.4–7.0)
NEUTROS PCT: 88 %
Platelets: 264 10*3/uL (ref 150–379)
RBC: 5.15 x10E6/uL (ref 3.77–5.28)
RDW: 13.8 % (ref 12.3–15.4)
WBC: 11.5 10*3/uL — ABNORMAL HIGH (ref 3.4–10.8)

## 2016-03-07 LAB — COMPREHENSIVE METABOLIC PANEL
ALBUMIN: 5.1 g/dL (ref 3.5–5.5)
ALT: 17 IU/L (ref 0–32)
AST: 18 IU/L (ref 0–40)
Albumin/Globulin Ratio: 2.3 — ABNORMAL HIGH (ref 1.2–2.2)
Alkaline Phosphatase: 101 IU/L (ref 39–117)
BILIRUBIN TOTAL: 0.2 mg/dL (ref 0.0–1.2)
BUN / CREAT RATIO: 16 (ref 9–23)
BUN: 13 mg/dL (ref 6–24)
CALCIUM: 10.1 mg/dL (ref 8.7–10.2)
CO2: 20 mmol/L (ref 18–29)
Chloride: 97 mmol/L (ref 96–106)
Creatinine, Ser: 0.82 mg/dL (ref 0.57–1.00)
GFR, EST AFRICAN AMERICAN: 98 mL/min/{1.73_m2} (ref >59)
GFR, EST NON AFRICAN AMERICAN: 85 mL/min/{1.73_m2} (ref >59)
GLOBULIN, TOTAL: 2.2 g/dL (ref 1.5–4.5)
Glucose: 110 mg/dL — ABNORMAL HIGH (ref 65–99)
POTASSIUM: 4.9 mmol/L (ref 3.5–5.2)
SODIUM: 141 mmol/L (ref 134–144)
TOTAL PROTEIN: 7.3 g/dL (ref 6.0–8.5)

## 2016-03-16 ENCOUNTER — Telehealth: Payer: Self-pay | Admitting: Gastroenterology

## 2016-03-16 NOTE — Telephone Encounter (Signed)
Patient states that 2 days ago started having severe reflux and substernal pain. Denies any SOB, arm numbness or jaw pain. She has tried OTC Tums, Alkaseltzer with no relief. She does take Protonix at night, states that this helps. Please advise.

## 2016-03-16 NOTE — Telephone Encounter (Signed)
[facility] patient Chart reviewed recent diagnosis of influenza and asthma flare treated with Tamiflu and steroids Can increase pantoprazole to 40 mg twice a day before meals to better control reflux symptoms Reflux precautions Given recent respiratory infection and illness would also have her notify primary care and she needs to be re-seen by primary care if not improving

## 2016-03-16 NOTE — Telephone Encounter (Signed)
Patient advised of Dr. Vena Rua recommendations and patient advised to contact PCP.

## 2016-03-17 ENCOUNTER — Telehealth: Payer: Self-pay

## 2016-03-17 ENCOUNTER — Encounter: Payer: Self-pay | Admitting: Family Medicine

## 2016-03-17 ENCOUNTER — Other Ambulatory Visit: Payer: Self-pay

## 2016-03-17 ENCOUNTER — Ambulatory Visit (INDEPENDENT_AMBULATORY_CARE_PROVIDER_SITE_OTHER): Payer: Managed Care, Other (non HMO) | Admitting: Family Medicine

## 2016-03-17 VITALS — BP 132/82 | HR 92 | Temp 98.2°F | Ht 64.0 in | Wt 221.0 lb

## 2016-03-17 DIAGNOSIS — K2100 Gastro-esophageal reflux disease with esophagitis, without bleeding: Secondary | ICD-10-CM

## 2016-03-17 DIAGNOSIS — E109 Type 1 diabetes mellitus without complications: Secondary | ICD-10-CM

## 2016-03-17 DIAGNOSIS — B370 Candidal stomatitis: Secondary | ICD-10-CM | POA: Diagnosis not present

## 2016-03-17 MED ORDER — LINACLOTIDE 145 MCG PO CAPS: ORAL_CAPSULE | ORAL | 3 refills | Status: DC

## 2016-03-17 MED ORDER — SUCRALFATE 1 GM/10ML PO SUSP: 1.0000 g | Freq: Three times a day (TID) | ORAL | 0 refills | Status: DC

## 2016-03-17 MED ORDER — NYSTATIN 100000 UNIT/ML MT SUSP: 5.0000 mL | Freq: Four times a day (QID) | OROMUCOSAL | 0 refills | Status: DC
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
## 2016-03-17 NOTE — Telephone Encounter (Signed)
Fax refill request from [facility] for Linzess 145 90 day supply with 4 refills. Rx sent

## 2016-03-17 NOTE — Patient Instructions (Addendum)
Protonix 40mg  -- one hour before breakfast alone. Carafate liquid - take 30 minutes before lunch and 30 minutes before supper. Zantac 75mg  -- 2 tablets at bedtime Nystatin - four times daily for thrush.     IF you received an x-ray today, you will receive an invoice from [facility]. Please contact [facility] at [phone] with questions or concerns regarding your invoice.   IF you received labwork today, you will receive an invoice from [facility]. Please contact [facility] at [phone] with questions or concerns regarding your invoice.   Our billing staff will not be able to assist you with questions regarding bills from these companies.  You will be contacted with the lab results as soon as they are available. The fastest way to get your results is to activate your My Chart account. Instructions are located on the last page of this paperwork. If you have not heard from [place] regarding the results in 2 weeks, please contact this office.

## 2016-03-18 LAB — CBC WITH DIFFERENTIAL/PLATELET
BASOS: 0 %
Basophils Absolute: 0 10*3/uL (ref 0.0–0.2)
EOS (ABSOLUTE): 0.1 10*3/uL (ref 0.0–0.4)
EOS: 1 %
HEMATOCRIT: 43.1 % (ref 34.0–46.6)
HEMOGLOBIN: 14.4 g/dL (ref 11.1–15.9)
Immature Grans (Abs): 0 10*3/uL (ref 0.0–0.1)
Immature Granulocytes: 0 %
LYMPHS ABS: 1.9 10*3/uL (ref 0.7–3.1)
Lymphs: 19 %
MCH: 30.4 pg (ref 26.6–33.0)
MCHC: 33.4 g/dL (ref 31.5–35.7)
MCV: 91 fL (ref 79–97)
MONOCYTES: 7 %
MONOS ABS: 0.7 10*3/uL (ref 0.1–0.9)
NEUTROS ABS: 7.5 10*3/uL — AB (ref 1.4–7.0)
Neutrophils: 73 %
Platelets: 259 10*3/uL (ref 150–379)
RBC: 4.74 x10E6/uL (ref 3.77–5.28)
RDW: 13.9 % (ref 12.3–15.4)
WBC: 10.3 10*3/uL (ref 3.4–10.8)

## 2016-03-18 LAB — COMPREHENSIVE METABOLIC PANEL
A/G RATIO: 2.4 — AB (ref 1.2–2.2)
ALK PHOS: 81 IU/L (ref 39–117)
ALT: 16 IU/L (ref 0–32)
AST: 13 IU/L (ref 0–40)
Albumin: 4.5 g/dL (ref 3.5–5.5)
BUN / CREAT RATIO: 8 — AB (ref 9–23)
BUN: 6 mg/dL (ref 6–24)
Bilirubin Total: 0.3 mg/dL (ref 0.0–1.2)
CO2: 25 mmol/L (ref 18–29)
CREATININE: 0.78 mg/dL (ref 0.57–1.00)
Calcium: 9.5 mg/dL (ref 8.7–10.2)
Chloride: 101 mmol/L (ref 96–106)
GFR calc Af Amer: 104 mL/min/{1.73_m2} (ref >59)
GFR calc non Af Amer: 90 mL/min/{1.73_m2} (ref >59)
GLOBULIN, TOTAL: 1.9 g/dL (ref 1.5–4.5)
Glucose: 186 mg/dL — ABNORMAL HIGH (ref 65–99)
Potassium: 4.5 mmol/L (ref 3.5–5.2)
SODIUM: 145 mmol/L — AB (ref 134–144)
Total Protein: 6.4 g/dL (ref 6.0–8.5)

## 2016-04-07 ENCOUNTER — Other Ambulatory Visit: Payer: Self-pay | Admitting: Gastroenterology

## 2016-04-09 ENCOUNTER — Other Ambulatory Visit: Payer: Self-pay

## 2016-04-09 MED ORDER — ONETOUCH VERIO VI STRP: ORAL_STRIP | 5 refills | Status: DC

## 2016-04-20 ENCOUNTER — Ambulatory Visit: Payer: Managed Care, Other (non HMO) | Admitting: Internal Medicine

## 2016-04-30 ENCOUNTER — Other Ambulatory Visit (INDEPENDENT_AMBULATORY_CARE_PROVIDER_SITE_OTHER): Payer: Managed Care, Other (non HMO)

## 2016-04-30 ENCOUNTER — Ambulatory Visit (INDEPENDENT_AMBULATORY_CARE_PROVIDER_SITE_OTHER): Payer: Managed Care, Other (non HMO) | Admitting: Internal Medicine

## 2016-04-30 ENCOUNTER — Encounter: Payer: Self-pay | Admitting: Internal Medicine

## 2016-04-30 VITALS — BP 122/82 | HR 89 | Ht 65.0 in | Wt 217.0 lb

## 2016-04-30 DIAGNOSIS — E1010 Type 1 diabetes mellitus with ketoacidosis without coma: Secondary | ICD-10-CM | POA: Diagnosis not present

## 2016-04-30 LAB — POCT GLYCOSYLATED HEMOGLOBIN (HGB A1C): HEMOGLOBIN A1C: 6.2

## 2016-04-30 LAB — MICROALBUMIN / CREATININE URINE RATIO
CREATININE, U: 31.1 mg/dL
MICROALB/CREAT RATIO: 2.3 mg/g (ref 0.0–30.0)

## 2016-04-30 LAB — LIPID PANEL
CHOLESTEROL: 264 mg/dL — AB (ref 0–200)
HDL: 46.4 mg/dL (ref >39.00)
LDL Cholesterol: 182 mg/dL — ABNORMAL HIGH (ref 0–99)
NonHDL: 217.75
Total CHOL/HDL Ratio: 6
Triglycerides: 179 mg/dL — ABNORMAL HIGH (ref 0.0–149.0)
VLDL: 35.8 mg/dL (ref 0.0–40.0)

## 2016-04-30 LAB — TSH: TSH: 2.58 u[IU]/mL (ref 0.35–4.50)

## 2016-04-30 MED ORDER — INSULIN LISPRO 100 UNIT/ML ~~LOC~~ SOLN: SUBCUTANEOUS | 3 refills | Status: DC

## 2016-04-30 NOTE — Patient Instructions (Addendum)
Please continue the following settings: - basal rates: 12 am: 2.7 units/h 11 am: 2.15  2 pm: 1.15 6 pm: 1.10 - ICR:   12 am: 4  7 am: 4 >> 3.5  11 am: 4.5  3 pm: 4   5 pm: 3.5 - target: 100-120 - ISF:  12 am: 20 11 am: 30 3 pm: 20 - Insulin on Board: 3h - bolus wizard: on  Also continue metformin ER 500 mg twice a day.  Please return in 3 months with your sugar log.
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
## 2016-06-01 ENCOUNTER — Ambulatory Visit (INDEPENDENT_AMBULATORY_CARE_PROVIDER_SITE_OTHER): Payer: Managed Care, Other (non HMO) | Admitting: Family Medicine

## 2016-06-01 ENCOUNTER — Encounter: Payer: Self-pay | Admitting: Family Medicine

## 2016-06-01 VITALS — BP 132/75 | HR 94 | Temp 98.9°F | Resp 16 | Ht 64.5 in | Wt 219.8 lb

## 2016-06-01 DIAGNOSIS — K581 Irritable bowel syndrome with constipation: Secondary | ICD-10-CM | POA: Diagnosis not present

## 2016-06-01 DIAGNOSIS — E109 Type 1 diabetes mellitus without complications: Secondary | ICD-10-CM | POA: Diagnosis not present

## 2016-06-01 DIAGNOSIS — I10 Essential (primary) hypertension: Secondary | ICD-10-CM | POA: Diagnosis not present

## 2016-06-01 DIAGNOSIS — J301 Allergic rhinitis due to pollen: Secondary | ICD-10-CM | POA: Diagnosis not present

## 2016-06-01 DIAGNOSIS — K5901 Slow transit constipation: Secondary | ICD-10-CM

## 2016-06-01 DIAGNOSIS — R42 Dizziness and giddiness: Secondary | ICD-10-CM | POA: Diagnosis not present

## 2016-06-01 DIAGNOSIS — F17200 Nicotine dependence, unspecified, uncomplicated: Secondary | ICD-10-CM

## 2016-06-01 DIAGNOSIS — R232 Flushing: Secondary | ICD-10-CM

## 2016-06-01 DIAGNOSIS — K219 Gastro-esophageal reflux disease without esophagitis: Secondary | ICD-10-CM

## 2016-06-01 DIAGNOSIS — E7800 Pure hypercholesterolemia, unspecified: Secondary | ICD-10-CM | POA: Diagnosis not present

## 2016-06-01 DIAGNOSIS — G43A0 Cyclical vomiting, not intractable: Secondary | ICD-10-CM

## 2016-06-01 DIAGNOSIS — F3176 Bipolar disorder, in full remission, most recent episode depressed: Secondary | ICD-10-CM

## 2016-06-01 DIAGNOSIS — R1115 Cyclical vomiting syndrome unrelated to migraine: Secondary | ICD-10-CM

## 2016-06-01 LAB — POCT URINALYSIS DIP (MANUAL ENTRY)
BILIRUBIN UA: NEGATIVE
BILIRUBIN UA: NEGATIVE mg/dL
Leukocytes, UA: NEGATIVE
NITRITE UA: NEGATIVE
Protein Ur, POC: NEGATIVE mg/dL
RBC UA: NEGATIVE
Spec Grav, UA: 1.015 (ref 1.010–1.025)
Urobilinogen, UA: 1 E.U./dL
pH, UA: 7 (ref 5.0–8.0)

## 2016-06-01 LAB — HM DIABETES EYE EXAM

## 2016-06-01 NOTE — Patient Instructions (Addendum)
MIRALAX PURGE:  1.  Take 4 Dulcolax tablets at once. 2.  Wait one hour 3.  Take 8 doses of Miralax (each dose in 8 ounces of water) over 3 hours. 4.  Results usually within 6 hours. 5. If no results in 24 hours, repeat.    SENAKOT-S  TWO TABLETS TWICE DAILY. CHECK SUGAR WHEN YOU ARE HAVING FLUSHING/HOT FLASHES. Call physical therapist for constipation.   IF you received an x-ray today, you will receive an invoice from [facility]. Please contact [facility] at [phone] with questions or concerns regarding your invoice.   IF you received labwork today, you will receive an invoice from [facility]. Please contact [facility] at [phone] with questions or concerns regarding your invoice.   Our billing staff will not be able to assist you with questions regarding bills from these companies.  You will be contacted with the lab results as soon as they are available. The fastest way to get your results is to activate your My Chart account. Instructions are located on the last page of this paperwork. If you have not heard from [place] regarding the results in 2 weeks, please contact this office.    How to Perform the Epley Maneuver The Epley maneuver is an exercise that relieves symptoms of vertigo. Vertigo is the feeling that you or your surroundings are moving when they are not. When you feel vertigo, you may feel like the room is spinning and have trouble walking. Dizziness is a little different than vertigo. When you are dizzy, you may feel unsteady or light-headed. You can do this maneuver at home whenever you have symptoms of vertigo. You can do it up to 3 times a day until your symptoms go away. Even though the Epley maneuver may relieve your vertigo for a few weeks, it is possible that your symptoms will return. This maneuver relieves vertigo, but it does not relieve dizziness. What are the risks? If it is done correctly, the Epley maneuver is considered safe. Sometimes  it can lead to dizziness or nausea that goes away after a short time. If you develop other symptoms, such as changes in vision, weakness, or numbness, stop doing the maneuver and call your health care provider. How to perform the Epley maneuver 1. Sit on the edge of a bed or table with your back straight and your legs extended or hanging over the edge of the bed or table. 2. Turn your head halfway toward the affected ear or side. 3. Lie backward quickly with your head turned until you are lying flat on your back. You may want to position a pillow under your shoulders. 4. Hold this position for 30 seconds. You may experience an attack of vertigo. This is normal. 5. Turn your head to the opposite direction until your unaffected ear is facing the floor. 6. Hold this position for 30 seconds. You may experience an attack of vertigo. This is normal. Hold this position until the vertigo stops. 7. Turn your whole body to the same side as your head. Hold for another 30 seconds. 8. Sit back up. You can repeat this exercise up to 3 times a day. Follow these instructions at home:  After doing the Epley maneuver, you can return to your normal activities.  Ask your health care provider if there is anything you should do at home to prevent vertigo. He or she may recommend that you:  Keep your head raised (elevated) with two or more pillows while you sleep.  Do  not sleep on the side of your affected ear.  Get up slowly from bed.  Avoid sudden movements during the day.  Avoid extreme head movement, like looking up or bending over. Contact a health care provider if:  Your vertigo gets worse.  You have other symptoms, including:  Nausea.  Vomiting.  Headache. Get help right away if:  You have vision changes.  You have a severe or worsening headache or neck pain.  You cannot stop vomiting.  You have new numbness or weakness in any part of your body. Summary  Vertigo is the feeling that  you or your surroundings are moving when they are not.  The Epley maneuver is an exercise that relieves symptoms of vertigo.  If the Epley maneuver is done correctly, it is considered safe. You can do it up to 3 times a day. This information is not intended to replace advice given to you by your health care provider. Make sure you discuss any questions you have with your health care provider. Document Released: 01/30/2013 Document Revised: 12/16/2015 Document Reviewed: 12/16/2015 Elsevier Interactive Patient Education  2017 [employer].
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
## 2016-06-02 ENCOUNTER — Other Ambulatory Visit: Payer: Self-pay

## 2016-06-02 ENCOUNTER — Telehealth: Payer: Self-pay | Admitting: Internal Medicine

## 2016-06-02 LAB — CBC WITH DIFFERENTIAL/PLATELET
Basophils Absolute: 0 10*3/uL (ref 0.0–0.2)
Basos: 0 %
EOS (ABSOLUTE): 0.2 10*3/uL (ref 0.0–0.4)
EOS: 2 %
Hematocrit: 44.1 % (ref 34.0–46.6)
Hemoglobin: 14.9 g/dL (ref 11.1–15.9)
IMMATURE GRANULOCYTES: 0 %
Immature Grans (Abs): 0 10*3/uL (ref 0.0–0.1)
Lymphocytes Absolute: 1.6 10*3/uL (ref 0.7–3.1)
Lymphs: 22 %
MCH: 30.9 pg (ref 26.6–33.0)
MCHC: 33.8 g/dL (ref 31.5–35.7)
MCV: 92 fL (ref 79–97)
MONOCYTES: 8 %
MONOS ABS: 0.6 10*3/uL (ref 0.1–0.9)
Neutrophils Absolute: 4.9 10*3/uL (ref 1.4–7.0)
Neutrophils: 68 %
Platelets: 303 10*3/uL (ref 150–379)
RBC: 4.82 x10E6/uL (ref 3.77–5.28)
RDW: 14.4 % (ref 12.3–15.4)
WBC: 7.3 10*3/uL (ref 3.4–10.8)

## 2016-06-02 LAB — COMPREHENSIVE METABOLIC PANEL
ALT: 35 IU/L — ABNORMAL HIGH (ref 0–32)
AST: 23 IU/L (ref 0–40)
Albumin/Globulin Ratio: 2.5 — ABNORMAL HIGH (ref 1.2–2.2)
Albumin: 4.8 g/dL (ref 3.5–5.5)
Alkaline Phosphatase: 108 IU/L (ref 39–117)
BUN/Creatinine Ratio: 15 (ref 9–23)
BUN: 7 mg/dL (ref 6–24)
Bilirubin Total: 0.2 mg/dL (ref 0.0–1.2)
CALCIUM: 9.7 mg/dL (ref 8.7–10.2)
CO2: 27 mmol/L (ref 18–29)
CREATININE: 0.48 mg/dL — AB (ref 0.57–1.00)
Chloride: 100 mmol/L (ref 96–106)
GFR calc Af Amer: 134 mL/min/{1.73_m2} (ref >59)
GFR, EST NON AFRICAN AMERICAN: 116 mL/min/{1.73_m2} (ref >59)
Globulin, Total: 1.9 g/dL (ref 1.5–4.5)
Glucose: 88 mg/dL (ref 65–99)
Potassium: 4.7 mmol/L (ref 3.5–5.2)
Sodium: 143 mmol/L (ref 134–144)
Total Protein: 6.7 g/dL (ref 6.0–8.5)

## 2016-06-02 LAB — FSH/LH
FSH: 59.4 m[IU]/mL
LH: 41 m[IU]/mL

## 2016-06-02 MED ORDER — INSULIN LISPRO 100 UNIT/ML ~~LOC~~ SOLN: SUBCUTANEOUS | 3 refills | Status: DC

## 2016-06-02 NOTE — Telephone Encounter (Signed)
Submitted

## 2016-06-02 NOTE — Telephone Encounter (Signed)
Pt called in and requested that her Humalog be resubmitted to [facility] for 90 days, it is cheaper that way.

## 2016-06-03 LAB — URINE CULTURE

## 2016-06-07 ENCOUNTER — Other Ambulatory Visit: Payer: Self-pay

## 2016-06-07 MED ORDER — METFORMIN HCL ER 500 MG PO TB24: ORAL_TABLET | ORAL | 1 refills | Status: DC

## 2016-06-21 ENCOUNTER — Telehealth: Payer: Self-pay | Admitting: Internal Medicine

## 2016-06-21 ENCOUNTER — Other Ambulatory Visit: Payer: Self-pay

## 2016-06-21 MED ORDER — INSULIN LISPRO 100 UNIT/ML ~~LOC~~ SOLN: SUBCUTANEOUS | 3 refills | Status: DC

## 2016-06-21 NOTE — Telephone Encounter (Signed)
Can we please call in the humalog for a 90 day scripts [facility] asap?

## 2016-06-21 NOTE — Telephone Encounter (Signed)
Submitted

## 2016-06-25 NOTE — Telephone Encounter (Signed)
Pt needs refills on the humalog to be [facility] they are telling the pt they have not received it

## 2016-06-28 ENCOUNTER — Other Ambulatory Visit: Payer: Self-pay

## 2016-06-28 MED ORDER — INSULIN LISPRO 100 UNIT/ML ~~LOC~~ SOLN: SUBCUTANEOUS | 3 refills | Status: DC

## 2016-06-28 NOTE — Telephone Encounter (Signed)
[facility].

## 2016-07-08 ENCOUNTER — Other Ambulatory Visit: Payer: Self-pay

## 2016-07-08 MED ORDER — INSULIN LISPRO 100 UNIT/ML ~~LOC~~ SOLN: SUBCUTANEOUS | 3 refills | Status: DC

## 2016-07-08 NOTE — Telephone Encounter (Signed)
Submitted RX advising of 12 boxes were needed.

## 2016-07-08 NOTE — Telephone Encounter (Signed)
Patient needs 90 day script for [facility]og [facility] with quantity of 12 boxes noted on the script otherwise she will be charged more for the 4 boxes a month.  She will be charged $225 for a month (4 boxes) vs $225 for 3 mos (12 boxes).  Thank you,  -LL

## 2016-07-13 ENCOUNTER — Encounter: Payer: Self-pay | Admitting: Family Medicine

## 2016-07-13 ENCOUNTER — Ambulatory Visit (INDEPENDENT_AMBULATORY_CARE_PROVIDER_SITE_OTHER): Payer: Managed Care, Other (non HMO) | Admitting: Family Medicine

## 2016-07-13 VITALS — BP 127/89 | HR 95 | Temp 98.1°F | Resp 18 | Ht 64.49 in | Wt 211.4 lb

## 2016-07-13 DIAGNOSIS — K219 Gastro-esophageal reflux disease without esophagitis: Secondary | ICD-10-CM | POA: Diagnosis not present

## 2016-07-13 DIAGNOSIS — E7800 Pure hypercholesterolemia, unspecified: Secondary | ICD-10-CM

## 2016-07-13 DIAGNOSIS — E109 Type 1 diabetes mellitus without complications: Secondary | ICD-10-CM

## 2016-07-13 DIAGNOSIS — K581 Irritable bowel syndrome with constipation: Secondary | ICD-10-CM | POA: Diagnosis not present

## 2016-07-13 DIAGNOSIS — K5901 Slow transit constipation: Secondary | ICD-10-CM

## 2016-07-13 DIAGNOSIS — I10 Essential (primary) hypertension: Secondary | ICD-10-CM | POA: Diagnosis not present

## 2016-07-13 DIAGNOSIS — Z780 Asymptomatic menopausal state: Secondary | ICD-10-CM

## 2016-07-13 DIAGNOSIS — R42 Dizziness and giddiness: Secondary | ICD-10-CM

## 2016-07-13 DIAGNOSIS — F3176 Bipolar disorder, in full remission, most recent episode depressed: Secondary | ICD-10-CM

## 2016-07-13 MED ORDER — LISINOPRIL 10 MG PO TABS: 10.0000 mg | ORAL_TABLET | Freq: Every day | ORAL | 3 refills | Status: DC

## 2016-07-13 MED ORDER — ROSUVASTATIN CALCIUM 20 MG PO TABS: 20.0000 mg | ORAL_TABLET | Freq: Every day | ORAL | 1 refills | Status: DC

## 2016-07-13 MED ORDER — ROSUVASTATIN CALCIUM 20 MG PO TABS: 20.0000 mg | ORAL_TABLET | Freq: Every day | ORAL | 3 refills | Status: DC

## 2016-07-13 NOTE — Patient Instructions (Signed)
     IF you received an x-ray today, you will receive an invoice from [facility]. Please contact [facility] at [phone] with questions or concerns regarding your invoice.   IF you received labwork today, you will receive an invoice from [facility]. Please contact [facility] at [phone] with questions or concerns regarding your invoice.   Our billing staff will not be able to assist you with questions regarding bills from these companies.  You will be contacted with the lab results as soon as they are available. The fastest way to get your results is to activate your My Chart account. Instructions are located on the last page of this paperwork. If you have not heard from us regarding the results in 2 weeks, please contact this office.     
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
## 2016-07-15 ENCOUNTER — Telehealth: Payer: Self-pay | Admitting: Gastroenterology

## 2016-07-15 ENCOUNTER — Ambulatory Visit (INDEPENDENT_AMBULATORY_CARE_PROVIDER_SITE_OTHER): Payer: Managed Care, Other (non HMO) | Admitting: Gastroenterology

## 2016-07-15 ENCOUNTER — Other Ambulatory Visit: Payer: Self-pay

## 2016-07-15 ENCOUNTER — Encounter: Payer: Self-pay | Admitting: Gastroenterology

## 2016-07-15 VITALS — BP 112/62 | HR 100 | Ht 64.0 in | Wt 210.0 lb

## 2016-07-15 DIAGNOSIS — M6289 Other specified disorders of muscle: Secondary | ICD-10-CM

## 2016-07-15 DIAGNOSIS — K5900 Constipation, unspecified: Secondary | ICD-10-CM

## 2016-07-15 DIAGNOSIS — K5909 Other constipation: Secondary | ICD-10-CM | POA: Diagnosis not present

## 2016-07-15 DIAGNOSIS — Z780 Asymptomatic menopausal state: Secondary | ICD-10-CM | POA: Insufficient documentation
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
## 2016-07-15 NOTE — Patient Instructions (Signed)
If you are age 49 or older, your body mass index should be between 23-30. Your Body mass index is 36.05 kg/m. If this is out of the aforementioned range listed, please consider follow up with your Primary Care Provider.  If you are age 45 or younger, your body mass index should be between 19-25. Your Body mass index is 36.05 kg/m. If this is out of the aformentioned range listed, please consider follow up with your Primary Care Provider.   You have been given samples of Trulance. Take one a day for 7 days. If this is helpful please call the office back for a prescription.  We will refer you to physical therapy for pelvic floor dysfunction. If you have not heard from their office in 1 week. Please call us back.  Thank you.

## 2016-07-26 NOTE — Telephone Encounter (Signed)
Trulance rebate card mailed to pt to help with cost.

## 2016-08-17 ENCOUNTER — Ambulatory Visit (INDEPENDENT_AMBULATORY_CARE_PROVIDER_SITE_OTHER): Payer: Managed Care, Other (non HMO) | Admitting: Internal Medicine

## 2016-08-17 ENCOUNTER — Encounter: Payer: Self-pay | Admitting: Family Medicine

## 2016-08-17 ENCOUNTER — Ambulatory Visit: Payer: Managed Care, Other (non HMO) | Admitting: Internal Medicine

## 2016-08-17 ENCOUNTER — Encounter: Payer: Self-pay | Admitting: Internal Medicine

## 2016-08-17 ENCOUNTER — Ambulatory Visit (INDEPENDENT_AMBULATORY_CARE_PROVIDER_SITE_OTHER): Payer: Managed Care, Other (non HMO) | Admitting: Family Medicine

## 2016-08-17 VITALS — BP 120/74 | HR 98 | Ht 65.0 in | Wt 214.0 lb

## 2016-08-17 VITALS — BP 122/81 | HR 95 | Temp 98.0°F | Resp 18 | Ht 65.35 in | Wt 213.0 lb

## 2016-08-17 DIAGNOSIS — J301 Allergic rhinitis due to pollen: Secondary | ICD-10-CM

## 2016-08-17 DIAGNOSIS — K5901 Slow transit constipation: Secondary | ICD-10-CM | POA: Diagnosis not present

## 2016-08-17 DIAGNOSIS — N951 Menopausal and female climacteric states: Secondary | ICD-10-CM

## 2016-08-17 DIAGNOSIS — J4541 Moderate persistent asthma with (acute) exacerbation: Secondary | ICD-10-CM

## 2016-08-17 DIAGNOSIS — E109 Type 1 diabetes mellitus without complications: Secondary | ICD-10-CM | POA: Diagnosis not present

## 2016-08-17 DIAGNOSIS — E1010 Type 1 diabetes mellitus with ketoacidosis without coma: Secondary | ICD-10-CM

## 2016-08-17 LAB — POCT GLYCOSYLATED HEMOGLOBIN (HGB A1C): HEMOGLOBIN A1C: 6.6

## 2016-08-17 MED ORDER — BENZONATATE 100 MG PO CAPS: 100.0000 mg | ORAL_CAPSULE | Freq: Three times a day (TID) | ORAL | 0 refills | Status: DC | PRN

## 2016-08-17 MED ORDER — AZITHROMYCIN 250 MG PO TABS: ORAL_TABLET | ORAL | 0 refills | Status: DC

## 2016-08-17 MED ORDER — PREDNISONE 20 MG PO TABS: ORAL_TABLET | ORAL | 0 refills | Status: DC

## 2016-08-17 MED ORDER — FLUCONAZOLE 150 MG PO TABS: 150.0000 mg | ORAL_TABLET | Freq: Once | ORAL | 0 refills | Status: AC

## 2016-08-17 MED ORDER — AMOXICILLIN-POT CLAVULANATE 875-125 MG PO TABS: 1.0000 | ORAL_TABLET | Freq: Two times a day (BID) | ORAL | 0 refills | Status: DC

## 2016-08-17 NOTE — Patient Instructions (Addendum)
Please change settings: - basal rates: 12 am: 2.55 4 am: 2.10 7 am: 1.10 >> 1.30 12 pm: 1.10 - ICR:   12 am: 4  7 am: 3.7  11 am: 4.0  2:30 pm: 3.3 3 pm: 4  5 pm: 3.5 - target: 100-120 - ISF:  12 am: 20 11 am: 30 >> 40 3 pm: 20 - Insulin on Board: 3h - bolus wizard: on  Also: - metformin ER 500 mg twice a day   Please return in 3 months with your sugar log.
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
## 2016-08-17 NOTE — Patient Instructions (Addendum)
  Increase albuterol four times daily.   IF you received an x-ray today, you will receive an invoice from [facility]. Please contact [facility] at [phone] with questions or concerns regarding your invoice.   IF you received labwork today, you will receive an invoice from [facility]. Please contact [facility] at [phone] with questions or concerns regarding your invoice.   Our billing staff will not be able to assist you with questions regarding bills from these companies.  You will be contacted with the lab results as soon as they are available. The fastest way to get your results is to activate your My Chart account. Instructions are located on the last page of this paperwork. If you have not heard from [place] regarding the results in 2 weeks, please contact this office.
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
## 2016-08-29 NOTE — Addendum Note (Signed)
Addended by: Wardell Honour on: 08/29/2016 12:22 PM   Modules accepted: Level of Service

## 2016-09-04 ENCOUNTER — Other Ambulatory Visit: Payer: Self-pay | Admitting: Family Medicine

## 2016-11-16 ENCOUNTER — Other Ambulatory Visit: Payer: Self-pay | Admitting: Internal Medicine

## 2016-11-18 ENCOUNTER — Ambulatory Visit: Payer: Managed Care, Other (non HMO) | Admitting: Internal Medicine

## 2017-01-06 ENCOUNTER — Ambulatory Visit: Payer: Managed Care, Other (non HMO) | Admitting: Internal Medicine

## 2017-01-06 ENCOUNTER — Encounter: Payer: Self-pay | Admitting: Internal Medicine

## 2017-01-06 VITALS — BP 132/80 | HR 97 | Wt 230.0 lb

## 2017-01-06 DIAGNOSIS — E1010 Type 1 diabetes mellitus with ketoacidosis without coma: Secondary | ICD-10-CM

## 2017-01-06 LAB — COMPLETE METABOLIC PANEL WITH GFR
AG RATIO: 2.1 (calc) (ref 1.0–2.5)
ALBUMIN MSPROF: 4.6 g/dL (ref 3.6–5.1)
ALKALINE PHOSPHATASE (APISO): 96 U/L (ref 33–115)
ALT: 15 U/L (ref 6–29)
AST: 15 U/L (ref 10–35)
BILIRUBIN TOTAL: 0.3 mg/dL (ref 0.2–1.2)
BUN: 14 mg/dL (ref 7–25)
CHLORIDE: 102 mmol/L (ref 98–110)
CO2: 28 mmol/L (ref 20–32)
Calcium: 9.8 mg/dL (ref 8.6–10.2)
Creat: 0.76 mg/dL (ref 0.50–1.10)
GFR, Est African American: 107 mL/min/{1.73_m2} (ref >=60)
GFR, Est Non African American: 92 mL/min/{1.73_m2} (ref >=60)
GLOBULIN: 2.2 g/dL (ref 1.9–3.7)
Glucose, Bld: 92 mg/dL (ref 65–99)
POTASSIUM: 4.6 mmol/L (ref 3.5–5.3)
SODIUM: 139 mmol/L (ref 135–146)
Total Protein: 6.8 g/dL (ref 6.1–8.1)

## 2017-01-06 LAB — LIPID PANEL
CHOL/HDL RATIO: 3
Cholesterol: 151 mg/dL (ref 0–200)
HDL: 49 mg/dL (ref >39.00)
LDL CALC: 64 mg/dL (ref 0–99)
NONHDL: 102.16
Triglycerides: 189 mg/dL — ABNORMAL HIGH (ref 0.0–149.0)
VLDL: 37.8 mg/dL (ref 0.0–40.0)

## 2017-01-06 LAB — MICROALBUMIN / CREATININE URINE RATIO
CREATININE, U: 26.6 mg/dL
Microalb Creat Ratio: 2.6 mg/g (ref 0.0–30.0)

## 2017-01-06 LAB — TSH: TSH: 1.95 u[IU]/mL (ref 0.35–4.50)

## 2017-01-06 LAB — POCT GLYCOSYLATED HEMOGLOBIN (HGB A1C): Hemoglobin A1C: 6.6

## 2017-01-06 NOTE — Patient Instructions (Addendum)
Please continue: - basal rates: 12 am: 2.75 4 am: 2.10 7 am: 1.30 12 pm: 1.10 - ICR:   12 am: 4  7 am: 3.7  11 am: 4.0  2:30 pm: 3.3 3 pm: 4  5 pm: 3.5 - target: 100-120 - ISF:  12 am: 20 11 am: 40 3 pm: 20 - Insulin on Board: 3h - bolus wizard: on  Please start the boluses 10-15 min before a meal.  Also: - Metformin ER 500 mg 2x a day   Please stop at the lab.  Please return in 3-4 months with your sugar log.
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
## 2017-01-12 ENCOUNTER — Ambulatory Visit (INDEPENDENT_AMBULATORY_CARE_PROVIDER_SITE_OTHER): Payer: Managed Care, Other (non HMO) | Admitting: Family Medicine

## 2017-01-12 ENCOUNTER — Other Ambulatory Visit: Payer: Self-pay

## 2017-01-12 ENCOUNTER — Encounter: Payer: Self-pay | Admitting: Family Medicine

## 2017-01-12 VITALS — BP 118/72 | HR 89 | Temp 98.0°F | Resp 16 | Ht 65.75 in | Wt 226.0 lb

## 2017-01-12 DIAGNOSIS — K219 Gastro-esophageal reflux disease without esophagitis: Secondary | ICD-10-CM | POA: Diagnosis not present

## 2017-01-12 DIAGNOSIS — F3176 Bipolar disorder, in full remission, most recent episode depressed: Secondary | ICD-10-CM | POA: Diagnosis not present

## 2017-01-12 DIAGNOSIS — Z23 Encounter for immunization: Secondary | ICD-10-CM

## 2017-01-12 DIAGNOSIS — Z0000 Encounter for general adult medical examination without abnormal findings: Secondary | ICD-10-CM

## 2017-01-12 DIAGNOSIS — E1010 Type 1 diabetes mellitus with ketoacidosis without coma: Secondary | ICD-10-CM

## 2017-01-12 DIAGNOSIS — F1021 Alcohol dependence, in remission: Secondary | ICD-10-CM

## 2017-01-12 DIAGNOSIS — K5901 Slow transit constipation: Secondary | ICD-10-CM

## 2017-01-12 DIAGNOSIS — F17200 Nicotine dependence, unspecified, uncomplicated: Secondary | ICD-10-CM | POA: Diagnosis not present

## 2017-01-12 DIAGNOSIS — K581 Irritable bowel syndrome with constipation: Secondary | ICD-10-CM

## 2017-01-12 DIAGNOSIS — E66812 Obesity, class 2: Secondary | ICD-10-CM

## 2017-01-12 DIAGNOSIS — I10 Essential (primary) hypertension: Secondary | ICD-10-CM | POA: Diagnosis not present

## 2017-01-12 DIAGNOSIS — Z780 Asymptomatic menopausal state: Secondary | ICD-10-CM | POA: Diagnosis not present

## 2017-01-12 DIAGNOSIS — E7800 Pure hypercholesterolemia, unspecified: Secondary | ICD-10-CM

## 2017-01-12 DIAGNOSIS — J301 Allergic rhinitis due to pollen: Secondary | ICD-10-CM | POA: Diagnosis not present

## 2017-01-12 DIAGNOSIS — G4733 Obstructive sleep apnea (adult) (pediatric): Secondary | ICD-10-CM

## 2017-01-12 DIAGNOSIS — Z6836 Body mass index (BMI) 36.0-36.9, adult: Secondary | ICD-10-CM

## 2017-01-12 LAB — POCT URINALYSIS DIP (MANUAL ENTRY)
BILIRUBIN UA: NEGATIVE
GLUCOSE UA: NEGATIVE mg/dL
Ketones, POC UA: NEGATIVE mg/dL
Leukocytes, UA: NEGATIVE
Nitrite, UA: NEGATIVE
PH UA: 7 (ref 5.0–8.0)
Protein Ur, POC: NEGATIVE mg/dL
RBC UA: NEGATIVE
SPEC GRAV UA: 1.015 (ref 1.010–1.025)
UROBILINOGEN UA: 0.2 U/dL

## 2017-01-12 MED ORDER — ALBUTEROL SULFATE HFA 108 (90 BASE) MCG/ACT IN AERS: 2.0000 | INHALATION_SPRAY | Freq: Four times a day (QID) | RESPIRATORY_TRACT | 1 refills | Status: DC | PRN

## 2017-01-12 MED ORDER — FLUTICASONE PROPIONATE 50 MCG/ACT NA SUSP: 2.0000 | Freq: Every day | NASAL | 12 refills | Status: DC

## 2017-01-12 NOTE — Patient Instructions (Addendum)
IF you received an x-ray today, you will receive an invoice from [facility]. Please contact [facility] at [phone] with questions or concerns regarding your invoice.   IF you received labwork today, you will receive an invoice from [facility]. Please contact [facility] at [phone] with questions or concerns regarding your invoice.   Our billing staff will not be able to assist you with questions regarding bills from these companies.  You will be contacted with the lab results as soon as they are available. The fastest way to get your results is to activate your My Chart account. Instructions are located on the last page of this paperwork. If you have not heard from [place] regarding the results in 2 weeks, please contact this office.      Preventive Care 40-64 Years, Female Preventive care refers to lifestyle choices and visits with your health care provider that can promote health and wellness. What does preventive care include?  A yearly physical exam. This is also called an annual well check.  Dental exams once or twice a year.  Routine eye exams. Ask your health care provider how often you should have your eyes checked.  Personal lifestyle choices, including: ? Daily care of your teeth and gums. ? Regular physical activity. ? Eating a healthy diet. ? Avoiding tobacco and drug use. ? Limiting alcohol use. ? Practicing safe sex. ? Taking low-dose aspirin daily starting at age 34. ? Taking vitamin and mineral supplements as recommended by your health care provider. What happens during an annual well check? The services and screenings done by your health care provider during your annual well check will depend on your age, overall health, lifestyle risk factors, and family history of disease. Counseling Your health care provider may ask you questions about your:  Alcohol use.  Tobacco use.  Drug use.  Emotional well-being.  Home and relationship  well-being.  Sexual activity.  Eating habits.  Work and work Statistician.  Method of birth control.  Menstrual cycle.  Pregnancy history.  Screening You may have the following tests or measurements:  Height, weight, and BMI.  Blood pressure.  Lipid and cholesterol levels. These may be checked every 5 years, or more frequently if you are over 65 years old.  Skin check.  Lung cancer screening. You may have this screening every year starting at age 102 if you have a 30-pack-year history of smoking and currently smoke or have quit within the past 15 years.  Fecal occult blood test (FOBT) of the stool. You may have this test every year starting at age 33.  Flexible sigmoidoscopy or colonoscopy. You may have a sigmoidoscopy every 5 years or a colonoscopy every 10 years starting at age 23.  Hepatitis C blood test.  Hepatitis B blood test.  Sexually transmitted disease (STD) testing.  Diabetes screening. This is done by checking your blood sugar (glucose) after you have not eaten for a while (fasting). You may have this done every 1-3 years.  Mammogram. This may be done every 1-2 years. Talk to your health care provider about when you should start having regular mammograms. This may depend on whether you have a family history of breast cancer.  BRCA-related cancer screening. This may be done if you have a family history of breast, ovarian, tubal, or peritoneal cancers.  Pelvic exam and Pap test. This may be done every 3 years starting at age 3. Starting at age 55, this may be done every 5 years if  you have a Pap test in combination with an HPV test.  Bone density scan. This is done to screen for osteoporosis. You may have this scan if you are at high risk for osteoporosis.  Discuss your test results, treatment options, and if necessary, the need for more tests with your health care provider. Vaccines Your health care provider may recommend certain vaccines, such  as:  Influenza vaccine. This is recommended every year.  Tetanus, diphtheria, and acellular pertussis (Tdap, Td) vaccine. You may need a Td booster every 10 years.  Varicella vaccine. You may need this if you have not been vaccinated.  Zoster vaccine. You may need this after age 70.  Measles, mumps, and rubella (MMR) vaccine. You may need at least one dose of MMR if you were born in 1957 or later. You may also need a second dose.  Pneumococcal 13-valent conjugate (PCV13) vaccine. You may need this if you have certain conditions and were not previously vaccinated.  Pneumococcal polysaccharide (PPSV23) vaccine. You may need one or two doses if you smoke cigarettes or if you have certain conditions.  Meningococcal vaccine. You may need this if you have certain conditions.  Hepatitis A vaccine. You may need this if you have certain conditions or if you travel or work in places where you may be exposed to hepatitis A.  Hepatitis B vaccine. You may need this if you have certain conditions or if you travel or work in places where you may be exposed to hepatitis B.  Haemophilus influenzae type b (Hib) vaccine. You may need this if you have certain conditions.  Talk to your health care provider about which screenings and vaccines you need and how often you need them. This information is not intended to replace advice given to you by your health care provider. Make sure you discuss any questions you have with your health care provider. Document Released: 02/21/2015 Document Revised: 10/15/2015 Document Reviewed: 11/26/2014 Elsevier Interactive Patient Education  2017 [employer].
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
## 2017-01-17 DIAGNOSIS — Z6836 Body mass index (BMI) 36.0-36.9, adult: Secondary | ICD-10-CM

## 2017-01-17 MED ORDER — PANTOPRAZOLE SODIUM 40 MG PO TBEC: 40.0000 mg | DELAYED_RELEASE_TABLET | Freq: Every day | ORAL | 3 refills | Status: DC

## 2017-01-17 MED ORDER — MONTELUKAST SODIUM 10 MG PO TABS: 10.0000 mg | ORAL_TABLET | Freq: Every day | ORAL | 3 refills | Status: DC

## 2017-03-12 ENCOUNTER — Ambulatory Visit (INDEPENDENT_AMBULATORY_CARE_PROVIDER_SITE_OTHER): Payer: Managed Care, Other (non HMO) | Admitting: Family Medicine

## 2017-03-12 ENCOUNTER — Encounter: Payer: Self-pay | Admitting: Family Medicine

## 2017-03-12 VITALS — BP 120/76 | HR 97 | Temp 98.0°F | Resp 16 | Ht 65.75 in | Wt 225.0 lb

## 2017-03-12 DIAGNOSIS — F99 Mental disorder, not otherwise specified: Secondary | ICD-10-CM

## 2017-03-12 DIAGNOSIS — G4733 Obstructive sleep apnea (adult) (pediatric): Secondary | ICD-10-CM | POA: Diagnosis not present

## 2017-03-12 DIAGNOSIS — R232 Flushing: Secondary | ICD-10-CM

## 2017-03-12 DIAGNOSIS — F1021 Alcohol dependence, in remission: Secondary | ICD-10-CM

## 2017-03-12 DIAGNOSIS — F3178 Bipolar disorder, in full remission, most recent episode mixed: Secondary | ICD-10-CM

## 2017-03-12 DIAGNOSIS — F5105 Insomnia due to other mental disorder: Secondary | ICD-10-CM

## 2017-03-12 MED ORDER — MODAFINIL 100 MG PO TABS: 100.0000 mg | ORAL_TABLET | Freq: Every day | ORAL | 0 refills | Status: DC

## 2017-03-12 NOTE — Patient Instructions (Addendum)
Effexor helps with hot flashes.  Discuss with your psychiatrist.    IF you received an x-ray today, you will receive an invoice from [facility]. Please contact [facility] at [phone] with questions or concerns regarding your invoice.   IF you received labwork today, you will receive an invoice from [facility]. Please contact [facility] at [phone] with questions or concerns regarding your invoice.   Our billing staff will not be able to assist you with questions regarding bills from these companies.  You will be contacted with the lab results as soon as they are available. The fastest way to get your results is to activate your My Chart account. Instructions are located on the last page of this paperwork. If you have not heard from [place] regarding the results in 2 weeks, please contact this office.    Menopause Menopause is the normal time of life when menstrual periods stop completely. Menopause is complete when you have missed 12 consecutive menstrual periods. It usually occurs between the ages of 15 years and 66 years. Very rarely does a woman develop menopause before the age of 13 years. At menopause, your ovaries stop producing the female hormones estrogen and progesterone. This can cause undesirable symptoms and also affect your health. Sometimes the symptoms may occur 4-5 years before the menopause begins. There is no relationship between menopause and:  Oral contraceptives.  Number of children you had.  Race.  The age your menstrual periods started (menarche).  Heavy smokers and very thin women may develop menopause earlier in life. What are the causes?  The ovaries stop producing the female hormones estrogen and progesterone. Other causes include:  Surgery to remove both ovaries.  The ovaries stop functioning for no known reason.  Tumors of the pituitary gland in the brain.  Medical disease that affects the ovaries and hormone  production.  Radiation treatment to the abdomen or pelvis.  Chemotherapy that affects the ovaries.  What are the signs or symptoms?  Hot flashes.  Night sweats.  Decrease in sex drive.  Vaginal dryness and thinning of the vagina causing painful intercourse.  Dryness of the skin and developing wrinkles.  Headaches.  Tiredness.  Irritability.  Memory problems.  Weight gain.  Bladder infections.  Hair growth of the face and chest.  Infertility. More serious symptoms include:  Loss of bone (osteoporosis) causing breaks (fractures).  Depression.  Hardening and narrowing of the arteries (atherosclerosis) causing heart attacks and strokes.  How is this diagnosed?  When the menstrual periods have stopped for 12 straight months.  Physical exam.  Hormone studies of the blood. How is this treated? There are many treatment choices and nearly as many questions about them. The decisions to treat or not to treat menopausal changes is an individual choice made with your health care provider. Your health care provider can discuss the treatments with you. Together, you can decide which treatment will work best for you. Your treatment choices may include:  Hormone therapy (estrogen and progesterone).  Non-hormonal medicines.  Treating the individual symptoms with medicine (for example antidepressants for depression).  Herbal medicines that may help specific symptoms.  Counseling by a psychiatrist or psychologist.  Group therapy.  Lifestyle changes including: ? Eating healthy. ? Regular exercise. ? Limiting caffeine and alcohol. ? Stress management and meditation.  No treatment.  Follow these instructions at home:  Take the medicine your health care provider gives you as directed.  Get plenty of sleep and rest.  Exercise regularly.  Eat  a diet that contains calcium (good for the bones) and soy products (acts like estrogen hormone).  Avoid alcoholic  beverages.  Do not smoke.  If you have hot flashes, dress in layers.  Take supplements, calcium, and vitamin D to strengthen bones.  You can use over-the-counter lubricants or moisturizers for vaginal dryness.  Group therapy is sometimes very helpful.  Acupuncture may be helpful in some cases. Contact a health care provider if:  You are not sure you are in menopause.  You are having menopausal symptoms and need advice and treatment.  You are still having menstrual periods after age 65 years.  You have pain with intercourse.  Menopause is complete (no menstrual period for 12 months) and you develop vaginal bleeding.  You need a referral to a specialist (gynecologist, psychiatrist, or psychologist) for treatment. Get help right away if:  You have severe depression.  You have excessive vaginal bleeding.  You fell and think you have a broken bone.  You have pain when you urinate.  You develop leg or chest pain.  You have a fast pounding heart beat (palpitations).  You have severe headaches.  You develop vision problems.  You feel a lump in your breast.  You have abdominal pain or severe indigestion. This information is not intended to replace advice given to you by your health care provider. Make sure you discuss any questions you have with your health care provider. Document Released: 04/17/2003 Document Revised: 07/03/2015 Document Reviewed: 08/24/2012 Elsevier Interactive Patient Education  2017 [employer].
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
## 2017-03-14 ENCOUNTER — Telehealth: Payer: Self-pay

## 2017-03-14 NOTE — Telephone Encounter (Signed)
Received PA from [employer] today stating that Modafinil is approved from 02/12/2017-03/14/2018.  Patient aware.

## 2017-03-30 ENCOUNTER — Other Ambulatory Visit: Payer: Self-pay

## 2017-03-30 MED ORDER — ALBUTEROL SULFATE HFA 108 (90 BASE) MCG/ACT IN AERS: 2.0000 | INHALATION_SPRAY | Freq: Four times a day (QID) | RESPIRATORY_TRACT | 3 refills | Status: DC | PRN

## 2017-03-30 MED ORDER — FLUTICASONE PROPIONATE 50 MCG/ACT NA SUSP: 2.0000 | Freq: Every day | NASAL | 3 refills | Status: DC

## 2017-04-04 ENCOUNTER — Encounter: Payer: Self-pay | Admitting: Neurology

## 2017-04-05 ENCOUNTER — Ambulatory Visit: Payer: Managed Care, Other (non HMO) | Admitting: Neurology

## 2017-04-05 ENCOUNTER — Encounter: Payer: Self-pay | Admitting: Neurology

## 2017-04-05 VITALS — BP 127/84 | HR 83 | Ht 64.0 in | Wt 278.0 lb

## 2017-04-05 DIAGNOSIS — G4733 Obstructive sleep apnea (adult) (pediatric): Secondary | ICD-10-CM | POA: Diagnosis not present

## 2017-04-05 DIAGNOSIS — Z9989 Dependence on other enabling machines and devices: Secondary | ICD-10-CM

## 2017-04-05 NOTE — Patient Instructions (Addendum)
You are fully compliant with CPAP. Please continue using your CPAP regularly. While your insurance may require that you use CPAP at least 4 hours each night on 70% of the nights, I recommend, that you not skip any nights and use it throughout the night if you can. Getting used to CPAP and staying with the treatment long term does take time and patience and discipline. Untreated obstructive sleep apnea when it is moderate to severe can have an adverse impact on cardiovascular health and raise her risk for heart disease, arrhythmias, hypertension, congestive heart failure, stroke and diabetes. Untreated obstructive sleep apnea causes sleep disruption, nonrestorative sleep, and sleep deprivation. This can have an impact on your day to day functioning and cause daytime sleepiness and impairment of cognitive function, memory loss, mood disturbance, and problems focussing. Using CPAP regularly can improve these symptoms.  You daytime sluggishness, or sleepiness may be in part related to medication side effects including from trazodone, Lamictal and Seroquel.  Please do not drive when you feel sleepy. Please try to quit smoking and continue to work on weight management.  I will prescribe a new CPAP machine, we will set the pressure at 10 cm like your old machine, you can use a fullface mask but may benefit from a mask refit, may want to consider a different type of full face mask. I would recommend that you use the humidifier in the CPAP machine.
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
## 2017-04-11 ENCOUNTER — Telehealth: Payer: Self-pay | Admitting: Nutrition

## 2017-04-11 NOTE — Telephone Encounter (Signed)
Message left on machine that I am available to start Guardian sensor on Wend. At 9;30 or 10:30.

## 2017-04-12 ENCOUNTER — Ambulatory Visit: Payer: Managed Care, Other (non HMO) | Admitting: Internal Medicine

## 2017-04-12 DIAGNOSIS — Z0289 Encounter for other administrative examinations: Secondary | ICD-10-CM

## 2017-04-13 ENCOUNTER — Encounter: Payer: Managed Care, Other (non HMO) | Attending: Internal Medicine | Admitting: Nutrition

## 2017-04-13 DIAGNOSIS — E1010 Type 1 diabetes mellitus with ketoacidosis without coma: Secondary | ICD-10-CM

## 2017-04-13 NOTE — Progress Notes (Addendum)
Patient was trained on the CGM.  We discussed the difference between sensor readings and blood sugar reading, and she reported good understanding.  She inserted a senor into her upper outer left arm and attached the transmitter.  We discussed the need to/and how to calibrate the sensor, she re demonstrated this correctly.  She had no final questions. She was told to return in 2 weeks to start the auto mode, and to review the manual before coming in.  She agreed to do this.  Pump downloaded and shown to Dr. Cruzita Lederer.  No changes needed at this time. Download showed patient is not filling cannula, after inserting a new sensor.  We reviewed how to do this, and 0.5u was put in for fill amount.

## 2017-04-13 NOTE — Patient Instructions (Signed)
Calibrate the sensor when pump says to do so, and then calibrate again in the next 4 hours. Then calibrate every 8 hours, making sure you do this before bed.   Call [phone] number if questions.

## 2017-04-14 ENCOUNTER — Telehealth: Payer: Self-pay | Admitting: Internal Medicine

## 2017-04-14 MED ORDER — INSULIN LISPRO 100 UNIT/ML ~~LOC~~ SOLN: SUBCUTANEOUS | 0 refills | Status: DC

## 2017-04-14 NOTE — Telephone Encounter (Signed)
Called pt to inform sent Rx as requested. No answer.

## 2017-04-14 NOTE — Telephone Encounter (Signed)
insulin lispro (HUMALOG) 100 UNIT/ML injection    [id number] [place], [place] - [address]   Patient states she needs an emergency prescription sent over. She has been waiting for her [facility] and they have not be delivered yet, patient is out and needs this sent over asap   Please advise  Thanks!

## 2017-04-15 ENCOUNTER — Ambulatory Visit: Payer: Managed Care, Other (non HMO) | Admitting: Family Medicine

## 2017-05-04 ENCOUNTER — Encounter: Payer: Managed Care, Other (non HMO) | Admitting: Nutrition

## 2017-05-04 DIAGNOSIS — E1010 Type 1 diabetes mellitus with ketoacidosis without coma: Secondary | ICD-10-CM

## 2017-05-04 NOTE — Progress Notes (Signed)
Pt. Was trained on the auto mode via the sllde presentation.  She put her pump into auto mode without difficulty and signed off the checklist of topics covered,  as understanding all topic,  She is not connected to Care link, and I told her to do this tonight, or she will need to come back in 1 week for download.  She agreed to do this, and to call me with her user name and password.

## 2017-05-04 NOTE — Patient Instructions (Signed)
Sign up for Care link, and call me with your user name and password. Call [employer] number if questions or problems

## 2017-05-10 ENCOUNTER — Telehealth: Payer: Self-pay | Admitting: Nutrition

## 2017-05-10 NOTE — Telephone Encounter (Signed)
Message left on machine to call me to let me know how she is doing in the auto mode.

## 2017-05-12 ENCOUNTER — Other Ambulatory Visit: Payer: Self-pay | Admitting: Internal Medicine

## 2017-05-15 ENCOUNTER — Other Ambulatory Visit: Payer: Self-pay | Admitting: Internal Medicine

## 2017-06-10 ENCOUNTER — Other Ambulatory Visit: Payer: Self-pay | Admitting: Internal Medicine

## 2017-06-16 ENCOUNTER — Other Ambulatory Visit: Payer: Self-pay | Admitting: Family Medicine

## 2017-06-30 ENCOUNTER — Encounter: Payer: Self-pay | Admitting: Family Medicine

## 2017-07-03 ENCOUNTER — Other Ambulatory Visit: Payer: Self-pay | Admitting: Family Medicine

## 2017-07-06 ENCOUNTER — Encounter: Payer: Self-pay | Admitting: Internal Medicine

## 2017-07-06 ENCOUNTER — Ambulatory Visit: Payer: Managed Care, Other (non HMO) | Admitting: Internal Medicine

## 2017-07-06 VITALS — BP 128/90 | HR 105 | Ht 64.5 in | Wt 222.0 lb

## 2017-07-06 DIAGNOSIS — E1010 Type 1 diabetes mellitus with ketoacidosis without coma: Secondary | ICD-10-CM

## 2017-07-06 LAB — POCT GLYCOSYLATED HEMOGLOBIN (HGB A1C): HEMOGLOBIN A1C: 7.2 % — AB (ref 4.0–5.6)

## 2017-07-06 MED ORDER — GLUCAGON (RDNA) 1 MG IJ KIT: 1.0000 mg | PACK | Freq: Once | INTRAMUSCULAR | 12 refills | Status: DC | PRN

## 2017-07-06 NOTE — Patient Instructions (Addendum)
Please change:: - basal rates: 12 am: 2.75 >> 2.5 4 am: 2.35 7 am: 0.925 12 pm: 1.05 - ICR:   12 am: 4  7 am: 3.8  11 am: 4.0  3:30 pm: 4.0 3 pm: 4  5 pm: 3.6 - target: 100-120 - ISF:  12 am: 20 11 am: 30 >> 40 3 pm: 20 - Insulin on Board: 3h - bolus wizard: on  Please raise the target to 150 when starting to work outside or exercise.  Try to enter ~50% of the protein amount in meals as carbs (especially with dinner)  Please do the following approximately 15 minutes before every meal: - Enter carbs (C) - Enter sugars (S) - Start insulin bolus (I)  Also: - Metformin ER 500 mg 2x a day   Please return in 3-4 months with your sugar log.
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
## 2017-07-10 ENCOUNTER — Other Ambulatory Visit: Payer: Self-pay | Admitting: Family Medicine

## 2017-07-13 ENCOUNTER — Ambulatory Visit: Payer: Managed Care, Other (non HMO) | Admitting: Family Medicine

## 2017-07-13 ENCOUNTER — Other Ambulatory Visit: Payer: Self-pay

## 2017-07-13 ENCOUNTER — Encounter: Payer: Self-pay | Admitting: Family Medicine

## 2017-07-13 VITALS — BP 112/68 | HR 91 | Temp 98.0°F | Resp 16 | Ht 64.96 in | Wt 225.0 lb

## 2017-07-13 DIAGNOSIS — F5105 Insomnia due to other mental disorder: Secondary | ICD-10-CM

## 2017-07-13 DIAGNOSIS — R232 Flushing: Secondary | ICD-10-CM | POA: Diagnosis not present

## 2017-07-13 DIAGNOSIS — G4733 Obstructive sleep apnea (adult) (pediatric): Secondary | ICD-10-CM | POA: Diagnosis not present

## 2017-07-13 DIAGNOSIS — E1010 Type 1 diabetes mellitus with ketoacidosis without coma: Secondary | ICD-10-CM

## 2017-07-13 DIAGNOSIS — F99 Mental disorder, not otherwise specified: Secondary | ICD-10-CM

## 2017-07-13 DIAGNOSIS — F422 Mixed obsessional thoughts and acts: Secondary | ICD-10-CM

## 2017-07-13 DIAGNOSIS — F3178 Bipolar disorder, in full remission, most recent episode mixed: Secondary | ICD-10-CM

## 2017-07-13 DIAGNOSIS — I10 Essential (primary) hypertension: Secondary | ICD-10-CM

## 2017-07-13 NOTE — Patient Instructions (Addendum)
   IRMA Jessy Oto, MD Delia Chimes, MD   IF you received an x-ray today, you will receive an invoice from [facility]. Please contact [facility] at [phone] with questions or concerns regarding your invoice.   IF you received labwork today, you will receive an invoice from [facility]. Please contact [facility] at [phone] with questions or concerns regarding your invoice.   Our billing staff will not be able to assist you with questions regarding bills from these companies.  You will be contacted with the lab results as soon as they are available. The fastest way to get your results is to activate your My Chart account. Instructions are located on the last page of this paperwork. If you have not heard from [place] regarding the results in 2 weeks, please contact this office.
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
## 2017-08-16 ENCOUNTER — Other Ambulatory Visit: Payer: Self-pay | Admitting: Internal Medicine

## 2017-10-01 ENCOUNTER — Other Ambulatory Visit: Payer: Self-pay | Admitting: Family Medicine

## 2017-11-07 ENCOUNTER — Encounter: Payer: Self-pay | Admitting: Internal Medicine

## 2017-11-07 ENCOUNTER — Ambulatory Visit: Payer: Managed Care, Other (non HMO) | Admitting: Internal Medicine

## 2017-11-07 VITALS — BP 112/80 | HR 80 | Ht 64.5 in | Wt 235.0 lb

## 2017-11-07 DIAGNOSIS — E1010 Type 1 diabetes mellitus with ketoacidosis without coma: Secondary | ICD-10-CM

## 2017-11-07 LAB — POCT GLYCOSYLATED HEMOGLOBIN (HGB A1C): HEMOGLOBIN A1C: 7.3 % — AB (ref 4.0–5.6)

## 2017-11-07 LAB — MICROALBUMIN / CREATININE URINE RATIO
CREATININE, U: 50.2 mg/dL
MICROALB/CREAT RATIO: 1.4 mg/g (ref 0.0–30.0)
Microalb, Ur: <0.7 mg/dL (ref 0.0–1.9)

## 2017-11-07 MED ORDER — INSULIN LISPRO 100 UNIT/ML ~~LOC~~ SOLN: SUBCUTANEOUS | 3 refills | Status: DC
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
## 2017-11-07 NOTE — Patient Instructions (Addendum)
Please continue: - basal rates: 12 am: 2.7 4 am: 2.7 7 am: 2.7 11 am: 2.7 12 pm: 2.7 1 pm: 1.70 6 pm: 1.45 >> 1.70 - ICR:   12 am: 4  7 am: 4.0  11 am: 4.3 >> 4.5  3:30 pm: 4.0  3 pm: 4.0 4:30 pm: 4.3  5 pm: 3.6  - target: 105-115 >> 110-110 - ISF:  12 am: 20 >> 30 11 am: 30 >> 40 3 pm: 20 >> 30 - Insulin on Board: 3h - bolus wizard: on  Please raise the target to 150 when starting to work outside or exercise.  Please do the following approximately 15 minutes before every meal: - Enter carbs (C) - Enter sugars (S) - Start insulin bolus (I)  Also: - Metformin ER 500 mg 2x a day   Please return in 3-4 months with your sugar log.

## 2017-11-08 LAB — COMPLETE METABOLIC PANEL WITH GFR
AG Ratio: 2.1 (calc) (ref 1.0–2.5)
ALT: 30 U/L — AB (ref 6–29)
AST: 24 U/L (ref 10–35)
Albumin: 4.6 g/dL (ref 3.6–5.1)
Alkaline phosphatase (APISO): 84 U/L (ref 33–130)
BUN: 13 mg/dL (ref 7–25)
CALCIUM: 9.4 mg/dL (ref 8.6–10.4)
CO2: 24 mmol/L (ref 20–32)
CREATININE: 0.69 mg/dL (ref 0.50–1.05)
Chloride: 104 mmol/L (ref 98–110)
GFR, EST NON AFRICAN AMERICAN: 102 mL/min/{1.73_m2} (ref >=60)
GFR, Est African American: 118 mL/min/{1.73_m2} (ref >=60)
Globulin: 2.2 g/dL (calc) (ref 1.9–3.7)
Glucose, Bld: 98 mg/dL (ref 65–99)
Potassium: 4.8 mmol/L (ref 3.5–5.3)
Sodium: 141 mmol/L (ref 135–146)
Total Bilirubin: 0.5 mg/dL (ref 0.2–1.2)
Total Protein: 6.8 g/dL (ref 6.1–8.1)

## 2017-11-08 LAB — SPECIMEN COMPROMISED

## 2017-11-08 LAB — LIPID PANEL
Cholesterol: 128 mg/dL (ref 0–200)
HDL: 48.9 mg/dL (ref >39.00)
LDL Cholesterol: 53 mg/dL (ref 0–99)
NonHDL: 78.9
TRIGLYCERIDES: 128 mg/dL (ref 0.0–149.0)
Total CHOL/HDL Ratio: 3
VLDL: 25.6 mg/dL (ref 0.0–40.0)

## 2017-11-08 LAB — TSH: TSH: 2.39 u[IU]/mL (ref 0.35–4.50)

## 2017-12-25 ENCOUNTER — Encounter: Payer: Self-pay | Admitting: Emergency Medicine

## 2017-12-25 DIAGNOSIS — F411 Generalized anxiety disorder: Secondary | ICD-10-CM

## 2017-12-25 DIAGNOSIS — F3175 Bipolar disorder, in partial remission, most recent episode depressed: Secondary | ICD-10-CM

## 2017-12-25 DIAGNOSIS — F988 Other specified behavioral and emotional disorders with onset usually occurring in childhood and adolescence: Secondary | ICD-10-CM

## 2018-01-10 ENCOUNTER — Ambulatory Visit: Payer: Self-pay | Admitting: Physician Assistant

## 2018-01-27 ENCOUNTER — Other Ambulatory Visit: Payer: Self-pay | Admitting: Physician Assistant

## 2018-01-27 MED ORDER — MODAFINIL 100 MG PO TABS: 150.0000 mg | ORAL_TABLET | Freq: Every day | ORAL | 5 refills | Status: DC

## 2018-02-13 ENCOUNTER — Ambulatory Visit: Payer: Managed Care, Other (non HMO) | Admitting: Physician Assistant

## 2018-02-13 ENCOUNTER — Encounter: Payer: Self-pay | Admitting: Physician Assistant

## 2018-02-13 DIAGNOSIS — R413 Other amnesia: Secondary | ICD-10-CM

## 2018-02-13 DIAGNOSIS — F319 Bipolar disorder, unspecified: Secondary | ICD-10-CM | POA: Diagnosis not present

## 2018-02-13 DIAGNOSIS — F900 Attention-deficit hyperactivity disorder, predominantly inattentive type: Secondary | ICD-10-CM | POA: Diagnosis not present

## 2018-02-13 MED ORDER — MODAFINIL 200 MG PO TABS: 200.0000 mg | ORAL_TABLET | Freq: Every day | ORAL | 5 refills | Status: DC

## 2018-02-13 MED ORDER — QUETIAPINE FUMARATE 300 MG PO TABS: 600.0000 mg | ORAL_TABLET | Freq: Every day | ORAL | 1 refills | Status: DC

## 2018-02-13 MED ORDER — LAMOTRIGINE 150 MG PO TABS: 300.0000 mg | ORAL_TABLET | Freq: Every day | ORAL | 1 refills | Status: DC

## 2018-02-13 NOTE — Progress Notes (Signed)
[facility] Med Check  Patient ID: Karla Rodriguez,  MRN: [id number]  PCP: Wardell Honour, MD  Date of Evaluation: 02/13/2018 Time spent:15 minutes  Chief Complaint:  Chief Complaint    Follow-up      HISTORY/CURRENT STATUS: HPI seen today for 13-month med check but she is not doing well.  Complains of irritability, with a very short fuse.  This is been going on for several months now.  She thinks it may have started when we had the trial of weaning off of the Seroquel and starting lithium last summer.  It is gotten worse over the past couple of months.Patient denies increased energy with decreased need for sleep, no increased talkativeness, no racing thoughts, no impulsivity or risky behaviors, no increased spending, no increased libido, no grandiosity.  Does complain of some depressive symptoms as well.  Does not enjoy things as much as she used to.  Energy and motivation are little bit lower, however she does work not missing any days.  Denies suicidal or homicidal thoughts.  Individual Medical History/ Review of Systems: Changes? :No    Past medications for mental health diagnoses include: Xanax, Lexapro, Rozerem, Saphris, Lamictal, Wellbutrin, Ambien, Nuvigil, Risperdal, Celexa, Seroquel, Latuda, Strattera, lithium  Allergies: Patient has no known allergies.  Current Medications:  Current Outpatient Medications:  .  b complex vitamins capsule, Take 1 capsule by mouth daily., Disp: , Rfl:  .  cetirizine (ZYRTEC) 10 MG tablet, Take 10 mg by mouth daily., Disp: , Rfl:  .  Cholecalciferol (VITAMIN D PO), Take by mouth., Disp: , Rfl:  .  glucagon (GLUCAGON EMERGENCY) 1 MG injection, Inject 1 mg into the muscle once as needed for up to 1 dose., Disp: 1 each, Rfl: 12 .  insulin lispro (HUMALOG) 100 UNIT/ML injection, VIA INSULIN PUMP - 150 UNITS PER DAY, Disp: 150 mL, Rfl: 3 .  lamoTRIgine (LAMICTAL) 150 MG tablet, Take 300 mg by mouth at bedtime., Disp: , Rfl: 1 .  lisinopril  (PRINIVIL,ZESTRIL) 10 MG tablet, TAKE 1 TABLET DAILY, Disp: 90 tablet, Rfl: 3 .  Melatonin 10 MG TABS, Take 10 mg by mouth at bedtime as needed., Disp: , Rfl:  .  metFORMIN (GLUCOPHAGE-XR) 500 MG 24 hr tablet, TAKE 1 TABLET TWICE A DAY, Disp: 180 tablet, Rfl: 1 .  modafinil (PROVIGIL) 100 MG tablet, Take 1 tablet (100 mg total) by mouth daily., Disp: 30 tablet, Rfl: 0 .  ONETOUCH VERIO test strip, USE AS DIRECTED. TESTING FREQUENCY: 4-6X/DAILY. (DX:E10.65), Disp: 500 each, Rfl: 5 .  pantoprazole (PROTONIX) 40 MG tablet, TAKE 1 TABLET DAILY (OFFICE VISIT NEEDED FOR REFILLS), Disp: 90 tablet, Rfl: 4 .  QUEtiapine (SEROQUEL) 400 MG tablet, Take 400 mg by mouth at bedtime., Disp: , Rfl: 1 .  rosuvastatin (CRESTOR) 20 MG tablet, TAKE 1 TABLET AT BEDTIME, Disp: 90 tablet, Rfl: 3 .  TURMERIC PO, Take by mouth., Disp: , Rfl:  Medication Side Effects: none  Family Medical/ Social History: Changes? No  MENTAL HEALTH EXAM:  There were no vitals taken for this visit.There is no height or weight on file to calculate BMI.  General Appearance: Casual, Well Groomed and Obese  Eye Contact:  Good  Speech:  Clear and Coherent  Volume:  Normal  Mood:  Euthymic  Affect:  Appropriate  Thought Process:  Goal Directed  Orientation:  Full (Time, Place, and Person)  Thought Content: Logical   Suicidal Thoughts:  No  Homicidal Thoughts:  No  Memory:  Immediate;  Poor  Judgement:  Good  Insight:  Good  Psychomotor Activity:  Normal  Concentration:  Concentration: Fair and Attention Span: Fair  Recall:  Good  Fund of Knowledge: Good  Language: Good  Assets:  Desire for Improvement  ADL's:  Intact  Cognition: WNL  Prognosis:  Good    DIAGNOSES:    ICD-10-CM   1. Bipolar I disorder ([place]) F31.9   2. Memory difficulties R41.3   3. Attention deficit hyperactivity disorder (ADHD), predominantly inattentive type F90.0     Receiving Psychotherapy: No    RECOMMENDATIONS: Increase Seroquel from 400 mg  to a total of 600 mg daily.  Other options would be to change Seroquel to either Vraylar or Rexulti.  Because she is on the Seroquel and tolerates it fine we agreed that would be the best option at this point. Increase Provigil from 100mg  to 200 mg every morning in order to help with the motivation. Continue Lamictal as above. Return in 4 to 6 weeks.   Donnal Moat, PA-C

## 2018-03-03 ENCOUNTER — Ambulatory Visit: Payer: Managed Care, Other (non HMO) | Admitting: Internal Medicine

## 2018-03-07 ENCOUNTER — Ambulatory Visit: Payer: Self-pay | Admitting: Internal Medicine

## 2018-03-07 DIAGNOSIS — Z0289 Encounter for other administrative examinations: Secondary | ICD-10-CM

## 2018-03-08 ENCOUNTER — Telehealth: Payer: Self-pay | Admitting: Internal Medicine

## 2018-03-08 NOTE — Telephone Encounter (Signed)
Patient no showed today's appt. Please advise on how to follow up. °A. No follow up necessary. °B. Follow up urgent. Contact patient immediately. °C. Follow up necessary. Contact patient and schedule visit in ___ days. °D. Follow up advised. Contact patient and schedule visit in ____weeks. ° °Would you like the NS fee to be applied to this visit? ° °

## 2018-03-08 NOTE — Telephone Encounter (Signed)
3 months

## 2018-03-13 ENCOUNTER — Ambulatory Visit: Payer: 59 | Admitting: Physician Assistant

## 2018-03-13 ENCOUNTER — Encounter: Payer: Self-pay | Admitting: Physician Assistant

## 2018-03-13 DIAGNOSIS — F1021 Alcohol dependence, in remission: Secondary | ICD-10-CM | POA: Diagnosis not present

## 2018-03-13 DIAGNOSIS — F3175 Bipolar disorder, in partial remission, most recent episode depressed: Secondary | ICD-10-CM

## 2018-03-13 DIAGNOSIS — F900 Attention-deficit hyperactivity disorder, predominantly inattentive type: Secondary | ICD-10-CM | POA: Diagnosis not present

## 2018-03-13 DIAGNOSIS — F411 Generalized anxiety disorder: Secondary | ICD-10-CM

## 2018-03-13 NOTE — Progress Notes (Signed)
[facility]  Patient ID: Karla Rodriguez,  MRN: [id number]  PCP: Wardell Honour, MD  Date of Evaluation: 03/13/2018 Time spent:15 minutes  Chief Complaint:  Chief Complaint    Follow-up      HISTORY/CURRENT STATUS: HPI Here for routine med check.    Doing really well now.  Much less irritable.  Much better as far as sleep goes.  "I sleep like a log."  She has been extremely busy at work.  She worked a lot of overtime last week due to a new project rolling out.  Also, her wife had gallbladder surgery last week so things have been a little bit stressful.  "If it had not been for the medication, I am sure I would not have done as well with my attitude and mood.  But I am doing good.  I think my medicines are spot on."  Patient denies loss of interest in usual activities and is able to enjoy things.  Denies decreased energy or motivation.  Appetite has not changed.  No extreme sadness, tearfulness, or feelings of hopelessness.  Denies any changes in concentration, making decisions or remembering things.  Denies suicidal or homicidal thoughts.  Patient denies increased energy with decreased need for sleep, no increased talkativeness, no racing thoughts, no impulsivity or risky behaviors, no increased spending, no increased libido, no grandiosity.  Denies muscle or joint pain, stiffness, or dystonia.  Denies dizziness, syncope, seizures, numbness, tingling, tremor, tics, unsteady gait, slurred speech, confusion.   Individual Medical History/ Review of Systems: Changes? :No    Past medications for mental health diagnoses include: Xanax, Lexapro, Rozerem, Saphris, Lamictal, Wellbutrin, Ambien, Nuvigil, Risperdal, Celexa, Seroquel, Latuda, Strattera, lithium  Allergies: Patient has no known allergies.  Current Medications:  Current Outpatient Medications:  .  b complex vitamins capsule, Take 1 capsule by mouth daily., Disp: , Rfl:  .  cetirizine (ZYRTEC) 10 MG tablet, Take  10 mg by mouth daily., Disp: , Rfl:  .  Cholecalciferol (VITAMIN D PO), Take by mouth., Disp: , Rfl:  .  glucagon (GLUCAGON EMERGENCY) 1 MG injection, Inject 1 mg into the muscle once as needed for up to 1 dose., Disp: 1 each, Rfl: 12 .  insulin lispro (HUMALOG) 100 UNIT/ML injection, VIA INSULIN PUMP - 150 UNITS PER DAY, Disp: 150 mL, Rfl: 3 .  lamoTRIgine (LAMICTAL) 150 MG tablet, Take 2 tablets (300 mg total) by mouth at bedtime., Disp: 180 tablet, Rfl: 1 .  lisinopril (PRINIVIL,ZESTRIL) 10 MG tablet, TAKE 1 TABLET DAILY, Disp: 90 tablet, Rfl: 3 .  Melatonin 10 MG TABS, Take 10 mg by mouth at bedtime as needed., Disp: , Rfl:  .  metFORMIN (GLUCOPHAGE-XR) 500 MG 24 hr tablet, TAKE 1 TABLET TWICE A DAY, Disp: 180 tablet, Rfl: 1 .  modafinil (PROVIGIL) 200 MG tablet, Take 1 tablet (200 mg total) by mouth daily., Disp: 30 tablet, Rfl: 5 .  ONETOUCH VERIO test strip, USE AS DIRECTED. TESTING FREQUENCY: 4-6X/DAILY. (DX:E10.65), Disp: 500 each, Rfl: 5 .  pantoprazole (PROTONIX) 40 MG tablet, TAKE 1 TABLET DAILY (OFFICE VISIT NEEDED FOR REFILLS), Disp: 90 tablet, Rfl: 4 .  QUEtiapine (SEROQUEL) 300 MG tablet, Take 2 tablets (600 mg total) by mouth at bedtime., Disp: 180 tablet, Rfl: 1 .  rosuvastatin (CRESTOR) 20 MG tablet, TAKE 1 TABLET AT BEDTIME, Disp: 90 tablet, Rfl: 3 .  TURMERIC PO, Take by mouth., Disp: , Rfl:  Medication Side Effects: none  Family Medical/ Social History: Changes? No  MENTAL HEALTH EXAM:  There were no vitals taken for this visit.There is no height or weight on file to calculate BMI.  General Appearance: Casual, Well Groomed and Obese  Eye Contact:  Good  Speech:  Clear and Coherent  Volume:  Normal  Mood:  Euthymic  Affect:  Appropriate  Thought Process:  Goal Directed  Orientation:  Full (Time, Place, and Person)  Thought Content: Logical   Suicidal Thoughts:  No  Homicidal Thoughts:  No  Memory:  WNL  Judgement:  Good  Insight:  Good  Psychomotor Activity:   Normal  Concentration:  Concentration: Good and Attention Span: Good  Recall:  Good  Fund of Knowledge: Good  Language: Good  Assets:  Desire for Improvement  ADL's:  Intact  Cognition: WNL  Prognosis:  Good    DIAGNOSES:    ICD-10-CM   1. 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 GAD (generalized anxiety disorder) F41.1   3. Attention deficit hyperactivity disorder (ADHD), predominantly inattentive type F90.0   4. Alcoholism in recovery [facility]) F10.21     Receiving Psychotherapy: No    RECOMMENDATIONS: Continue Lamictal 300 mg nightly. Continue melatonin as needed sleep. Continue Provigil 200 mg every morning. Continue Seroquel 600 mg nightly Continue rare Xanax as needed. Restart psychotherapy with Lina Sayre, LPC, PRN. Return in 6 months or sooner as needed.  Donnal Moat, PA-C

## 2018-03-21 ENCOUNTER — Telehealth: Payer: Self-pay

## 2018-03-21 NOTE — Telephone Encounter (Signed)
Prior authorization submitted for Modafinil 100mg  approved effective 02/19/2018-03/21/2019 through [place] approval to [facility] [phone] Phone [phone]

## 2018-04-18 ENCOUNTER — Ambulatory Visit: Payer: Self-pay | Admitting: *Deleted

## 2018-04-18 NOTE — Telephone Encounter (Signed)
Pt reports "Spinning" dizziness, onset yesterday. States worsened as day went on along with nausea and vomiting episodes. No vomiting today,nausea remains. States vertigo is mild, can ambulate without holding on to things but occurs frequently;is not positional. Also reports "Ringing in ears." Has had congestion/cough x 1 week, denies ear ache, sinus tenderness. Afebrile. States had similar symptoms years ago and saw Dr. Tamala Julian; performed Epley Maneuver that helped at that time. Appt made for tomorrow with Dr. Carlota Raspberry for this acute issue. Care advise given per protocol, pt verbalized understanding.   Reason for Disposition . Vomiting occurs with dizziness  Answer Assessment - Initial Assessment Questions 1. DESCRIPTION: "Describe your dizziness."     spinning 2. VERTIGO: "Do you feel like either you or the room is spinning or tilting?"      yes 3. LIGHTHEADED: "Do you feel lightheaded?" (e.g., somewhat faint, woozy, weak upon standing)     yes 4. SEVERITY: "How bad is it?"  "Can you walk?"   - MILD - Feels unsteady but walking normally.   - MODERATE - Feels very unsteady when walking, but not falling; interferes with normal activities (e.g., school, work) .   - SEVERE - Unable to walk without falling (requires assistance).     Mild but occurs frequently 5. ONSET:  "When did the dizziness begin?"     yesterday 6. AGGRAVATING FACTORS: "Does anything make it worse?" (e.g., standing, change in head position)     no 7. CAUSE: "What do you think is causing the dizziness?"     Saw Dr. Tamala Julian "Years ago" Did Epley  Maneuvers which helped 8. RECURRENT SYMPTOM: "Have you had dizziness before?" If so, ask: "When was the last time?" "What happened that time?"     Years ago 9. OTHER SYMPTOMS: "Do you have any other symptoms?" (e.g., headache, weakness, numbness, vomiting, earache)     Nausea, vomiting yesterday, not today. Ringing in your ears, congested  Protocols used: DIZZINESS - VERTIGO-A-AH

## 2018-04-19 ENCOUNTER — Encounter: Payer: Self-pay | Admitting: Family Medicine

## 2018-04-19 ENCOUNTER — Other Ambulatory Visit: Payer: Self-pay

## 2018-04-19 ENCOUNTER — Ambulatory Visit: Payer: 59 | Admitting: Family Medicine

## 2018-04-19 VITALS — BP 123/78 | HR 88 | Temp 99.0°F | Resp 14 | Ht 64.5 in | Wt 233.2 lb

## 2018-04-19 DIAGNOSIS — R059 Cough, unspecified: Secondary | ICD-10-CM

## 2018-04-19 DIAGNOSIS — J309 Allergic rhinitis, unspecified: Secondary | ICD-10-CM

## 2018-04-19 DIAGNOSIS — R42 Dizziness and giddiness: Secondary | ICD-10-CM

## 2018-04-19 DIAGNOSIS — R05 Cough: Secondary | ICD-10-CM | POA: Diagnosis not present

## 2018-04-19 DIAGNOSIS — Z1239 Encounter for other screening for malignant neoplasm of breast: Secondary | ICD-10-CM

## 2018-04-19 LAB — GLUCOSE, POCT (MANUAL RESULT ENTRY): POC Glucose: 132 mg/dl — AB (ref 70–99)

## 2018-04-19 LAB — POCT CBC
Granulocyte percent: 69 %G (ref 37–80)
HCT, POC: 43.2 % — AB (ref 29–41)
Hemoglobin: 14.6 g/dL (ref 11–14.6)
LYMPH, POC: 1.5 (ref 0.6–3.4)
MCH, POC: 29.1 pg (ref 27–31.2)
MCHC: 33.7 g/dL (ref 31.8–35.4)
MCV: 86.3 fL (ref 76–111)
MID (cbc): 0.4 (ref 0–0.9)
MPV: 8.5 fL (ref 0–99.8)
POC Granulocyte: 4.3 (ref 2–6.9)
POC LYMPH PERCENT: 24.9 %L (ref 10–50)
POC MID %: 6.1 %M (ref 0–12)
Platelet Count, POC: 258 10*3/uL (ref 142–424)
RBC: 5.01 M/uL (ref 4.04–5.48)
RDW, POC: 13.9 %
WBC: 6.2 10*3/uL (ref 4.6–10.2)

## 2018-04-19 MED ORDER — MECLIZINE HCL 25 MG PO TABS: 25.0000 mg | ORAL_TABLET | Freq: Three times a day (TID) | ORAL | 0 refills | Status: DC | PRN

## 2018-04-19 MED ORDER — MONTELUKAST SODIUM 10 MG PO TABS: 10.0000 mg | ORAL_TABLET | Freq: Every day | ORAL | 3 refills | Status: DC

## 2018-04-19 NOTE — Patient Instructions (Addendum)
Current symptoms appear to be due to vertigo.  Try meclizine up to 3 times per day.  If there are any concerns on the blood work I will let you know.  See information below.  If any new or worsening symptoms, proceed to emergency room.  For allergies, continue Zyrtec, Flonase, but I did refill Singulair.  Please schedule follow-up appointment with new primary care provider in the next few months.  Return to the clinic or go to the nearest emergency room if any of your symptoms worsen or new symptoms occur.  Dizziness Dizziness is a common problem. It is a feeling of unsteadiness or light-headedness. You may feel like you are about to faint. Dizziness can lead to injury if you stumble or fall. Anyone can become dizzy, but dizziness is more common in older adults. This condition can be caused by a number of things, including medicines, dehydration, or illness. Follow these instructions at home: Eating and drinking  Drink enough fluid to keep your urine clear or pale yellow. This helps to keep you from becoming dehydrated. Try to drink more clear fluids, such as water.  Do not drink alcohol.  Limit your caffeine intake if told to do so by your health care provider. Check ingredients and nutrition facts to see if a food or beverage contains caffeine.  Limit your salt (sodium) intake if told to do so by your health care provider. Check ingredients and nutrition facts to see if a food or beverage contains sodium. Activity  Avoid making quick movements. ? Rise slowly from chairs and steady yourself until you feel okay. ? In the morning, first sit up on the side of the bed. When you feel okay, stand slowly while you hold onto something until you know that your balance is fine.  If you need to stand in one place for a long time, move your legs often. Tighten and relax the muscles in your legs while you are standing.  Do not drive or use heavy machinery if you feel dizzy.  Avoid bending down if  you feel dizzy. Place items in your home so that they are easy for you to reach without leaning over. Lifestyle  Do not use any products that contain nicotine or tobacco, such as cigarettes and e-cigarettes. If you need help quitting, ask your health care provider.  Try to reduce your stress level by using methods such as yoga or meditation. Talk with your health care provider if you need help to manage your stress. General instructions  Watch your dizziness for any changes.  Take over-the-counter and prescription medicines only as told by your health care provider. Talk with your health care provider if you think that your dizziness is caused by a medicine that you are taking.  Tell a friend or a family member that you are feeling dizzy. If he or she notices any changes in your behavior, have this person call your health care provider.  Keep all follow-up visits as told by your health care provider. This is important. Contact a health care provider if:  Your dizziness does not go away.  Your dizziness or light-headedness gets worse.  You feel nauseous.  You have reduced hearing.  You have new symptoms.  You are unsteady on your feet or you feel like the room is spinning. Get help right away if:  You vomit or have diarrhea and are unable to eat or drink anything.  You have problems talking, walking, swallowing, or using your arms, hands,  or legs.  You feel generally weak.  You are not thinking clearly or you have trouble forming sentences. It may take a friend or family member to notice this.  You have chest pain, abdominal pain, shortness of breath, or sweating.  Your vision changes.  You have any bleeding.  You have a severe headache.  You have neck pain or a stiff neck.  You have a fever. These symptoms may represent a serious problem that is an emergency. Do not wait to see if the symptoms will go away. Get medical help right away. Call your local emergency  services (911 in the [place] Do not drive yourself to the hospital. Summary  Dizziness is a feeling of unsteadiness or light-headedness. This condition can be caused by a number of things, including medicines, dehydration, or illness.  Anyone can become dizzy, but dizziness is more common in older adults.  Drink enough fluid to keep your urine clear or pale yellow. Do not drink alcohol.  Avoid making quick movements if you feel dizzy. Monitor your dizziness for any changes. This information is not intended to replace advice given to you by your health care provider. Make sure you discuss any questions you have with your health care provider. Document Released: 07/21/2000 Document Revised: 02/28/2016 Document Reviewed: 02/28/2016 Elsevier Interactive Patient Education  2019 [employer]. Vertigo Vertigo is the feeling that you or your surroundings are moving when they are not. Vertigo can be dangerous if it occurs while you are doing something that could endanger you or others, such as driving. What are the causes? This condition is caused by a disturbance in the signals that are sent by your body's sensory systems to your brain. Different causes of a disturbance can lead to vertigo, including:  Infections, especially in the inner ear.  A bad reaction to a drug, or misuse of alcohol and medicines.  Withdrawal from drugs or alcohol.  Quickly changing positions, as when lying down or rolling over in bed.  Migraine headaches.  Decreased blood flow to the brain.  Decreased blood pressure.  Increased pressure in the brain from a head or neck injury, stroke, infection, tumor, or bleeding.  Central nervous system disorders. What are the signs or symptoms? Symptoms of this condition usually occur when you move your head or your eyes in different directions. Symptoms may start suddenly, and they usually last for less than a minute. Symptoms may include:  Loss of balance and  falling.  Feeling like you are spinning or moving.  Feeling like your surroundings are spinning or moving.  Nausea and vomiting.  Blurred vision or double vision.  Difficulty hearing.  Slurred speech.  Dizziness.  Involuntary eye movement (nystagmus). Symptoms can be mild and cause only slight annoyance, or they can be severe and interfere with daily life. Episodes of vertigo may return (recur) over time, and they are often triggered by certain movements. Symptoms may improve over time. How is this diagnosed? This condition may be diagnosed based on medical history and the quality of your nystagmus. Your health care provider may test your eye movements by asking you to quickly change positions to trigger the nystagmus. This may be called the Dix-Hallpike test, head thrust test, or roll test. You may be referred to a health care provider who specializes in ear, nose, and throat (ENT) problems (otolaryngologist) or a provider who specializes in disorders of the central nervous system (neurologist). You may have additional testing, including:  A physical exam.  Blood tests.  MRI.  A CT scan.  An electrocardiogram (ECG). This records electrical activity in your heart.  An electroencephalogram (EEG). This records electrical activity in your brain.  Hearing tests. How is this treated? Treatment for this condition depends on the cause and the severity of the symptoms. Treatment options include:  Medicines to treat nausea or vertigo. These are usually used for severe cases. Some medicines that are used to treat other conditions may also reduce or eliminate vertigo symptoms. These include: ? Medicines that control allergies (antihistamines). ? Medicines that control seizures (anticonvulsants). ? Medicines that relieve depression (antidepressants). ? Medicines that relieve anxiety (sedatives).  Head movements to adjust your inner ear back to normal. If your vertigo is caused by an  ear problem, your health care provider may recommend certain movements to correct the problem.  Surgery. This is rare. Follow these instructions at home: Safety  Move slowly.Avoid sudden body or head movements.  Avoid driving.  Avoid operating heavy machinery.  Avoid doing any tasks that would cause danger to you or others if you would have a vertigo episode during the task.  If you have trouble walking or keeping your balance, try using a cane for stability. If you feel dizzy or unstable, sit down right away.  Return to your normal activities as told by your health care provider. Ask your health care provider what activities are safe for you. General instructions  Take over-the-counter and prescription medicines only as told by your health care provider.  Avoid certain positions or movements as told by your health care provider.  Drink enough fluid to keep your urine clear or pale yellow.  Keep all follow-up visits as told by your health care provider. This is important. Contact a health care provider if:  Your medicines do not relieve your vertigo or they make it worse.  You have a fever.  Your condition gets worse or you develop new symptoms.  Your family or friends notice any behavioral changes.  Your nausea or vomiting gets worse.  You have numbness or a "pins and needles" sensation in part of your body. Get help right away if:  You have difficulty moving or speaking.  You are always dizzy.  You faint.  You develop severe headaches.  You have weakness in your hands, arms, or legs.  You have changes in your hearing or vision.  You develop a stiff neck.  You develop sensitivity to light. This information is not intended to replace advice given to you by your health care provider. Make sure you discuss any questions you have with your health care provider. Document Released: 11/04/2004 Document Revised: 07/09/2015 Document Reviewed: 05/20/2014 Elsevier  Interactive Patient Education  [employer].   If you have lab work done today you will be contacted with your lab results within the next 2 weeks.  If you have not heard from [place] then please contact us. The fastest way to get your results is to register for My Chart.   IF you received an x-ray today, you will receive an invoice from [facility]. Please contact [facility] at [phone] with questions or concerns regarding your invoice.   IF you received labwork today, you will receive an invoice from [facility]. Please contact [facility] at [phone] with questions or concerns regarding your invoice.   Our billing staff will not be able to assist you with questions regarding bills from these companies.  You will be contacted with the lab results as soon as they are available.  The fastest way to get your results is to activate your My Chart account. Instructions are located on the last page of this paperwork. If you have not heard from [place] regarding the results in 2 weeks, please contact this office.
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
## 2018-04-20 LAB — BASIC METABOLIC PANEL
BUN/Creatinine Ratio: 12 (ref 9–23)
BUN: 9 mg/dL (ref 6–24)
CO2: 20 mmol/L (ref 20–29)
Calcium: 9.6 mg/dL (ref 8.7–10.2)
Chloride: 102 mmol/L (ref 96–106)
Creatinine, Ser: 0.78 mg/dL (ref 0.57–1.00)
GFR calc Af Amer: 103 mL/min/{1.73_m2} (ref >59)
GFR calc non Af Amer: 89 mL/min/{1.73_m2} (ref >59)
Glucose: 130 mg/dL — ABNORMAL HIGH (ref 65–99)
Potassium: 4.6 mmol/L (ref 3.5–5.2)
Sodium: 142 mmol/L (ref 134–144)

## 2018-04-22 ENCOUNTER — Encounter: Payer: Self-pay | Admitting: Family Medicine

## 2018-06-07 ENCOUNTER — Encounter: Payer: Self-pay | Admitting: Emergency Medicine

## 2018-06-07 ENCOUNTER — Ambulatory Visit: Payer: 59 | Admitting: Emergency Medicine

## 2018-06-07 ENCOUNTER — Other Ambulatory Visit: Payer: Self-pay

## 2018-06-07 ENCOUNTER — Telehealth: Payer: Self-pay | Admitting: Emergency Medicine

## 2018-06-07 VITALS — BP 127/81 | HR 89 | Temp 98.7°F | Resp 16 | Ht 65.0 in | Wt 240.0 lb

## 2018-06-07 DIAGNOSIS — E109 Type 1 diabetes mellitus without complications: Secondary | ICD-10-CM

## 2018-06-07 DIAGNOSIS — F1021 Alcohol dependence, in remission: Secondary | ICD-10-CM | POA: Diagnosis not present

## 2018-06-07 DIAGNOSIS — I10 Essential (primary) hypertension: Secondary | ICD-10-CM

## 2018-06-07 DIAGNOSIS — K219 Gastro-esophageal reflux disease without esophagitis: Secondary | ICD-10-CM | POA: Diagnosis not present

## 2018-06-07 DIAGNOSIS — G4733 Obstructive sleep apnea (adult) (pediatric): Secondary | ICD-10-CM | POA: Diagnosis not present

## 2018-06-07 DIAGNOSIS — F3178 Bipolar disorder, in full remission, most recent episode mixed: Secondary | ICD-10-CM

## 2018-06-07 MED ORDER — PANTOPRAZOLE SODIUM 40 MG PO TBEC: 40.0000 mg | DELAYED_RELEASE_TABLET | Freq: Every day | ORAL | 3 refills | Status: DC

## 2018-06-07 NOTE — Telephone Encounter (Signed)
06/07/2018 - PATIENT HAS AN APPOINTMENT AT 1:20pm TODAY (06/07/18) TO ESTABLISH CARE WITH DR. Kittie Plater. I LEFT HER A VOICE MAIL TELLING HER SHE DOES HAVE TO COME INTO THE OFFICE BECAUSE DR. MIGUEL HAS NOT SEEN HER BEFORE. [place]

## 2018-06-07 NOTE — Patient Instructions (Addendum)
If you have lab work done today you will be contacted with your lab results within the next 2 weeks.  If you have not heard from [place] then please contact us. The fastest way to get your results is to register for My Chart.   IF you received an x-ray today, you will receive an invoice from [facility]. Please contact [facility] at [phone] with questions or concerns regarding your invoice.   IF you received labwork today, you will receive an invoice from [place]. Please contact [facility] at [phone] with questions or concerns regarding your invoice.   Our billing staff will not be able to assist you with questions regarding bills from these companies.  You will be contacted with the lab results as soon as they are available. The fastest way to get your results is to activate your My Chart account. Instructions are located on the last page of this paperwork. If you have not heard from [place] regarding the results in 2 weeks, please contact this office.    Health Maintenance, Female Adopting a healthy lifestyle and getting preventive care can go a long way to promote health and wellness. Talk with your health care provider about what schedule of regular examinations is right for you. This is a good chance for you to check in with your provider about disease prevention and staying healthy. In between checkups, there are plenty of things you can do on your own. Experts have done a lot of research about which lifestyle changes and preventive measures are most likely to keep you healthy. Ask your health care provider for more information. Weight and diet Eat a healthy diet  Be sure to include plenty of vegetables, fruits, low-fat dairy products, and lean protein.  Do not eat a lot of foods high in solid fats, added sugars, or salt.  Get regular exercise. This is one of the most important things you can do for your health. ? Most adults should exercise for at least 150  minutes each week. The exercise should increase your heart rate and make you sweat (moderate-intensity exercise). ? Most adults should also do strengthening exercises at least twice a week. This is in addition to the moderate-intensity exercise. Maintain a healthy weight  Body mass index (BMI) is a measurement that can be used to identify possible weight problems. It estimates body fat based on height and weight. Your health care provider can help determine your BMI and help you achieve or maintain a healthy weight.  For females 12 years of age and older: ? A BMI below 18.5 is considered underweight. ? A BMI of 18.5 to 24.9 is normal. ? A BMI of 25 to 29.9 is considered overweight. ? A BMI of 30 and above is considered obese. Watch levels of cholesterol and blood lipids  You should start having your blood tested for lipids and cholesterol at 51 years of age, then have this test every 5 years.  You may need to have your cholesterol levels checked more often if: ? Your lipid or cholesterol levels are high. ? You are older than 51 years of age. ? You are at high risk for heart disease. Cancer screening Lung Cancer  Lung cancer screening is recommended for adults 58-36 years old who are at high risk for lung cancer because of a history of smoking.  A yearly low-dose CT scan of the lungs is recommended for people who: ? Currently smoke. ? Have quit within the past 15  years. ? Have at least a 30-pack-year history of smoking. A pack year is smoking an average of one pack of cigarettes a day for 1 year.  Yearly screening should continue until it has been 15 years since you quit.  Yearly screening should stop if you develop a health problem that would prevent you from having lung cancer treatment. Breast Cancer  Practice breast self-awareness. This means understanding how your breasts normally appear and feel.  It also means doing regular breast self-exams. Let your health care provider  know about any changes, no matter how small.  If you are in your 20s or 30s, you should have a clinical breast exam (CBE) by a health care provider every 1-3 years as part of a regular health exam.  If you are 3 or older, have a CBE every year. Also consider having a breast X-ray (mammogram) every year.  If you have a family history of breast cancer, talk to your health care provider about genetic screening.  If you are at high risk for breast cancer, talk to your health care provider about having an MRI and a mammogram every year.  Breast cancer gene (BRCA) assessment is recommended for women who have family members with BRCA-related cancers. BRCA-related cancers include: ? Breast. ? Ovarian. ? Tubal. ? Peritoneal cancers.  Results of the assessment will determine the need for genetic counseling and BRCA1 and BRCA2 testing. Cervical Cancer Your health care provider may recommend that you be screened regularly for cancer of the pelvic organs (ovaries, uterus, and vagina). This screening involves a pelvic examination, including checking for microscopic changes to the surface of your cervix (Pap test). You may be encouraged to have this screening done every 3 years, beginning at age 63.  For women ages 71-65, health care providers may recommend pelvic exams and Pap testing every 3 years, or they may recommend the Pap and pelvic exam, combined with testing for human papilloma virus (HPV), every 5 years. Some types of HPV increase your risk of cervical cancer. Testing for HPV may also be done on women of any age with unclear Pap test results.  Other health care providers may not recommend any screening for nonpregnant women who are considered low risk for pelvic cancer and who do not have symptoms. Ask your health care provider if a screening pelvic exam is right for you.  If you have had past treatment for cervical cancer or a condition that could lead to cancer, you need Pap tests and  screening for cancer for at least 20 years after your treatment. If Pap tests have been discontinued, your risk factors (such as having a new sexual partner) need to be reassessed to determine if screening should resume. Some women have medical problems that increase the chance of getting cervical cancer. In these cases, your health care provider may recommend more frequent screening and Pap tests. Colorectal Cancer  This type of cancer can be detected and often prevented.  Routine colorectal cancer screening usually begins at 51 years of age and continues through 51 years of age.  Your health care provider may recommend screening at an earlier age if you have risk factors for colon cancer.  Your health care provider may also recommend using home test kits to check for hidden blood in the stool.  A small camera at the end of a tube can be used to examine your colon directly (sigmoidoscopy or colonoscopy). This is done to check for the earliest forms of colorectal cancer.  Routine screening usually begins at age 50.  Direct examination of the colon should be repeated every 5-10 years through 51 years of age. However, you may need to be screened more often if early forms of precancerous polyps or small growths are found. Skin Cancer  Check your skin from head to toe regularly.  Tell your health care provider about any new moles or changes in moles, especially if there is a change in a mole's shape or color.  Also tell your health care provider if you have a mole that is larger than the size of a pencil eraser.  Always use sunscreen. Apply sunscreen liberally and repeatedly throughout the day.  Protect yourself by wearing long sleeves, pants, a wide-brimmed hat, and sunglasses whenever you are outside. Heart disease, diabetes, and high blood pressure  High blood pressure causes heart disease and increases the risk of stroke. High blood pressure is more likely to develop in: ? People who  have blood pressure in the high end of the normal range (130-139/85-89 mm Hg). ? People who are overweight or obese. ? People who are African American.  If you are 18-39 years of age, have your blood pressure checked every 3-5 years. If you are 40 years of age or older, have your blood pressure checked every year. You should have your blood pressure measured twice-once when you are at a hospital or clinic, and once when you are not at a hospital or clinic. Record the average of the two measurements. To check your blood pressure when you are not at a hospital or clinic, you can use: ? An automated blood pressure machine at a pharmacy. ? A home blood pressure monitor.  If you are between 55 years and 79 years old, ask your health care provider if you should take aspirin to prevent strokes.  Have regular diabetes screenings. This involves taking a blood sample to check your fasting blood sugar level. ? If you are at a normal weight and have a low risk for diabetes, have this test once every three years after 51 years of age. ? If you are overweight and have a high risk for diabetes, consider being tested at a younger age or more often. Preventing infection Hepatitis B  If you have a higher risk for hepatitis B, you should be screened for this virus. You are considered at high risk for hepatitis B if: ? You were born in a country where hepatitis B is common. Ask your health care provider which countries are considered high risk. ? Your parents were born in a high-risk country, and you have not been immunized against hepatitis B (hepatitis B vaccine). ? You have HIV or AIDS. ? You use needles to inject street drugs. ? You live with someone who has hepatitis B. ? You have had sex with someone who has hepatitis B. ? You get hemodialysis treatment. ? You take certain medicines for conditions, including cancer, organ transplantation, and autoimmune conditions. Hepatitis C  Blood testing is  recommended for: ? Everyone born from 1945 through 1965. ? Anyone with known risk factors for hepatitis C. Sexually transmitted infections (STIs)  You should be screened for sexually transmitted infections (STIs) including gonorrhea and chlamydia if: ? You are sexually active and are younger than 51 years of age. ? You are older than 51 years of age and your health care provider tells you that you are at risk for this type of infection. ? Your sexual activity has changed since you   were last screened and you are at an increased risk for chlamydia or gonorrhea. Ask your health care provider if you are at risk.  If you do not have HIV, but are at risk, it may be recommended that you take a prescription medicine daily to prevent HIV infection. This is called pre-exposure prophylaxis (PrEP). You are considered at risk if: ? You are sexually active and do not regularly use condoms or know the HIV status of your partner(s). ? You take drugs by injection. ? You are sexually active with a partner who has HIV. Talk with your health care provider about whether you are at high risk of being infected with HIV. If you choose to begin PrEP, you should first be tested for HIV. You should then be tested every 3 months for as long as you are taking PrEP. Pregnancy  If you are premenopausal and you may become pregnant, ask your health care provider about preconception counseling.  If you may become pregnant, take 400 to 800 micrograms (mcg) of folic acid every day.  If you want to prevent pregnancy, talk to your health care provider about birth control (contraception). Osteoporosis and menopause  Osteoporosis is a disease in which the bones lose minerals and strength with aging. This can result in serious bone fractures. Your risk for osteoporosis can be identified using a bone density scan.  If you are 65 years of age or older, or if you are at risk for osteoporosis and fractures, ask your health care  provider if you should be screened.  Ask your health care provider whether you should take a calcium or vitamin D supplement to lower your risk for osteoporosis.  Menopause may have certain physical symptoms and risks.  Hormone replacement therapy may reduce some of these symptoms and risks. Talk to your health care provider about whether hormone replacement therapy is right for you. Follow these instructions at home:  Schedule regular health, dental, and eye exams.  Stay current with your immunizations.  Do not use any tobacco products including cigarettes, chewing tobacco, or electronic cigarettes.  If you are pregnant, do not drink alcohol.  If you are breastfeeding, limit how much and how often you drink alcohol.  Limit alcohol intake to no more than 1 drink per day for nonpregnant women. One drink equals 12 ounces of beer, 5 ounces of wine, or 1 ounces of hard liquor.  Do not use street drugs.  Do not share needles.  Ask your health care provider for help if you need support or information about quitting drugs.  Tell your health care provider if you often feel depressed.  Tell your health care provider if you have ever been abused or do not feel safe at home. This information is not intended to replace advice given to you by your health care provider. Make sure you discuss any questions you have with your health care provider. Document Released: 08/10/2010 Document Revised: 07/03/2015 Document Reviewed: 10/29/2014 [employer] Interactive Patient Education  2019 [employer]  
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
## 2018-06-13 ENCOUNTER — Encounter: Payer: Self-pay | Admitting: Internal Medicine

## 2018-06-13 ENCOUNTER — Ambulatory Visit (INDEPENDENT_AMBULATORY_CARE_PROVIDER_SITE_OTHER): Payer: Self-pay | Admitting: Internal Medicine

## 2018-06-13 DIAGNOSIS — E1010 Type 1 diabetes mellitus with ketoacidosis without coma: Secondary | ICD-10-CM

## 2018-06-13 MED ORDER — METFORMIN HCL ER 500 MG PO TB24: 500.0000 mg | ORAL_TABLET | Freq: Two times a day (BID) | ORAL | 3 refills | Status: DC

## 2018-06-13 NOTE — Patient Instructions (Addendum)
Please change: - basal rates: 12 am: 2.85 >> 2.70 3 am: 2.85 11 am: 2.80 1 pm: 1.50 6 pm: 1.55 - ICR:   12 am: 4.0  7 am: 4.0 >> 3.7  11 am: 4.3 >> 4.8 (may increase to 5.2 if sugars are still low after lunch)  3:30 pm: 4.0  3 pm: 4.0 4:30 pm: 4.3  5 pm: 3.6  - target: 105-115 - ISF:  12 am: 20 11 am: 30 3 pm: 20 - Insulin on Board: 3h - bolus wizard: on  Please use a target of 150 when working outside or exercising.  Please do the following approximately 15 minutes before every meal: - Enter carbs (C) - Enter sugars (S) - Start insulin bolus (I)  Also continue: - Metformin ER 500 mg 2x a day   Please return in 3 months with your sugar log.
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
## 2018-07-17 ENCOUNTER — Ambulatory Visit: Payer: 59 | Admitting: Emergency Medicine

## 2018-07-17 ENCOUNTER — Encounter: Payer: Self-pay | Admitting: Emergency Medicine

## 2018-07-17 ENCOUNTER — Other Ambulatory Visit: Payer: Self-pay

## 2018-07-17 VITALS — BP 124/76 | HR 105 | Temp 98.9°F | Resp 16 | Ht 64.25 in | Wt 241.0 lb

## 2018-07-17 DIAGNOSIS — N898 Other specified noninflammatory disorders of vagina: Secondary | ICD-10-CM | POA: Diagnosis not present

## 2018-07-17 DIAGNOSIS — R309 Painful micturition, unspecified: Secondary | ICD-10-CM | POA: Diagnosis not present

## 2018-07-17 DIAGNOSIS — N390 Urinary tract infection, site not specified: Secondary | ICD-10-CM | POA: Diagnosis not present

## 2018-07-17 DIAGNOSIS — R350 Frequency of micturition: Secondary | ICD-10-CM | POA: Diagnosis not present

## 2018-07-17 DIAGNOSIS — Z8639 Personal history of other endocrine, nutritional and metabolic disease: Secondary | ICD-10-CM

## 2018-07-17 LAB — POCT URINALYSIS DIP (MANUAL ENTRY)
Bilirubin, UA: NEGATIVE
Glucose, UA: NEGATIVE mg/dL
Ketones, POC UA: NEGATIVE mg/dL
Nitrite, UA: POSITIVE — AB
Protein Ur, POC: NEGATIVE mg/dL
Spec Grav, UA: 1.015 (ref 1.010–1.025)
Urobilinogen, UA: 0.2 E.U./dL
pH, UA: 7.5 (ref 5.0–8.0)

## 2018-07-17 LAB — POC MICROSCOPIC URINALYSIS (UMFC): Mucus: ABSENT

## 2018-07-17 MED ORDER — CIPROFLOXACIN HCL 500 MG PO TABS: 500.0000 mg | ORAL_TABLET | Freq: Two times a day (BID) | ORAL | 0 refills | Status: AC

## 2018-07-17 MED ORDER — FLUCONAZOLE 150 MG PO TABS: 150.0000 mg | ORAL_TABLET | Freq: Once | ORAL | 0 refills | Status: AC

## 2018-07-17 NOTE — Patient Instructions (Addendum)
If you have lab work done today you will be contacted with your lab results within the next 2 weeks.  If you have not heard from [place] then please contact us. The fastest way to get your results is to register for My Chart.   IF you received an x-ray today, you will receive an invoice from [facility]. Please contact [facility] at [phone] with questions or concerns regarding your invoice.   IF you received labwork today, you will receive an invoice from [facility]. Please contact [facility] at [phone] with questions or concerns regarding your invoice.   Our billing staff will not be able to assist you with questions regarding bills from these companies.  You will be contacted with the lab results as soon as they are available. The fastest way to get your results is to activate your My Chart account. Instructions are located on the last page of this paperwork. If you have not heard from [place] regarding the results in 2 weeks, please contact this office.     Urinary Tract Infection, Adult A urinary tract infection (UTI) is an infection of any part of the urinary tract. The urinary tract includes:  The kidneys.  The ureters.  The bladder.  The urethra. These organs make, store, and get rid of pee (urine) in the body. What are the causes? This is caused by germs (bacteria) in your genital area. These germs grow and cause swelling (inflammation) of your urinary tract. What increases the risk? You are more likely to develop this condition if:  You have a small, thin tube (catheter) to drain pee.  You cannot control when you pee or poop (incontinence).  You are female, and: ? You use these methods to prevent pregnancy: ? A medicine that kills sperm (spermicide). ? A device that blocks sperm (diaphragm). ? You have low levels of a female hormone (estrogen). ? You are pregnant.  You have genes that add to your risk.  You are sexually active.  You take  antibiotic medicines.  You have trouble peeing because of: ? A prostate that is bigger than normal, if you are female. ? A blockage in the part of your body that drains pee from the bladder (urethra). ? A kidney stone. ? A nerve condition that affects your bladder (neurogenic bladder). ? Not getting enough to drink. ? Not peeing often enough.  You have other conditions, such as: ? Diabetes. ? A weak disease-fighting system (immune system). ? Sickle cell disease. ? Gout. ? Injury of the spine. What are the signs or symptoms? Symptoms of this condition include:  Needing to pee right away (urgently).  Peeing often.  Peeing small amounts often.  Pain or burning when peeing.  Blood in the pee.  Pee that smells bad or not like normal.  Trouble peeing.  Pee that is cloudy.  Fluid coming from the vagina, if you are female.  Pain in the belly or lower back. Other symptoms include:  Throwing up (vomiting).  No urge to eat.  Feeling mixed up (confused).  Being tired and grouchy (irritable).  A fever.  Watery poop (diarrhea). How is this treated? This condition may be treated with:  Antibiotic medicine.  Other medicines.  Drinking enough water. Follow these instructions at home:  Medicines  Take over-the-counter and prescription medicines only as told by your doctor.  If you were prescribed an antibiotic medicine, take it as told by your doctor. Do not stop taking it even if  you start to feel better. General instructions  Make sure you: ? Pee until your bladder is empty. ? Do not hold pee for a long time. ? Empty your bladder after sex. ? Wipe from front to back after pooping if you are a female. Use each tissue one time when you wipe.  Drink enough fluid to keep your pee pale yellow.  Keep all follow-up visits as told by your doctor. This is important. Contact a doctor if:  You do not get better after 1-2 days.  Your symptoms go away and then come  back. Get help right away if:  You have very bad back pain.  You have very bad pain in your lower belly.  You have a fever.  You are sick to your stomach (nauseous).  You are throwing up. Summary  A urinary tract infection (UTI) is an infection of any part of the urinary tract.  This condition is caused by germs in your genital area.  There are many risk factors for a UTI. These include having a small, thin tube to drain pee and not being able to control when you pee or poop.  Treatment includes antibiotic medicines for germs.  Drink enough fluid to keep your pee pale yellow. This information is not intended to replace advice given to you by your health care provider. Make sure you discuss any questions you have with your health care provider. Document Released: 07/14/2007 Document Revised: 08/04/2017 Document Reviewed: 08/04/2017 Elsevier Interactive Patient Education  2019 [employer].
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
## 2018-07-17 NOTE — Progress Notes (Signed)
102

## 2018-07-18 LAB — URINE CULTURE

## 2018-08-13 ENCOUNTER — Other Ambulatory Visit: Payer: Self-pay | Admitting: Physician Assistant

## 2018-08-14 ENCOUNTER — Other Ambulatory Visit: Payer: Self-pay | Admitting: Physician Assistant

## 2018-08-14 ENCOUNTER — Telehealth: Payer: Self-pay | Admitting: Physician Assistant

## 2018-08-14 ENCOUNTER — Other Ambulatory Visit: Payer: Self-pay | Admitting: Family Medicine

## 2018-08-14 DIAGNOSIS — J309 Allergic rhinitis, unspecified: Secondary | ICD-10-CM

## 2018-08-14 DIAGNOSIS — R05 Cough: Secondary | ICD-10-CM

## 2018-08-14 DIAGNOSIS — R059 Cough, unspecified: Secondary | ICD-10-CM

## 2018-08-14 NOTE — Telephone Encounter (Signed)
Last fill 06/12

## 2018-08-14 NOTE — Telephone Encounter (Signed)
Patient stated pharmacy has no more refills on file for Medafonil 200 mg., send to [facility].

## 2018-08-15 ENCOUNTER — Other Ambulatory Visit: Payer: Self-pay

## 2018-08-15 MED ORDER — MODAFINIL 200 MG PO TABS: 200.0000 mg | ORAL_TABLET | Freq: Every day | ORAL | 1 refills | Status: DC

## 2018-08-15 NOTE — Telephone Encounter (Signed)
Pended for approval.

## 2018-09-05 ENCOUNTER — Encounter: Payer: Self-pay | Admitting: Emergency Medicine

## 2018-09-05 ENCOUNTER — Other Ambulatory Visit: Payer: Self-pay | Admitting: *Deleted

## 2018-09-05 MED ORDER — LISINOPRIL 10 MG PO TABS: 10.0000 mg | ORAL_TABLET | Freq: Every day | ORAL | 0 refills | Status: DC

## 2018-09-11 ENCOUNTER — Encounter: Payer: Self-pay | Admitting: Physician Assistant

## 2018-09-11 ENCOUNTER — Other Ambulatory Visit: Payer: Self-pay

## 2018-09-11 ENCOUNTER — Ambulatory Visit (INDEPENDENT_AMBULATORY_CARE_PROVIDER_SITE_OTHER): Payer: Managed Care, Other (non HMO) | Admitting: Physician Assistant

## 2018-09-11 DIAGNOSIS — F900 Attention-deficit hyperactivity disorder, predominantly inattentive type: Secondary | ICD-10-CM | POA: Diagnosis not present

## 2018-09-11 DIAGNOSIS — F319 Bipolar disorder, unspecified: Secondary | ICD-10-CM | POA: Diagnosis not present

## 2018-09-11 DIAGNOSIS — F411 Generalized anxiety disorder: Secondary | ICD-10-CM

## 2018-09-11 DIAGNOSIS — F1021 Alcohol dependence, in remission: Secondary | ICD-10-CM

## 2018-09-11 MED ORDER — QUETIAPINE FUMARATE 300 MG PO TABS: 300.0000 mg | ORAL_TABLET | Freq: Every day | ORAL | 0 refills | Status: DC

## 2018-09-11 MED ORDER — MODAFINIL 200 MG PO TABS: 200.0000 mg | ORAL_TABLET | Freq: Every day | ORAL | 5 refills | Status: DC

## 2018-09-11 NOTE — Progress Notes (Signed)
[facility] Med Check  Patient ID: Karla Rodriguez,  MRN: [id number]  PCP: Horald Pollen, MD  Date of Evaluation: 09/11/2018 Time spent:15 minutes  Chief Complaint:  Chief Complaint    Follow-up     Virtual Visit via Telephone Note  I connected with patient by a video enabled telemedicine application or telephone, with their informed consent, and verified patient privacy and that I am speaking with the correct person using two identifiers.  I am private, in my home and the patient is home.   I discussed the limitations, risks, security and privacy concerns of performing an evaluation and management service by telephone and the availability of in person appointments. I also discussed with the patient that there may be a patient responsible charge related to this service. The patient expressed understanding and agreed to proceed.   I discussed the assessment and treatment plan with the patient. The patient was provided an opportunity to ask questions and all were answered. The patient agreed with the plan and demonstrated an understanding of the instructions.   The patient was advised to call back or seek an in-person evaluation if the symptoms worsen or if the condition fails to improve as anticipated.  I provided 15 minutes of non-face-to-face time during this encounter.  HISTORY/CURRENT STATUS: HPI For routine med check.  Doing great!  She cut back on the Seroquel b/c it was making her too groggy and had decreased concentration. She had no withdrawals or worsening of mood, and is happy with the way she feels now.  Sleeps well.  Anxiety is not a problem most of the time, although w/ COVID, she has gotten a little 'stir-crazy at time.'  She has been using CBD oil which helps with the anxiety.  Patient denies loss of interest in usual activities and is able to enjoy things.  Denies decreased energy or motivation.  Appetite has not changed.  No extreme sadness, tearfulness, or  feelings of hopelessness.  Denies any changes in concentration, making decisions or remembering things.  Denies suicidal or homicidal thoughts.  Patient denies increased energy with decreased need for sleep, no increased talkativeness, no racing thoughts, no impulsivity or risky behaviors, no increased spending, no increased libido, no grandiosity.  The modafinil really helps a lot with her focus and concentration.  Denies dizziness, syncope, seizures, numbness, tingling, tremor, tics, unsteady gait, slurred speech, confusion. Denies muscle or joint pain, stiffness, or dystonia.  Individual Medical History/ Review of Systems: Changes? :No    Past medications for mental health diagnoses include: Xanax, Lexapro, Rozerem, Saphris, Lamictal, Wellbutrin, Ambien, Nuvigil, Risperdal, Celexa, Seroquel, Latuda, Strattera, lithium  Allergies: Patient has no known allergies.  Current Medications:  Current Outpatient Medications:  .  b complex vitamins capsule, Take 1 capsule by mouth daily., Disp: , Rfl:  .  CANNABIDIOL PO, Take by mouth., Disp: , Rfl:  .  cetirizine (ZYRTEC) 10 MG tablet, Take 10 mg by mouth daily., Disp: , Rfl:  .  Cholecalciferol (VITAMIN D PO), Take by mouth., Disp: , Rfl:  .  glucagon (GLUCAGON EMERGENCY) 1 MG injection, Inject 1 mg into the muscle once as needed for up to 1 dose., Disp: 1 each, Rfl: 12 .  insulin lispro (HUMALOG) 100 UNIT/ML injection, VIA INSULIN PUMP - 150 UNITS PER DAY, Disp: 150 mL, Rfl: 3 .  lamoTRIgine (LAMICTAL) 150 MG tablet, TAKE 2 TABLETS AT BEDTIME, Disp: 180 tablet, Rfl: 0 .  lisinopril (ZESTRIL) 10 MG tablet, Take 1 tablet (10 mg total)  by mouth daily., Disp: 90 tablet, Rfl: 0 .  meclizine (ANTIVERT) 25 MG tablet, Take 1 tablet (25 mg total) by mouth 3 (three) times daily as needed for dizziness., Disp: 30 tablet, Rfl: 0 .  Melatonin 10 MG TABS, Take 10 mg by mouth at bedtime as needed., Disp: , Rfl:  .  metFORMIN (GLUCOPHAGE-XR) 500 MG 24 hr  tablet, Take 1 tablet (500 mg total) by mouth 2 (two) times daily., Disp: 180 tablet, Rfl: 3 .  modafinil (PROVIGIL) 200 MG tablet, Take 1 tablet (200 mg total) by mouth daily., Disp: 30 tablet, Rfl: 5 .  montelukast (SINGULAIR) 10 MG tablet, TAKE 1 TABLET BY MOUTH EVERYDAY AT BEDTIME, Disp: 90 tablet, Rfl: 1 .  ONETOUCH VERIO test strip, USE AS DIRECTED. TESTING FREQUENCY: 4-6X/DAILY. (DX:E10.65), Disp: 500 each, Rfl: 5 .  QUEtiapine (SEROQUEL) 300 MG tablet, TAKE 2 TABLETS AT BEDTIME, Disp: 180 tablet, Rfl: 0 .  rosuvastatin (CRESTOR) 20 MG tablet, TAKE 1 TABLET AT BEDTIME, Disp: 90 tablet, Rfl: 3 .  TURMERIC PO, Take by mouth., Disp: , Rfl:  .  pantoprazole (PROTONIX) 40 MG tablet, Take 1 tablet (40 mg total) by mouth daily., Disp: 90 tablet, Rfl: 3 Medication Side Effects: none  Family Medical/ Social History: Changes? Yes working from home.  MENTAL HEALTH EXAM:  There were no vitals taken for this visit.There is no height or weight on file to calculate BMI.  General Appearance: Unable to assess  Eye Contact:  Unable to assess  Speech:  Clear and Coherent  Volume:  Normal  Mood:  Euthymic  Affect:  Unable to assess  Thought Process:  Goal Directed  Orientation:  Full (Time, Place, and Person)  Thought Content: Logical   Suicidal Thoughts:  No  Homicidal Thoughts:  No  Memory:  WNL  Judgement:  Good  Insight:  Good  Psychomotor Activity:  Unable to assess  Concentration:  Concentration: Good  Recall:  Good  Fund of Knowledge: Good  Language: Good  Assets:  Desire for Improvement  ADL's:  Intact  Cognition: WNL  Prognosis:  Good    DIAGNOSES:    ICD-10-CM   1. Bipolar I disorder ([place])  F31.9   2. GAD (generalized anxiety disorder)  F41.1   3. Attention deficit hyperactivity disorder (ADHD), predominantly inattentive type  F90.0   4. Alcoholism in recovery [facility])  F10.21     Receiving Psychotherapy: No    RECOMMENDATIONS:  Continue Seroquel 300 mg at at bedtime.   Continue melatonin as needed.   Continue Lamictal 150 mg nightly, 2 p.o. nightly. Continue modafinil 200 mg every morning Return in 6 months.  Donnal Moat, PA-C   This record has been created using [employer].  Chart creation errors have been sought, but may not always have been located and corrected. Such creation errors do not reflect on the standard of medical care.

## 2018-10-15 ENCOUNTER — Other Ambulatory Visit: Payer: Self-pay | Admitting: Family Medicine

## 2018-10-27 ENCOUNTER — Encounter: Payer: Self-pay | Admitting: Family Medicine

## 2018-10-27 ENCOUNTER — Other Ambulatory Visit: Payer: Self-pay | Admitting: Family Medicine

## 2018-10-27 DIAGNOSIS — R42 Dizziness and giddiness: Secondary | ICD-10-CM

## 2018-10-27 NOTE — Telephone Encounter (Signed)
Pt requesting medication 

## 2018-10-27 NOTE — Telephone Encounter (Signed)
Please Advise   Patient is requesting a refill of the following medications: Requested Prescriptions   Pending Prescriptions Disp Refills  . meclizine (ANTIVERT) 25 MG tablet [Pharmacy Med Name: MECLIZINE 25 MG TABLET] 30 tablet 0    Sig: TAKE 1 TABLET (25 MG TOTAL) BY MOUTH 3 (THREE) TIMES DAILY AS NEEDED FOR DIZZINESS.    Date of patient request: 10/27/18 Last office visit: 07/17/18 Date of last refill: 04/19/18 Last refill amount: 30 tab Follow up time period per chart: 12/04/18

## 2018-10-27 NOTE — Telephone Encounter (Signed)
Please Advise   Patient is requesting a refill of the following medications: Requested Prescriptions   Pending Prescriptions Disp Refills  . meclizine (ANTIVERT) 25 MG tablet 30 tablet 0    Sig: Take 1 tablet (25 mg total) by mouth 3 (three) times daily as needed for dizziness.

## 2018-10-27 NOTE — Telephone Encounter (Signed)
Forwarding medication refill request to the clinical pool for review. 

## 2018-10-29 MED ORDER — MECLIZINE HCL 25 MG PO TABS: 25.0000 mg | ORAL_TABLET | Freq: Three times a day (TID) | ORAL | 0 refills | Status: DC | PRN

## 2018-11-01 ENCOUNTER — Other Ambulatory Visit: Payer: Self-pay | Admitting: Internal Medicine

## 2018-11-01 DIAGNOSIS — E1010 Type 1 diabetes mellitus with ketoacidosis without coma: Secondary | ICD-10-CM

## 2018-11-10 ENCOUNTER — Encounter: Payer: Self-pay | Admitting: Emergency Medicine

## 2018-11-11 ENCOUNTER — Other Ambulatory Visit: Payer: Self-pay | Admitting: Physician Assistant

## 2018-11-12 ENCOUNTER — Other Ambulatory Visit: Payer: Self-pay | Admitting: Physician Assistant

## 2018-11-14 NOTE — Telephone Encounter (Signed)
300 mg or 600 mg?

## 2018-11-17 ENCOUNTER — Ambulatory Visit: Payer: Managed Care, Other (non HMO) | Admitting: Internal Medicine

## 2018-11-17 ENCOUNTER — Encounter: Payer: Self-pay | Admitting: Internal Medicine

## 2018-11-17 ENCOUNTER — Other Ambulatory Visit: Payer: Self-pay

## 2018-11-17 VITALS — BP 130/80 | HR 90 | Ht 64.25 in | Wt 244.0 lb

## 2018-11-17 DIAGNOSIS — E1010 Type 1 diabetes mellitus with ketoacidosis without coma: Secondary | ICD-10-CM | POA: Diagnosis not present

## 2018-11-17 DIAGNOSIS — Z23 Encounter for immunization: Secondary | ICD-10-CM

## 2018-11-17 DIAGNOSIS — L03032 Cellulitis of left toe: Secondary | ICD-10-CM

## 2018-11-17 DIAGNOSIS — E7800 Pure hypercholesterolemia, unspecified: Secondary | ICD-10-CM | POA: Diagnosis not present

## 2018-11-17 DIAGNOSIS — Z6836 Body mass index (BMI) 36.0-36.9, adult: Secondary | ICD-10-CM

## 2018-11-17 LAB — LIPID PANEL
Cholesterol: 124 mg/dL (ref 0–200)
HDL: 45.3 mg/dL (ref >39.00)
LDL Cholesterol: 56 mg/dL (ref 0–99)
NonHDL: 78.2
Total CHOL/HDL Ratio: 3
Triglycerides: 109 mg/dL (ref 0.0–149.0)
VLDL: 21.8 mg/dL (ref 0.0–40.0)

## 2018-11-17 LAB — POCT GLYCOSYLATED HEMOGLOBIN (HGB A1C): Hemoglobin A1C: 6.6 % — AB (ref 4.0–5.6)

## 2018-11-17 LAB — MICROALBUMIN / CREATININE URINE RATIO
Creatinine,U: 28.1 mg/dL
Microalb Creat Ratio: 2.5 mg/g (ref 0.0–30.0)
Microalb, Ur: <0.7 mg/dL (ref 0.0–1.9)

## 2018-11-17 LAB — TSH: TSH: 5.33 u[IU]/mL — ABNORMAL HIGH (ref 0.35–4.50)

## 2018-11-17 MED ORDER — AMOXICILLIN-POT CLAVULANATE 875-125 MG PO TABS: 1.0000 | ORAL_TABLET | Freq: Two times a day (BID) | ORAL | 0 refills | Status: DC
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
## 2018-11-17 NOTE — Patient Instructions (Signed)
Please continue: - basal rates: 12 am: 2.95 11 am: 3.1 1 pm: 1.70 6 pm: 1.55  - ICR:   12 am: 4.0  7 am: 4.0 >> 3.5  11 am: 4.3 >> 3.8  3:30 pm: 4.0  4:30 pm: 4.3  5 pm: 3.6  - target: 105-115 - ISF:  12 am: 20 11 am: 30 3 pm: 20 - Insulin on Board: 3h - bolus wizard: on  Continue to bolus for coffee.  Please use a target of 150 when working outside or exercising.  Please do the following approximately 15 minutes before every meal: - Enter carbs (C) - Enter sugars (S) - Start insulin bolus (I)  Also continue: - Metformin ER 500 mg 2x a day   Please stop at the lab.  Please return in 3-4 months with your sugar log.

## 2018-11-18 LAB — COMPLETE METABOLIC PANEL WITH GFR
AG Ratio: 2.3 (calc) (ref 1.0–2.5)
ALT: 25 U/L (ref 6–29)
AST: 21 U/L (ref 10–35)
Albumin: 4.6 g/dL (ref 3.6–5.1)
Alkaline phosphatase (APISO): 76 U/L (ref 37–153)
BUN: 13 mg/dL (ref 7–25)
CO2: 25 mmol/L (ref 20–32)
Calcium: 9.6 mg/dL (ref 8.6–10.4)
Chloride: 105 mmol/L (ref 98–110)
Creat: 0.67 mg/dL (ref 0.50–1.05)
GFR, Est African American: 118 mL/min/{1.73_m2} (ref >=60)
GFR, Est Non African American: 102 mL/min/{1.73_m2} (ref >=60)
Globulin: 2 g/dL (calc) (ref 1.9–3.7)
Glucose, Bld: 100 mg/dL — ABNORMAL HIGH (ref 65–99)
Potassium: 4.5 mmol/L (ref 3.5–5.3)
Sodium: 141 mmol/L (ref 135–146)
Total Bilirubin: 0.5 mg/dL (ref 0.2–1.2)
Total Protein: 6.6 g/dL (ref 6.1–8.1)

## 2018-11-29 ENCOUNTER — Encounter: Payer: Self-pay | Admitting: Emergency Medicine

## 2018-11-29 ENCOUNTER — Ambulatory Visit (INDEPENDENT_AMBULATORY_CARE_PROVIDER_SITE_OTHER): Payer: Managed Care, Other (non HMO) | Admitting: Emergency Medicine

## 2018-11-29 ENCOUNTER — Other Ambulatory Visit: Payer: Self-pay

## 2018-11-29 VITALS — BP 142/83 | HR 96 | Temp 98.1°F | Ht 65.0 in | Wt 242.0 lb

## 2018-11-29 DIAGNOSIS — F3178 Bipolar disorder, in full remission, most recent episode mixed: Secondary | ICD-10-CM | POA: Diagnosis not present

## 2018-11-29 DIAGNOSIS — E1059 Type 1 diabetes mellitus with other circulatory complications: Secondary | ICD-10-CM | POA: Diagnosis not present

## 2018-11-29 DIAGNOSIS — K219 Gastro-esophageal reflux disease without esophagitis: Secondary | ICD-10-CM

## 2018-11-29 DIAGNOSIS — I10 Essential (primary) hypertension: Secondary | ICD-10-CM

## 2018-11-29 DIAGNOSIS — G4733 Obstructive sleep apnea (adult) (pediatric): Secondary | ICD-10-CM

## 2018-11-29 MED ORDER — LISINOPRIL 10 MG PO TABS: 10.0000 mg | ORAL_TABLET | Freq: Every day | ORAL | 3 refills | Status: DC

## 2018-11-29 NOTE — Patient Instructions (Addendum)
   If you have lab work done today you will be contacted with your lab results within the next 2 weeks.  If you have not heard from us then please contact us. The fastest way to get your results is to register for My Chart.   IF you received an x-ray today, you will receive an invoice from [facility]. Please contact [facility] at [phone] with questions or concerns regarding your invoice.   IF you received labwork today, you will receive an invoice from [facility]. Please contact [facility] at [phone] with questions or concerns regarding your invoice.   Our billing staff will not be able to assist you with questions regarding bills from these companies.  You will be contacted with the lab results as soon as they are available. The fastest way to get your results is to activate your My Chart account. Instructions are located on the last page of this paperwork. If you have not heard from us regarding the results in 2 weeks, please contact this office.     Diabetes Mellitus and Nutrition, Adult When you have diabetes (diabetes mellitus), it is very important to have healthy eating habits because your blood sugar (glucose) levels are greatly affected by what you eat and drink. Eating healthy foods in the appropriate amounts, at about the same times every day, can help you:  Control your blood glucose.  Lower your risk of heart disease.  Improve your blood pressure.  Reach or maintain a healthy weight. Every person with diabetes is different, and each person has different needs for a meal plan. Your health care provider may recommend that you work with a diet and nutrition specialist (dietitian) to make a meal plan that is best for you. Your meal plan may vary depending on factors such as:  The calories you need.  The medicines you take.  Your weight.  Your blood glucose, blood pressure, and cholesterol levels.  Your activity level.  Other health conditions  you have, such as heart or kidney disease. How do carbohydrates affect me? Carbohydrates, also called carbs, affect your blood glucose level more than any other type of food. Eating carbs naturally raises the amount of glucose in your blood. Carb counting is a method for keeping track of how many carbs you eat. Counting carbs is important to keep your blood glucose at a healthy level, especially if you use insulin or take certain oral diabetes medicines. It is important to know how many carbs you can safely have in each meal. This is different for every person. Your dietitian can help you calculate how many carbs you should have at each meal and for each snack. Foods that contain carbs include:  Bread, cereal, rice, pasta, and crackers.  Potatoes and corn.  Peas, beans, and lentils.  Milk and yogurt.  Fruit and juice.  Desserts, such as cakes, cookies, ice cream, and candy. How does alcohol affect me? Alcohol can cause a sudden decrease in blood glucose (hypoglycemia), especially if you use insulin or take certain oral diabetes medicines. Hypoglycemia can be a life-threatening condition. Symptoms of hypoglycemia (sleepiness, dizziness, and confusion) are similar to symptoms of having too much alcohol. If your health care provider says that alcohol is safe for you, follow these guidelines:  Limit alcohol intake to no more than 1 drink per day for nonpregnant women and 2 drinks per day for men. One drink equals 12 oz of beer, 5 oz of wine, or 1 oz of hard liquor.    Do not drink on an empty stomach.  Keep yourself hydrated with water, diet soda, or unsweetened iced tea.  Keep in mind that regular soda, juice, and other mixers may contain a lot of sugar and must be counted as carbs. What are tips for following this plan?  Reading food labels  Start by checking the serving size on the "Nutrition Facts" label of packaged foods and drinks. The amount of calories, carbs, fats, and other  nutrients listed on the label is based on one serving of the item. Many items contain more than one serving per package.  Check the total grams (g) of carbs in one serving. You can calculate the number of servings of carbs in one serving by dividing the total carbs by 15. For example, if a food has 30 g of total carbs, it would be equal to 2 servings of carbs.  Check the number of grams (g) of saturated and trans fats in one serving. Choose foods that have low or no amount of these fats.  Check the number of milligrams (mg) of salt (sodium) in one serving. Most people should limit total sodium intake to less than 2,300 mg per day.  Always check the nutrition information of foods labeled as "low-fat" or "nonfat". These foods may be higher in added sugar or refined carbs and should be avoided.  Talk to your dietitian to identify your daily goals for nutrients listed on the label. Shopping  Avoid buying canned, premade, or processed foods. These foods tend to be high in fat, sodium, and added sugar.  Shop around the outside edge of the grocery store. This includes fresh fruits and vegetables, bulk grains, fresh meats, and fresh dairy. Cooking  Use low-heat cooking methods, such as baking, instead of high-heat cooking methods like deep frying.  Cook using healthy oils, such as olive, canola, or sunflower oil.  Avoid cooking with butter, cream, or high-fat meats. Meal planning  Eat meals and snacks regularly, preferably at the same times every day. Avoid going long periods of time without eating.  Eat foods high in fiber, such as fresh fruits, vegetables, beans, and whole grains. Talk to your dietitian about how many servings of carbs you can eat at each meal.  Eat 4-6 ounces (oz) of lean protein each day, such as lean meat, chicken, fish, eggs, or tofu. One oz of lean protein is equal to: ? 1 oz of meat, chicken, or fish. ? 1 egg. ?  cup of tofu.  Eat some foods each day that contain  healthy fats, such as avocado, nuts, seeds, and fish. Lifestyle  Check your blood glucose regularly.  Exercise regularly as told by your health care provider. This may include: ? 150 minutes of moderate-intensity or vigorous-intensity exercise each week. This could be brisk walking, biking, or water aerobics. ? Stretching and doing strength exercises, such as yoga or weightlifting, at least 2 times a week.  Take medicines as told by your health care provider.  Do not use any products that contain nicotine or tobacco, such as cigarettes and e-cigarettes. If you need help quitting, ask your health care provider.  Work with a counselor or diabetes educator to identify strategies to manage stress and any emotional and social challenges. Questions to ask a health care provider  Do I need to meet with a diabetes educator?  Do I need to meet with a dietitian?  What number can I call if I have questions?  When are the best times to   check my blood glucose? Where to find more information:  American Diabetes Association: diabetes.org  Academy of Nutrition and Dietetics: www.eatright.org  National Institute of Diabetes and Digestive and Kidney Diseases (NIH): www.niddk.nih.gov Summary  A healthy meal plan will help you control your blood glucose and maintain a healthy lifestyle.  Working with a diet and nutrition specialist (dietitian) can help you make a meal plan that is best for you.  Keep in mind that carbohydrates (carbs) and alcohol have immediate effects on your blood glucose levels. It is important to count carbs and to use alcohol carefully. This information is not intended to replace advice given to you by your health care provider. Make sure you discuss any questions you have with your health care provider. Document Released: 10/22/2004 Document Revised: 01/07/2017 Document Reviewed: 03/01/2016 Elsevier Patient Education  2020 [employer]  
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
## 2018-12-04 ENCOUNTER — Ambulatory Visit: Payer: 59 | Admitting: Emergency Medicine

## 2018-12-21 ENCOUNTER — Ambulatory Visit: Payer: Managed Care, Other (non HMO) | Admitting: Physician Assistant

## 2018-12-21 ENCOUNTER — Telehealth: Payer: Self-pay | Admitting: Emergency Medicine

## 2018-12-21 ENCOUNTER — Telehealth: Payer: Self-pay | Admitting: Physician Assistant

## 2018-12-21 ENCOUNTER — Other Ambulatory Visit: Payer: Self-pay

## 2018-12-21 NOTE — Telephone Encounter (Signed)
Pt dropped off ada paperwork to be completed by provider. Paperwork placed in provider box at [place]

## 2018-12-21 NOTE — Telephone Encounter (Signed)
Patient had an appointment on 11/12 which was cancelled due to the provider. She is having severe panic attacks to the point that she is frozen and can't function. She would like to get back on the xanax. She has an appointment for 12/10. Please give her a call at [phone]

## 2018-12-22 ENCOUNTER — Other Ambulatory Visit: Payer: Self-pay | Admitting: Physician Assistant

## 2018-12-22 MED ORDER — ALPRAZOLAM 0.5 MG PO TABS: 0.2500 mg | ORAL_TABLET | Freq: Two times a day (BID) | ORAL | 0 refills | Status: DC | PRN

## 2018-12-22 NOTE — Telephone Encounter (Signed)
I'm fine w/ giving her xanax.  I know she rarely took it when she had it.  Let me know what pharm to send it to.

## 2018-12-22 NOTE — Telephone Encounter (Signed)
Rx sent 

## 2018-12-25 ENCOUNTER — Telehealth: Payer: Self-pay | Admitting: *Deleted

## 2018-12-25 NOTE — Telephone Encounter (Signed)
Called patient to let her know paper work is ready for pick up at check in.

## 2019-01-18 ENCOUNTER — Ambulatory Visit (INDEPENDENT_AMBULATORY_CARE_PROVIDER_SITE_OTHER): Payer: Managed Care, Other (non HMO) | Admitting: Physician Assistant

## 2019-01-18 ENCOUNTER — Encounter: Payer: Self-pay | Admitting: Physician Assistant

## 2019-01-18 ENCOUNTER — Other Ambulatory Visit: Payer: Self-pay

## 2019-01-18 DIAGNOSIS — F411 Generalized anxiety disorder: Secondary | ICD-10-CM

## 2019-01-18 DIAGNOSIS — F1021 Alcohol dependence, in remission: Secondary | ICD-10-CM

## 2019-01-18 DIAGNOSIS — F319 Bipolar disorder, unspecified: Secondary | ICD-10-CM | POA: Diagnosis not present

## 2019-01-18 DIAGNOSIS — R413 Other amnesia: Secondary | ICD-10-CM | POA: Diagnosis not present

## 2019-01-18 MED ORDER — QUETIAPINE FUMARATE 400 MG PO TABS: 400.0000 mg | ORAL_TABLET | Freq: Every day | ORAL | 1 refills | Status: DC

## 2019-01-18 MED ORDER — GABAPENTIN 300 MG PO CAPS: ORAL_CAPSULE | ORAL | 1 refills | Status: DC

## 2019-01-18 NOTE — Progress Notes (Signed)
[facility]  Patient ID: Karla Rodriguez,  MRN: [id number]  PCP: Horald Pollen, MD  Date of Evaluation: 01/18/2019 Time spent:30 minutes  Chief Complaint:  Chief Complaint    Anxiety; Depression; Follow-up      HISTORY/CURRENT STATUS: HPI Has been more anxious.  Has had 3 severe panic attacks in past month or so.  They were accompanied with shortness of breath, chest tightness, sweating and palpitations.  They went away within a few hours.  With the political climate right now, the pandemic, a coworker committed suicide, these have all been triggers.  She's more edgy, having word recall probs for the past month or so per her wife, who is an NP in mental health.  Patient feels a generalized sense of anxiety all throughout the day.  "There is just so much going on.  I feel like I am wound up all the time.  I have racing thoughts at times.  I have only taken 2 Xanax since she sent them in last month.  They did help.  But because of my history of being very careful with taking them.  I have started getting up, getting dressed, taking the dogs for walks during the middle of the day, which seems to help her mood a little bit.  Still working from home so it is been easy just to not have a routine or exercise at all.  I hope this will help."  She has felt more tired and wanting to lay around a lot.  She has not taken care of her yard like she normally does, over the past couple of months she has just let things go.  Sleeps fairly well.  Not enjoying things like she was.  She is more tearful which is not like her at all.  Denies suicidal or homicidal thoughts.  No increased energy with decreased need for sleep, no impulsivity or risky behavior, no increased libido or spending.  No hallucinations.  No irritability that is worse than normal and gets warranted by a trigger if she does feel irritable.  Review of Systems  Constitutional: Positive for malaise/fatigue.  HENT: Negative.    Eyes: Negative.   Respiratory: Negative.   Cardiovascular: Negative.   Gastrointestinal: Negative.   Genitourinary: Negative.   Musculoskeletal: Negative.   Skin: Negative.   Neurological: Negative.   Endo/Heme/Allergies: Negative.   Psychiatric/Behavioral: Positive for depression and memory loss. Negative for hallucinations, substance abuse and suicidal ideas. The patient is nervous/anxious. The patient does not have insomnia.     Individual Medical History/ Review of Systems: Changes? :No    Past medications for mental health diagnoses include: Xanax, Lexapro, Rozerem, Saphris, Lamictal, Wellbutrin, Ambien, Nuvigil, Risperdal, Celexa, Seroquel, Latuda, Strattera, lithium  Allergies: Patient has no known allergies.  Current Medications:  Current Outpatient Medications:  .  ALPRAZolam (XANAX) 0.5 MG tablet, Take 0.5-1 tablets (0.25-0.5 mg total) by mouth 2 (two) times daily as needed for anxiety., Disp: 20 tablet, Rfl: 0 .  b complex vitamins capsule, Take 1 capsule by mouth daily., Disp: , Rfl:  .  CANNABIDIOL PO, Take by mouth., Disp: , Rfl:  .  cetirizine (ZYRTEC) 10 MG tablet, Take 10 mg by mouth daily., Disp: , Rfl:  .  Cholecalciferol (VITAMIN D PO), Take by mouth., Disp: , Rfl:  .  glucagon (GLUCAGON EMERGENCY) 1 MG injection, Inject 1 mg into the muscle once as needed for up to 1 dose., Disp: 1 each, Rfl: 12 .  insulin lispro (HUMALOG)  100 UNIT/ML injection, VIA INSULIN PUMP - 150 UNITS PER DAY, Disp: 150 mL, Rfl: 3 .  lamoTRIgine (LAMICTAL) 150 MG tablet, TAKE 2 TABLETS AT BEDTIME, Disp: 180 tablet, Rfl: 3 .  lisinopril (ZESTRIL) 10 MG tablet, Take 1 tablet (10 mg total) by mouth daily., Disp: 90 tablet, Rfl: 3 .  meclizine (ANTIVERT) 25 MG tablet, Take 1 tablet (25 mg total) by mouth 3 (three) times daily as needed for dizziness., Disp: 30 tablet, Rfl: 0 .  Melatonin 10 MG TABS, Take 10 mg by mouth at bedtime as needed., Disp: , Rfl:  .  metFORMIN (GLUCOPHAGE-XR) 500 MG  24 hr tablet, Take 1 tablet (500 mg total) by mouth 2 (two) times daily., Disp: 180 tablet, Rfl: 3 .  modafinil (PROVIGIL) 200 MG tablet, Take 1 tablet (200 mg total) by mouth daily., Disp: 30 tablet, Rfl: 5 .  montelukast (SINGULAIR) 10 MG tablet, TAKE 1 TABLET BY MOUTH EVERYDAY AT BEDTIME, Disp: 90 tablet, Rfl: 1 .  ONETOUCH VERIO test strip, USE AS DIRECTED. TESTING FREQUENCY: 4-6X/DAILY. (DX:E10.65), Disp: 500 each, Rfl: 5 .  pantoprazole (PROTONIX) 40 MG tablet, TAKE 1 TABLET DAILY (NEED OFFICE VISIT FOR REFILLS), Disp: 90 tablet, Rfl: 3 .  rosuvastatin (CRESTOR) 20 MG tablet, TAKE 1 TABLET AT BEDTIME, Disp: 90 tablet, Rfl: 3 .  TURMERIC PO, Take by mouth., Disp: , Rfl:  .  gabapentin (NEURONTIN) 300 MG capsule, 1 po qhs for 4 nights, then may increase to bid if not improved., Disp: 60 capsule, Rfl: 1 .  QUEtiapine (SEROQUEL) 400 MG tablet, Take 1 tablet (400 mg total) by mouth at bedtime., Disp: 30 tablet, Rfl: 1 Medication Side Effects: none  Family Medical/ Social History: Changes? No  MENTAL HEALTH EXAM:  There were no vitals taken for this visit.There is no height or weight on file to calculate BMI.  General Appearance: Casual, Neat, Well Groomed and Obese  Eye Contact:  Good  Speech:  Clear and Coherent  Volume:  Normal  Mood:  Anxious and Depressed  Affect:  Tearful and Anxious  Thought Process:  Goal Directed and Descriptions of Associations: Intact  Orientation:  Full (Time, Place, and Person)  Thought Content: Logical   Suicidal Thoughts:  No  Homicidal Thoughts:  No  Memory:  Immediate;   Fair o/w nl  Judgement:  Good  Insight:  Good  Psychomotor Activity:  Normal  Concentration:  Concentration: Good and Attention Span: Good  Recall:  Good  Fund of Knowledge: Good  Language: Good  Assets:  Desire for Improvement  ADL's:  Intact  Cognition: WNL  Prognosis:  Good    DIAGNOSES:    ICD-10-CM   1. Bipolar I disorder ([place])  F31.9   2. GAD (generalized anxiety  disorder)  F41.1   3. Memory difficulties  R41.3   4. Alcoholism in recovery [facility])  F10.21     Receiving Psychotherapy: No    RECOMMENDATIONS:  PDMP was reviewed. I spent 30 minutes with her face-to-face and 50% of that time was in counseling concerning her diagnosis and treatment. May increase the Modafanil to 300 mg prn, try to stay at 100 to 200 mg.  This may help with her word finding as well as giving her more energy, but it might increase anxiety as well.  She understands and will be cautious.  Increase Seroquel to 400 mg nightly. Continue Lamictal 150 mg, 2 p.o. nightly. Start gabapentin 300 mg 1 nightly for 4 nights and then may increase to 1  p.o. twice daily.  This will help prevent and treat the anxiety. Continue Xanax 0.5 mg, 1/2-1 twice daily as needed.  She is taking it as sparingly as possible. Recommend counseling. Return in 4 to 6 weeks.   Donnal Moat, PA-C

## 2019-02-05 ENCOUNTER — Telehealth: Payer: Self-pay | Admitting: Physician Assistant

## 2019-02-05 NOTE — Telephone Encounter (Signed)
Sinead called to request refill of her modafinil.  Since you told her she could 1 1/2 of the 200 mg for a total of 300mg , she is out early and pharmacy won't fill it early.  Please send in new prescription with new dose.  [facility] [address].  Appt 02/27/19

## 2019-02-06 ENCOUNTER — Other Ambulatory Visit: Payer: Self-pay | Admitting: Psychiatry

## 2019-02-06 MED ORDER — MODAFINIL 200 MG PO TABS: 300.0000 mg | ORAL_TABLET | Freq: Every day | ORAL | 1 refills | Status: DC

## 2019-02-06 NOTE — Telephone Encounter (Signed)
Prescription quantity corrected and prescription was sent.

## 2019-02-07 NOTE — Telephone Encounter (Signed)
reviewed

## 2019-02-19 ENCOUNTER — Telehealth: Payer: Self-pay

## 2019-02-19 NOTE — Telephone Encounter (Signed)
Forms for pump supplies filled out, signed by Dr. Cruzita Lederer and faxed to [employer] with confirmation.

## 2019-02-20 ENCOUNTER — Other Ambulatory Visit: Payer: Self-pay | Admitting: Physician Assistant

## 2019-02-22 ENCOUNTER — Other Ambulatory Visit: Payer: Self-pay | Admitting: Emergency Medicine

## 2019-02-22 DIAGNOSIS — R05 Cough: Secondary | ICD-10-CM

## 2019-02-22 DIAGNOSIS — R059 Cough, unspecified: Secondary | ICD-10-CM

## 2019-02-22 DIAGNOSIS — J309 Allergic rhinitis, unspecified: Secondary | ICD-10-CM

## 2019-02-27 ENCOUNTER — Ambulatory Visit (INDEPENDENT_AMBULATORY_CARE_PROVIDER_SITE_OTHER): Payer: Managed Care, Other (non HMO) | Admitting: Physician Assistant

## 2019-02-27 ENCOUNTER — Other Ambulatory Visit: Payer: Self-pay

## 2019-02-27 ENCOUNTER — Encounter: Payer: Self-pay | Admitting: Physician Assistant

## 2019-02-27 DIAGNOSIS — F900 Attention-deficit hyperactivity disorder, predominantly inattentive type: Secondary | ICD-10-CM | POA: Diagnosis not present

## 2019-02-27 DIAGNOSIS — F1021 Alcohol dependence, in remission: Secondary | ICD-10-CM | POA: Diagnosis not present

## 2019-02-27 DIAGNOSIS — R413 Other amnesia: Secondary | ICD-10-CM

## 2019-02-27 DIAGNOSIS — F411 Generalized anxiety disorder: Secondary | ICD-10-CM

## 2019-02-27 DIAGNOSIS — F319 Bipolar disorder, unspecified: Secondary | ICD-10-CM

## 2019-02-27 MED ORDER — GABAPENTIN 100 MG PO CAPS: 100.0000 mg | ORAL_CAPSULE | Freq: Two times a day (BID) | ORAL | 0 refills | Status: DC | PRN

## 2019-02-27 MED ORDER — MODAFINIL 200 MG PO TABS: 300.0000 mg | ORAL_TABLET | Freq: Every day | ORAL | 5 refills | Status: DC

## 2019-02-27 NOTE — Progress Notes (Signed)
[facility]  Patient ID: Karla Rodriguez,  MRN: [id number]  PCP: Horald Pollen, MD  Date of Evaluation: 02/27/2019 Time spent:20 minutes  Chief Complaint:  Chief Complaint    Anxiety; Insomnia; Follow-up      HISTORY/CURRENT STATUS: HPI for routine med check.  Patient states she is feeling much better.  The anxiety is almost gone.  Increasing the Seroquel really helped a lot.  She took the gabapentin only a couple of times and it knocked her out.  She was not able to tolerate it, but states it did help with the anxiety.  She is not having panic attacks like she was or feeling like her heart is going to beat out of her chest.  "I feel a lot better.  I like the way the medicines are working now."  The increase of modafinil has helped a lot with her focus.  Attention span is a lot longer.  She feels it also gives her boost and kind of wakes her up and gets her going every day.  Patient denies loss of interest in usual activities and is able to enjoy things.  Denies decreased energy or motivation.  Appetite has not changed.  No extreme sadness, tearfulness, or feelings of hopelessness.  Denies any changes in concentration, making decisions or remembering things.  Denies suicidal or homicidal thoughts.  Patient denies increased energy with decreased need for sleep, no increased talkativeness, no racing thoughts, no impulsivity or risky behaviors, no increased spending, no increased libido, no grandiosity.  Denies dizziness, syncope, seizures, numbness, tingling, tremor, tics, unsteady gait, slurred speech, confusion. Denies muscle or joint pain, stiffness, or dystonia.  Individual Medical History/ Review of Systems: Changes? :No    Past medications for mental health diagnoses include: Xanax, Lexapro, Rozerem, Saphris, Lamictal, Wellbutrin, Ambien, Nuvigil, Risperdal, Celexa, Seroquel, Latuda, Strattera, lithium  Allergies: Patient has no known allergies.  Current  Medications:  Current Outpatient Medications:  .  ALPRAZolam (XANAX) 0.5 MG tablet, Take 0.5-1 tablets (0.25-0.5 mg total) by mouth 2 (two) times daily as needed for anxiety., Disp: 20 tablet, Rfl: 0 .  b complex vitamins capsule, Take 1 capsule by mouth daily., Disp: , Rfl:  .  CANNABIDIOL PO, Take by mouth., Disp: , Rfl:  .  cetirizine (ZYRTEC) 10 MG tablet, Take 10 mg by mouth daily., Disp: , Rfl:  .  Cholecalciferol (VITAMIN D PO), Take by mouth., Disp: , Rfl:  .  glucagon (GLUCAGON EMERGENCY) 1 MG injection, Inject 1 mg into the muscle once as needed for up to 1 dose., Disp: 1 each, Rfl: 12 .  insulin lispro (HUMALOG) 100 UNIT/ML injection, VIA INSULIN PUMP - 150 UNITS PER DAY, Disp: 150 mL, Rfl: 3 .  lamoTRIgine (LAMICTAL) 150 MG tablet, TAKE 2 TABLETS AT BEDTIME, Disp: 180 tablet, Rfl: 3 .  lisinopril (ZESTRIL) 10 MG tablet, Take 1 tablet (10 mg total) by mouth daily., Disp: 90 tablet, Rfl: 3 .  meclizine (ANTIVERT) 25 MG tablet, Take 1 tablet (25 mg total) by mouth 3 (three) times daily as needed for dizziness., Disp: 30 tablet, Rfl: 0 .  Melatonin 10 MG TABS, Take 10 mg by mouth at bedtime as needed., Disp: , Rfl:  .  metFORMIN (GLUCOPHAGE-XR) 500 MG 24 hr tablet, Take 1 tablet (500 mg total) by mouth 2 (two) times daily., Disp: 180 tablet, Rfl: 3 .  modafinil (PROVIGIL) 200 MG tablet, Take 1.5 tablets (300 mg total) by mouth daily., Disp: 45 tablet, Rfl: 5 .  montelukast (SINGULAIR) 10 MG tablet, TAKE 1 TABLET BY MOUTH EVERYDAY AT BEDTIME, Disp: 90 tablet, Rfl: 1 .  ONETOUCH VERIO test strip, USE AS DIRECTED. TESTING FREQUENCY: 4-6X/DAILY. (DX:E10.65), Disp: 500 each, Rfl: 5 .  pantoprazole (PROTONIX) 40 MG tablet, TAKE 1 TABLET DAILY (NEED OFFICE VISIT FOR REFILLS), Disp: 90 tablet, Rfl: 3 .  QUEtiapine (SEROQUEL) 400 MG tablet, TAKE 1 TABLET (400 MG TOTAL) BY MOUTH AT BEDTIME., Disp: 90 tablet, Rfl: 0 .  rosuvastatin (CRESTOR) 20 MG tablet, TAKE 1 TABLET AT BEDTIME, Disp: 90 tablet,  Rfl: 3 .  TURMERIC PO, Take by mouth., Disp: , Rfl:  .  gabapentin (NEURONTIN) 100 MG capsule, Take 1 capsule (100 mg total) by mouth 2 (two) times daily as needed., Disp: 60 capsule, Rfl: 0 Medication Side Effects: none  Family Medical/ Social History: Changes? No  MENTAL HEALTH EXAM:  There were no vitals taken for this visit.There is no height or weight on file to calculate BMI.  General Appearance: Casual, Neat, Well Groomed and Obese  Eye Contact:  Good  Speech:  Clear and Coherent  Volume:  Normal  Mood:  Euthymic  Affect:  Appropriate  Thought Process:  Goal Directed and Descriptions of Associations: Intact  Orientation:  Full (Time, Place, and Person)  Thought Content: Logical   Suicidal Thoughts:  No  Homicidal Thoughts:  No  Memory:  WNL  Judgement:  Good  Insight:  Good  Psychomotor Activity:  Normal  Concentration:  Concentration: Good  Recall:  Good  Fund of Knowledge: Good  Language: Good  Assets:  Desire for Improvement  ADL's:  Intact  Cognition: WNL  Prognosis:  Good    DIAGNOSES:    ICD-10-CM   1. Bipolar I disorder ([place])  F31.9   2. GAD (generalized anxiety disorder)  F41.1   3. Attention deficit hyperactivity disorder (ADHD), predominantly inattentive type  F90.0   4. Alcoholism in recovery ([place])  F10.21   5. Memory difficulties  R41.3     Receiving Psychotherapy: No    RECOMMENDATIONS:  I am glad she is doing better. I spent 20 minutes with her. PDMP was reviewed. Since she responded well to the gabapentin I will give her another prescription of that but at a much lower dose.  She can take it as needed for anxiety. Start gabapentin 100 mg, 1 p.o. twice daily as needed anxiety. Continue Xanax 0.5 mg 1/2-1 twice daily as needed.  She only takes this in very rare cases. Continue Lamictal 150 mg, 1 p.o. twice daily. Continue modafinil 200 mg, 1.5 pills daily. Continue Seroquel 400 mg nightly. Continue melatonin 10 mg nightly as needed  sleep. Return in 6 months.   Donnal Moat, PA-C

## 2019-03-08 ENCOUNTER — Telehealth: Payer: Self-pay

## 2019-03-08 NOTE — Telephone Encounter (Signed)
Renewal Prior authorization submitted for Modafinil 200 mg 1.5 tablets daily approved with [employer] [place] # [id number] Effective 03/07/2019-03/07/2020  ID# [id number]

## 2019-03-15 ENCOUNTER — Ambulatory Visit: Payer: Managed Care, Other (non HMO) | Admitting: Physician Assistant

## 2019-03-16 ENCOUNTER — Encounter: Payer: Self-pay | Admitting: Emergency Medicine

## 2019-03-16 MED ORDER — ROSUVASTATIN CALCIUM 20 MG PO TABS: 20.0000 mg | ORAL_TABLET | Freq: Every day | ORAL | 1 refills | Status: DC

## 2019-03-20 ENCOUNTER — Ambulatory Visit: Payer: Managed Care, Other (non HMO) | Admitting: Internal Medicine

## 2019-03-26 ENCOUNTER — Ambulatory Visit: Payer: Managed Care, Other (non HMO) | Admitting: Internal Medicine

## 2019-04-23 ENCOUNTER — Ambulatory Visit: Payer: 59 | Admitting: Internal Medicine

## 2019-04-23 ENCOUNTER — Other Ambulatory Visit: Payer: Self-pay

## 2019-04-23 ENCOUNTER — Encounter: Payer: Self-pay | Admitting: Internal Medicine

## 2019-04-23 VITALS — BP 142/90 | HR 102 | Ht 65.0 in | Wt 246.0 lb

## 2019-04-23 DIAGNOSIS — E1010 Type 1 diabetes mellitus with ketoacidosis without coma: Secondary | ICD-10-CM | POA: Diagnosis not present

## 2019-04-23 DIAGNOSIS — Z6836 Body mass index (BMI) 36.0-36.9, adult: Secondary | ICD-10-CM

## 2019-04-23 DIAGNOSIS — R7989 Other specified abnormal findings of blood chemistry: Secondary | ICD-10-CM

## 2019-04-23 DIAGNOSIS — R5382 Chronic fatigue, unspecified: Secondary | ICD-10-CM | POA: Diagnosis not present

## 2019-04-23 DIAGNOSIS — E7800 Pure hypercholesterolemia, unspecified: Secondary | ICD-10-CM

## 2019-04-23 LAB — POCT GLYCOSYLATED HEMOGLOBIN (HGB A1C): Hemoglobin A1C: 6.6 % — AB (ref 4.0–5.6)

## 2019-04-23 NOTE — Addendum Note (Signed)
Addended by: Cardell Peach I on: 04/23/2019 12:53 PM   Modules accepted: Orders

## 2019-04-23 NOTE — Patient Instructions (Addendum)
Please continue: - basal rates: 12 am: 2.95 >> 3.1 11 am: 3.1 1 pm: 1.70 6 pm: 1.55  - ICR:   12 am: 3.7  7 am: 3.5  11 am: 4.3  3:30 pm: 4.0  4:30 pm: 4.3  5 pm: 3.6 >> 3.4 - target: 105-115 - ISF:  12 am: 20 11 am: 30 3 pm: 20 - Insulin on Board: 2:45 h - bolus wizard: on  Continue to bolus for coffee.  Please use a target of 150 when working outside or exercising.  Please do the following approximately 15 minutes before every meal: - Enter carbs (C) - Enter sugars (S) - Start insulin bolus (I)  Also continue: - Metformin ER 500 mg 2x a day   Please stop at the lab.  Please return in 3-4 months with your sugar log.
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
## 2019-04-26 ENCOUNTER — Other Ambulatory Visit (INDEPENDENT_AMBULATORY_CARE_PROVIDER_SITE_OTHER): Payer: 59

## 2019-04-26 ENCOUNTER — Other Ambulatory Visit: Payer: Self-pay

## 2019-04-26 DIAGNOSIS — R7989 Other specified abnormal findings of blood chemistry: Secondary | ICD-10-CM

## 2019-04-26 DIAGNOSIS — R5382 Chronic fatigue, unspecified: Secondary | ICD-10-CM

## 2019-04-26 LAB — VITAMIN B12: Vitamin B-12: 378 pg/mL (ref 211–911)

## 2019-04-26 LAB — CBC
HCT: 39.1 % (ref 36.0–46.0)
Hemoglobin: 13.1 g/dL (ref 12.0–15.0)
MCHC: 33.5 g/dL (ref 30.0–36.0)
MCV: 87.4 fl (ref 78.0–100.0)
Platelets: 218 10*3/uL (ref 150.0–400.0)
RBC: 4.47 Mil/uL (ref 3.87–5.11)
RDW: 13.8 % (ref 11.5–15.5)
WBC: 6 10*3/uL (ref 4.0–10.5)

## 2019-04-26 LAB — T4, FREE: Free T4: 0.82 ng/dL (ref 0.60–1.60)

## 2019-04-26 LAB — TSH: TSH: 4.4 u[IU]/mL (ref 0.35–4.50)

## 2019-04-26 LAB — VITAMIN D 25 HYDROXY (VIT D DEFICIENCY, FRACTURES): VITD: 40.41 ng/mL (ref 30.00–100.00)

## 2019-04-26 LAB — T3, FREE: T3, Free: 3.7 pg/mL (ref 2.3–4.2)

## 2019-05-20 ENCOUNTER — Other Ambulatory Visit: Payer: Self-pay | Admitting: Physician Assistant

## 2019-05-30 ENCOUNTER — Ambulatory Visit: Payer: Managed Care, Other (non HMO) | Admitting: Emergency Medicine

## 2019-05-30 ENCOUNTER — Encounter: Payer: Self-pay | Admitting: Emergency Medicine

## 2019-05-30 ENCOUNTER — Other Ambulatory Visit: Payer: Self-pay

## 2019-05-30 VITALS — BP 127/77 | HR 90 | Temp 98.0°F | Ht 64.0 in | Wt 244.8 lb

## 2019-05-30 DIAGNOSIS — G4733 Obstructive sleep apnea (adult) (pediatric): Secondary | ICD-10-CM | POA: Diagnosis not present

## 2019-05-30 DIAGNOSIS — F3178 Bipolar disorder, in full remission, most recent episode mixed: Secondary | ICD-10-CM

## 2019-05-30 DIAGNOSIS — M549 Dorsalgia, unspecified: Secondary | ICD-10-CM | POA: Diagnosis not present

## 2019-05-30 DIAGNOSIS — E1059 Type 1 diabetes mellitus with other circulatory complications: Secondary | ICD-10-CM | POA: Diagnosis not present

## 2019-05-30 DIAGNOSIS — I10 Essential (primary) hypertension: Secondary | ICD-10-CM

## 2019-05-30 DIAGNOSIS — I152 Hypertension secondary to endocrine disorders: Secondary | ICD-10-CM

## 2019-05-30 MED ORDER — METAXALONE 800 MG PO TABS: 800.0000 mg | ORAL_TABLET | Freq: Three times a day (TID) | ORAL | 3 refills | Status: DC | PRN

## 2019-05-30 NOTE — Patient Instructions (Addendum)
If you have lab work done today you will be contacted with your lab results within the next 2 weeks.  If you have not heard from [place] then please contact us. The fastest way to get your results is to register for My Chart.   IF you received an x-ray today, you will receive an invoice from [facility]. Please contact [facility] at [phone] with questions or concerns regarding your invoice.   IF you received labwork today, you will receive an invoice from [facility]. Please contact [facility] at [phone] with questions or concerns regarding your invoice.   Our billing staff will not be able to assist you with questions regarding bills from these companies.  You will be contacted with the lab results as soon as they are available. The fastest way to get your results is to activate your My Chart account. Instructions are located on the last page of this paperwork. If you have not heard from [place] regarding the results in 2 weeks, please contact this office.      Diabetes Mellitus and Nutrition, Adult When you have diabetes (diabetes mellitus), it is very important to have healthy eating habits because your blood sugar (glucose) levels are greatly affected by what you eat and drink. Eating healthy foods in the appropriate amounts, at about the same times every day, can help you:  Control your blood glucose.  Lower your risk of heart disease.  Improve your blood pressure.  Reach or maintain a healthy weight. Every person with diabetes is different, and each person has different needs for a meal plan. Your health care provider may recommend that you work with a diet and nutrition specialist (dietitian) to make a meal plan that is best for you. Your meal plan may vary depending on factors such as:  The calories you need.  The medicines you take.  Your weight.  Your blood glucose, blood pressure, and cholesterol levels.  Your activity level.  Other health  conditions you have, such as heart or kidney disease. How do carbohydrates affect me? Carbohydrates, also called carbs, affect your blood glucose level more than any other type of food. Eating carbs naturally raises the amount of glucose in your blood. Carb counting is a method for keeping track of how many carbs you eat. Counting carbs is important to keep your blood glucose at a healthy level, especially if you use insulin or take certain oral diabetes medicines. It is important to know how many carbs you can safely have in each meal. This is different for every person. Your dietitian can help you calculate how many carbs you should have at each meal and for each snack. Foods that contain carbs include:  Bread, cereal, rice, pasta, and crackers.  Potatoes and corn.  Peas, beans, and lentils.  Milk and yogurt.  Fruit and juice.  Desserts, such as cakes, cookies, ice cream, and candy. How does alcohol affect me? Alcohol can cause a sudden decrease in blood glucose (hypoglycemia), especially if you use insulin or take certain oral diabetes medicines. Hypoglycemia can be a life-threatening condition. Symptoms of hypoglycemia (sleepiness, dizziness, and confusion) are similar to symptoms of having too much alcohol. If your health care provider says that alcohol is safe for you, follow these guidelines:  Limit alcohol intake to no more than 1 drink per day for nonpregnant women and 2 drinks per day for men. One drink equals 12 oz of beer, 5 oz of wine, or 1 oz of hard  liquor.  Do not drink on an empty stomach.  Keep yourself hydrated with water, diet soda, or unsweetened iced tea.  Keep in mind that regular soda, juice, and other mixers may contain a lot of sugar and must be counted as carbs. What are tips for following this plan?  Reading food labels  Start by checking the serving size on the "Nutrition Facts" label of packaged foods and drinks. The amount of calories, carbs, fats, and  other nutrients listed on the label is based on one serving of the item. Many items contain more than one serving per package.  Check the total grams (g) of carbs in one serving. You can calculate the number of servings of carbs in one serving by dividing the total carbs by 15. For example, if a food has 30 g of total carbs, it would be equal to 2 servings of carbs.  Check the number of grams (g) of saturated and trans fats in one serving. Choose foods that have low or no amount of these fats.  Check the number of milligrams (mg) of salt (sodium) in one serving. Most people should limit total sodium intake to less than 2,300 mg per day.  Always check the nutrition information of foods labeled as "low-fat" or "nonfat". These foods may be higher in added sugar or refined carbs and should be avoided.  Talk to your dietitian to identify your daily goals for nutrients listed on the label. Shopping  Avoid buying canned, premade, or processed foods. These foods tend to be high in fat, sodium, and added sugar.  Shop around the outside edge of the grocery store. This includes fresh fruits and vegetables, bulk grains, fresh meats, and fresh dairy. Cooking  Use low-heat cooking methods, such as baking, instead of high-heat cooking methods like deep frying.  Cook using healthy oils, such as olive, canola, or sunflower oil.  Avoid cooking with butter, cream, or high-fat meats. Meal planning  Eat meals and snacks regularly, preferably at the same times every day. Avoid going long periods of time without eating.  Eat foods high in fiber, such as fresh fruits, vegetables, beans, and whole grains. Talk to your dietitian about how many servings of carbs you can eat at each meal.  Eat 4-6 ounces (oz) of lean protein each day, such as lean meat, chicken, fish, eggs, or tofu. One oz of lean protein is equal to: ? 1 oz of meat, chicken, or fish. ? 1 egg. ?  cup of tofu.  Eat some foods each day that  contain healthy fats, such as avocado, nuts, seeds, and fish. Lifestyle  Check your blood glucose regularly.  Exercise regularly as told by your health care provider. This may include: ? 150 minutes of moderate-intensity or vigorous-intensity exercise each week. This could be brisk walking, biking, or water aerobics. ? Stretching and doing strength exercises, such as yoga or weightlifting, at least 2 times a week.  Take medicines as told by your health care provider.  Do not use any products that contain nicotine or tobacco, such as cigarettes and e-cigarettes. If you need help quitting, ask your health care provider.  Work with a Social worker or diabetes educator to identify strategies to manage stress and any emotional and social challenges. Questions to ask a health care provider  Do I need to meet with a diabetes educator?  Do I need to meet with a dietitian?  What number can I call if I have questions?  When are the best  times to check my blood glucose? Where to find more information:  American Diabetes Association: diabetes.org  Academy of Nutrition and Dietetics: www.eatright.[employer] of Diabetes and Digestive and Kidney Diseases (NIH): [employer] Summary  A healthy meal plan will help you control your blood glucose and maintain a healthy lifestyle.  Working with a diet and nutrition specialist (dietitian) can help you make a meal plan that is best for you.  Keep in mind that carbohydrates (carbs) and alcohol have immediate effects on your blood glucose levels. It is important to count carbs and to use alcohol carefully. This information is not intended to replace advice given to you by your health care provider. Make sure you discuss any questions you have with your health care provider. Document Revised: 01/07/2017 Document Reviewed: 03/01/2016 Elsevier Patient Education  2020 [employer].
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
## 2019-06-17 ENCOUNTER — Other Ambulatory Visit: Payer: Self-pay | Admitting: Internal Medicine

## 2019-08-24 ENCOUNTER — Ambulatory Visit: Payer: 59 | Admitting: Internal Medicine

## 2019-08-27 ENCOUNTER — Other Ambulatory Visit: Payer: Self-pay

## 2019-08-27 ENCOUNTER — Ambulatory Visit (INDEPENDENT_AMBULATORY_CARE_PROVIDER_SITE_OTHER): Payer: 59 | Admitting: Physician Assistant

## 2019-08-27 ENCOUNTER — Encounter: Payer: Self-pay | Admitting: Physician Assistant

## 2019-08-27 DIAGNOSIS — F319 Bipolar disorder, unspecified: Secondary | ICD-10-CM | POA: Diagnosis not present

## 2019-08-27 DIAGNOSIS — F411 Generalized anxiety disorder: Secondary | ICD-10-CM | POA: Diagnosis not present

## 2019-08-27 DIAGNOSIS — F900 Attention-deficit hyperactivity disorder, predominantly inattentive type: Secondary | ICD-10-CM

## 2019-08-27 MED ORDER — QUETIAPINE FUMARATE 400 MG PO TABS: 400.0000 mg | ORAL_TABLET | Freq: Every day | ORAL | 1 refills | Status: DC

## 2019-08-27 MED ORDER — MODAFINIL 200 MG PO TABS: 300.0000 mg | ORAL_TABLET | Freq: Every day | ORAL | 5 refills | Status: DC

## 2019-08-27 MED ORDER — ALPRAZOLAM 0.5 MG PO TABS: 0.2500 mg | ORAL_TABLET | Freq: Two times a day (BID) | ORAL | 0 refills | Status: DC | PRN

## 2019-08-27 NOTE — Progress Notes (Signed)
[facility]  Patient ID: Karla Rodriguez,  MRN: [id number]  PCP: Horald Pollen, MD  Date of Evaluation: 08/27/2019 Time spent:20 minutes  Chief Complaint:  Chief Complaint    Anxiety; Depression      HISTORY/CURRENT STATUS: HPI for routine med check.  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  She has started trying to go out more often to get ready for going back to work.  She asked for a few Xanax to get her through this time.  She rarely takes them.  Patient denies loss of interest in usual activities and is able to enjoy things.  Denies decreased energy or motivation.  Appetite has not changed.  No extreme sadness, tearfulness, or feelings of hopelessness.  Denies any changes in concentration, making decisions or remembering things.  Sleeps pretty well most of the time.  Denies suicidal or homicidal thoughts.  Patient denies increased energy with decreased need for sleep, no increased talkativeness, no racing thoughts, no impulsivity or risky behaviors, no increased spending, no increased libido, no grandiosity, no paranoia.  Denies dizziness, syncope, seizures, numbness, tingling, tremor, tics, unsteady gait, slurred speech, confusion. Denies muscle or joint pain, stiffness, or dystonia.  Individual Medical History/ Review of Systems: Changes? :No    Past medications for mental health diagnoses include: Xanax, Lexapro, Rozerem, Saphris, Lamictal, Wellbutrin, Ambien, Nuvigil, Risperdal, Celexa, Seroquel, Latuda, Strattera, lithium  Allergies: Patient has no known allergies.  Current Medications:  Current Outpatient Medications:  .  ALPRAZolam (XANAX) 0.5 MG tablet, Take 0.5-1 tablets (0.25-0.5 mg total) by mouth 2 (two) times daily as needed for anxiety., Disp: 20 tablet, Rfl: 0 .  b complex vitamins capsule, Take 1 capsule by mouth  daily., Disp: , Rfl:  .  cetirizine (ZYRTEC) 10 MG tablet, Take 10 mg by mouth daily., Disp: , Rfl:  .  Cholecalciferol (VITAMIN D PO), Take by mouth., Disp: , Rfl:  .  glucagon (GLUCAGON EMERGENCY) 1 MG injection, Inject 1 mg into the muscle once as needed for up to 1 dose., Disp: 1 each, Rfl: 12 .  insulin lispro (HUMALOG) 100 UNIT/ML injection, VIA INSULIN PUMP - 150 UNITS PER DAY, Disp: 150 mL, Rfl: 3 .  lamoTRIgine (LAMICTAL) 150 MG tablet, TAKE 2 TABLETS AT BEDTIME, Disp: 180 tablet, Rfl: 3 .  meclizine (ANTIVERT) 25 MG tablet, Take 1 tablet (25 mg total) by mouth 3 (three) times daily as needed for dizziness., Disp: 30 tablet, Rfl: 0 .  Melatonin 10 MG TABS, Take 10 mg by mouth at bedtime as needed., Disp: , Rfl:  .  metaxalone (SKELAXIN) 800 MG tablet, Take 1 tablet (800 mg total) by mouth 3 (three) times daily as needed for muscle spasms., Disp: 30 tablet, Rfl: 3 .  metFORMIN (GLUCOPHAGE-XR) 500 MG 24 hr tablet, TAKE 1 TABLET BY MOUTH TWICE A DAY, Disp: 180 tablet, Rfl: 3 .  modafinil (PROVIGIL) 200 MG tablet, Take 1.5 tablets (300 mg total) by mouth daily., Disp: 45 tablet, Rfl: 5 .  montelukast (SINGULAIR) 10 MG tablet, TAKE 1 TABLET BY MOUTH EVERYDAY AT BEDTIME, Disp: 90 tablet, Rfl: 1 .  ONETOUCH VERIO test strip, USE AS DIRECTED. TESTING FREQUENCY: 4-6X/DAILY. (DX:E10.65), Disp: 500 each, Rfl: 5 .  pantoprazole (PROTONIX) 40 MG tablet, TAKE 1 TABLET DAILY (NEED OFFICE VISIT FOR REFILLS), Disp: 90 tablet, Rfl: 3 .  QUEtiapine (SEROQUEL) 400 MG tablet, Take 1 tablet (400 mg total) by mouth at bedtime., Disp: 90 tablet, Rfl: 1 .  rosuvastatin (CRESTOR) 20 MG tablet, Take 1 tablet (20 mg total) by mouth at bedtime., Disp: 90 tablet, Rfl: 1 .  TURMERIC PO, Take by mouth., Disp: , Rfl:  .  CANNABIDIOL PO, Take by mouth. (Patient not taking: Reported on 08/27/2019), Disp: , Rfl:  .  lisinopril (ZESTRIL) 10 MG tablet, Take 1 tablet (10 mg total) by mouth daily., Disp: 90 tablet, Rfl:  3 Medication Side Effects: none  Family Medical/ Social History: Changes? No  MENTAL HEALTH EXAM:  There were no vitals taken for this visit.There is no height or weight on file to calculate BMI.  General Appearance: Casual, Neat, Well Groomed and Obese  Eye Contact:  Good  Speech:  Clear and Coherent and Normal Rate  Volume:  Normal  Mood:  Euthymic  Affect:  Appropriate  Thought Process:  Goal Directed and Descriptions of Associations: Intact  Orientation:  Full (Time, Place, and Person)  Thought Content: Logical   Suicidal Thoughts:  No  Homicidal Thoughts:  No  Memory:  WNL  Judgement:  Good  Insight:  Good  Psychomotor Activity:  Normal  Concentration:  Concentration: Good  Recall:  Good  Fund of Knowledge: Good  Language: Good  Assets:  Desire for Improvement  ADL's:  Intact  Cognition: WNL  Prognosis:  Good   04/26/2019 labs were reviewed, done through her endocrinologist.  DIAGNOSES:    ICD-10-CM   1. GAD (generalized anxiety disorder)  F41.1   2. Attention deficit hyperactivity disorder (ADHD), predominantly inattentive type  F90.0   3. Bipolar I disorder ([place])  F31.9     Receiving Psychotherapy: No    RECOMMENDATIONS:  PDMP was reviewed. I provided 20 minutes face-to-face time during this encounter. Understand about the social phobia since she has been working from home so long.  Giving a prescription for Xanax is not a problem.  She does have a remote history of alcohol abuse and does not like taking anything that may be addictive.prescribe a small amount of Xanax on occasion, as the [facility] registry shows, I have no concerns of abuse for her. Continue Xanax 0.5 mg 1/2-1 twice daily as needed.  She only takes this in very rare cases. Continue Lamictal 150 mg, 1 p.o. twice daily. Continue modafinil 200 mg, 1.5 pills daily. Continue Seroquel 400 mg nightly. Continue melatonin 10 mg nightly as needed sleep. Return in 6 months.   Donnal Moat,  PA-C

## 2019-08-29 ENCOUNTER — Encounter: Payer: Self-pay | Admitting: Emergency Medicine

## 2019-08-29 ENCOUNTER — Ambulatory Visit: Payer: 59 | Admitting: Emergency Medicine

## 2019-08-29 ENCOUNTER — Other Ambulatory Visit: Payer: Self-pay

## 2019-08-29 VITALS — BP 134/82 | HR 95 | Temp 98.1°F | Resp 16 | Ht 64.0 in | Wt 245.0 lb

## 2019-08-29 DIAGNOSIS — I10 Essential (primary) hypertension: Secondary | ICD-10-CM | POA: Diagnosis not present

## 2019-08-29 DIAGNOSIS — E1059 Type 1 diabetes mellitus with other circulatory complications: Secondary | ICD-10-CM | POA: Diagnosis not present

## 2019-08-29 DIAGNOSIS — S46912A Strain of unspecified muscle, fascia and tendon at shoulder and upper arm level, left arm, initial encounter: Secondary | ICD-10-CM | POA: Diagnosis not present

## 2019-08-29 DIAGNOSIS — R202 Paresthesia of skin: Secondary | ICD-10-CM
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
## 2019-08-29 NOTE — Patient Instructions (Addendum)
If you have lab work done today you will be contacted with your lab results within the next 2 weeks.  If you have not heard from [place] then please contact us. The fastest way to get your results is to register for My Chart.   IF you received an x-ray today, you will receive an invoice from [facility]. Please contact [facility] at [phone] with questions or concerns regarding your invoice.   IF you received labwork today, you will receive an invoice from [facility]. Please contact [facility] at [phone] with questions or concerns regarding your invoice.   Our billing staff will not be able to assist you with questions regarding bills from these companies.  You will be contacted with the lab results as soon as they are available. The fastest way to get your results is to activate your My Chart account. Instructions are located on the last page of this paperwork. If you have not heard from [place] regarding the results in 2 weeks, please contact this office.     Carpal Tunnel Syndrome  Carpal tunnel syndrome is a condition that causes pain in your hand and arm. The carpal tunnel is a narrow area that is on the palm side of your wrist. Repeated wrist motion or certain diseases may cause swelling in the tunnel. This swelling can pinch the main nerve in the wrist (median nerve). What are the causes? This condition may be caused by:  Repeated wrist motions.  Wrist injuries.  Arthritis.  A sac of fluid (cyst) or abnormal growth (tumor) in the carpal tunnel.  Fluid buildup during pregnancy. Sometimes the cause is not known. What increases the risk? The following factors may make you more likely to develop this condition:  Having a job in which you move your wrist in the same way many times. This includes jobs like being a Software engineer or a Scientist, water quality.  Being a woman.  Having other health conditions, such as: ? Diabetes. ? Obesity. ? A thyroid gland that is not active  enough (hypothyroidism). ? Kidney failure. What are the signs or symptoms? Symptoms of this condition include:  A tingling feeling in your fingers.  Tingling or a loss of feeling (numbness) in your hand.  Pain in your entire arm. This pain may get worse when you bend your wrist and elbow for a long time.  Pain in your wrist that goes up your arm to your shoulder.  Pain that goes down into your palm or fingers.  A weak feeling in your hands. You may find it hard to grab and hold items. You may feel worse at night. How is this diagnosed? This condition is diagnosed with a medical history and physical exam. You may also have tests, such as:  Electromyogram (EMG). This test checks the signals that the nerves send to the muscles.  Nerve conduction study. This test checks how well signals pass through your nerves.  Imaging tests, such as X-rays, ultrasound, and MRI. These tests check for what might be the cause of your condition. How is this treated? This condition may be treated with:  Lifestyle changes. You will be asked to stop or change the activity that caused your problem.  Doing exercise and activities that make bones and muscles stronger (physical therapy).  Learning how to use your hand again (occupational therapy).  Medicines for pain and swelling (inflammation). You may have injections in your wrist.  A wrist splint.  Surgery. Follow these instructions at home: If  you have a splint:  Wear the splint as told by your doctor. Remove it only as told by your doctor.  Loosen the splint if your fingers: ? Tingle. ? Lose feeling (become numb). ? Turn cold and blue.  Keep the splint clean.  If the splint is not waterproof: ? Do not let it get wet. ? Cover it with a watertight covering when you take a bath or a shower. Managing pain, stiffness, and swelling   If told, put ice on the painful area: ? If you have a removable splint, remove it as told by your  doctor. ? Put ice in a plastic bag. ? Place a towel between your skin and the bag. ? Leave the ice on for 20 minutes, 2-3 times per day. General instructions  Take over-the-counter and prescription medicines only as told by your doctor.  Rest your wrist from any activity that may cause pain. If needed, talk with your boss at work about changes that can help your wrist heal.  Do any exercises as told by your doctor, physical therapist, or occupational therapist.  Keep all follow-up visits as told by your doctor. This is important. Contact a doctor if:  You have new symptoms.  Medicine does not help your pain.  Your symptoms get worse. Get help right away if:  You have very bad numbness or tingling in your wrist or hand. Summary  Carpal tunnel syndrome is a condition that causes pain in your hand and arm.  It is often caused by repeated wrist motions.  Lifestyle changes and medicines are used to treat this problem. Surgery may help in very bad cases.  Follow your doctor's instructions about wearing a splint, resting your wrist, keeping follow-up visits, and calling for help. This information is not intended to replace advice given to you by your health care provider. Make sure you discuss any questions you have with your health care provider. Document Revised: 06/03/2017 Document Reviewed: 06/03/2017 Elsevier Patient Education  [place].  Shoulder Pain Many things can cause shoulder pain, including:  An injury.  Moving the shoulder in the same way again and again (overuse).  Joint pain (arthritis). Pain can come from:  Swelling and irritation (inflammation) of any part of the shoulder.  An injury to the shoulder joint.  An injury to: ? Tissues that connect muscle to bone (tendons). ? Tissues that connect bones to each other (ligaments). ? Bones. Follow these instructions at home: Watch for changes in your symptoms. Let your doctor know about them. Follow  these instructions to help with your pain. If you have a sling:  Wear the sling as told by your doctor. Remove it only as told by your doctor.  Loosen the sling if your fingers: ? Tingle. ? Become numb. ? Turn cold and blue.  Keep the sling clean.  If the sling is not waterproof: ? Do not let it get wet. ? Take the sling off when you shower or bathe. Managing pain, stiffness, and swelling   If told, put ice on the painful area: ? Put ice in a plastic bag. ? Place a towel between your skin and the bag. ? Leave the ice on for 20 minutes, 2-3 times a day. Stop putting ice on if it does not help with the pain.  Squeeze a soft ball or a foam pad as much as possible. This prevents swelling in the shoulder. It also helps to strengthen the arm. General instructions  Take over-the-counter and  prescription medicines only as told by your doctor.  Keep all follow-up visits as told by your doctor. This is important. Contact a doctor if:  Your pain gets worse.  Medicine does not help your pain.  You have new pain in your arm, hand, or fingers. Get help right away if:  Your arm, hand, or fingers: ? Tingle. ? Are numb. ? Are swollen. ? Are painful. ? Turn white or blue. Summary  Shoulder pain can be caused by many things. These include injury, moving the shoulder in the same away again and again, and joint pain.  Watch for changes in your symptoms. Let your doctor know about them.  This condition may be treated with a sling, ice, and pain medicine.  Contact your doctor if the pain gets worse or you have new pain. Get help right away if your arm, hand, or fingers tingle or get numb, swollen, or painful.  Keep all follow-up visits as told by your doctor. This is important. This information is not intended to replace advice given to you by your health care provider. Make sure you discuss any questions you have with your health care provider. Document Revised: 08/09/2017 Document  Reviewed: 08/09/2017 Elsevier Patient Education  [place].

## 2019-09-16 ENCOUNTER — Other Ambulatory Visit: Payer: Self-pay | Admitting: Emergency Medicine

## 2019-09-16 DIAGNOSIS — J309 Allergic rhinitis, unspecified: Secondary | ICD-10-CM

## 2019-09-16 DIAGNOSIS — R059 Cough, unspecified: Secondary | ICD-10-CM

## 2019-09-16 NOTE — Telephone Encounter (Signed)
Requested Prescriptions  Pending Prescriptions Disp Refills   montelukast (SINGULAIR) 10 MG tablet [Pharmacy Med Name: MONTELUKAST SOD 10 MG TABLET] 90 tablet 1    Sig: TAKE 1 TABLET BY MOUTH EVERYDAY AT BEDTIME     Pulmonology:  Leukotriene Inhibitors Passed - 09/16/2019  4:30 PM      Passed - Valid encounter within last 12 months    Recent Outpatient Visits          2 weeks ago Strain of left shoulder, initial encounter   [facility] at [place], [place], MD   3 months ago Hypertension associated with type 1 diabetes mellitus [facility])   Primary Care at [facility], [place], MD   9 months ago Hypertension associated with type 1 diabetes mellitus [facility])   Primary Care at [facility], Ines Bloomer, MD   1 year ago Acute UTI   Primary Care at [facility], [place], [place]   1 year ago Type 1 diabetes mellitus without complication [facility])   Primary Care at [facility], Ines Bloomer, MD      Future Appointments            In 2 months Sagardia, Ines Bloomer, MD Primary [facility] at [place], [facility]

## 2019-09-20 ENCOUNTER — Other Ambulatory Visit: Payer: Self-pay | Admitting: Emergency Medicine

## 2019-10-08 ENCOUNTER — Other Ambulatory Visit: Payer: Self-pay | Admitting: Physician Assistant

## 2019-10-11 ENCOUNTER — Other Ambulatory Visit: Payer: Self-pay

## 2019-10-11 ENCOUNTER — Ambulatory Visit: Payer: 59 | Admitting: Sports Medicine

## 2019-10-11 VITALS — BP 110/72 | Ht 64.0 in | Wt 240.0 lb

## 2019-10-11 DIAGNOSIS — G5602 Carpal tunnel syndrome, left upper limb: Secondary | ICD-10-CM | POA: Diagnosis not present

## 2019-10-11 MED ORDER — MELOXICAM 15 MG PO TABS
ORAL_TABLET | ORAL | 0 refills | Status: DC
Start: 2019-10-11 — End: 2019-11-05

## 2019-10-11 NOTE — Patient Instructions (Addendum)
You have Carpal tunnel in the left wrist.  -You will be contacted for a nerve conduction study to evaluate the extent of the damage to the median nerve in your wrist.  -Wear your wrist brace at night, and only during the day as you need to for symptom control  (if you're typing heavily at work).  [facility] written for an antiinflammatory medication called Mobic. Take one tablet daily for ten days (with food). This should help reduce the inflammation around the nerve in your wrist. If you notice a worsening of your heartburn, stop taking this medication and let us know.  -We will see you back after your nerve conduction study to help determine your next steps in care.    Nerve Conduction Study will be done at: [facility] [place] [address] Monday 10/29/19 @ 2:30pm [phone]

## 2019-10-11 NOTE — Progress Notes (Signed)
Office Visit Note   Patient: Karla Rodriguez           Date of Birth: January 29, 1966           MRN: [id number] Visit Date: 10/11/2019 Requested by: Horald Pollen, MD [place],  [place] [address] PCP: Horald Pollen, MD  Subjective: CC: Left Hand Numbness  HPI: 54 year old right-hand-dominant female presenting to clinic with concerns of left hand numbness for the past 2 and half months.  Patient states she was working in her garden pulling out rosebushes, and then noticed left shoulder pain and left hand numbness.  She presented initially to her primary care provider, who gave her a left shoulder injection as well as a left wrist brace.  The left shoulder injection has done very well at resolving her left shoulder pain, and the left wrist brace controls her left hand numbness when she wears it.  Patient states that she has been wearing her brace "all the time," even at night, and that whenever she takes her brace off she will get a prompt return to the uncomfortable numb sensation in her first 3 digits.  She says that she has started dropping things around the house, and feels as though her left hand is starting to get weaker.  She works at the keyboard throughout the day, and says that she will wear her wrist brace to prevent the numbness so that she can function at work.  She denies any neck pain to accompany her symptoms.               ROS:   All other systems were reviewed and are negative.  Objective: Vital Signs: BP 110/72    Ht 5\' 4"  (1.626 m)    Wt 240 lb (108.9 kg)    BMI 41.20 kg/m   Physical Exam:  General:  Alert and oriented, in no acute distress. Pulm:  Breathing unlabored. Psy:  Normal mood, congruent affect. Skin: No bruising or rashes.  Left wrist:  Left arm with no swelling or deformity. Full ROM at left elbow, wrist, and within all fingers with no pain. No thenar eminence wasting appreciated.  Palpation: No tenderness to palpation over first dorsal  compartment of wrist, over scaphoid, or TFCC.  Tinel's negative at carpal tunnel, however Phalen's positive- with tingling sensation in 1-3rd digits.  Finkelstein's negative.  Negative tinel's at cubital tunnel.  Reduced strength with thumb opposition when compared to right.  Brisk capillary refill      Imaging: No results found.  Assessment & Plan: 54 year old female presenting to clinic with left-sided carpal tunnel syndrome, poorly responsive to bracing as her symptoms promptly return when bracing is removed.  -Given her reports of dropping items, will order EMG to determine the severity of the median nerve damage.  -Mobic x10 days for inflammation around nerve -RTC pending nerve conduction study to help determine next steps in care.  -Patient agrees with plan and has no further concerns today.    Patient seen and evaluated with the sports medicine fellow. I agree with the above plan of care. Patient's complaint of subjective weakness as well as weakness appreciated on today's exam is concerning. I would like to get an EMG/nerve conduction study to confirm carpal tunnel syndrome and its severity. In the meantime, she will continue with her cock up wrist brace and we will start her on 10 days of meloxicam. Phone follow-up with results of her EMG/nerve conduction study when available and we  will delineate a more definitive treatment plan based on those findings.

## 2019-10-28 ENCOUNTER — Other Ambulatory Visit: Payer: Self-pay | Admitting: Internal Medicine

## 2019-10-28 DIAGNOSIS — E1010 Type 1 diabetes mellitus with ketoacidosis without coma: Secondary | ICD-10-CM

## 2019-10-30 ENCOUNTER — Telehealth: Payer: Self-pay | Admitting: Sports Medicine

## 2019-10-30 NOTE — Telephone Encounter (Signed)
  Patient notified today of her EMG/nerve conduction study results.  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  Based on these findings I recommend surgical consultation.  Patient is in agreement with this plan.  I will defer further work-up and treatment to their discretion.

## 2019-11-01 ENCOUNTER — Ambulatory Visit: Payer: 59 | Admitting: Sports Medicine

## 2019-11-02 ENCOUNTER — Telehealth: Payer: Self-pay | Admitting: *Deleted

## 2019-11-02 NOTE — Telephone Encounter (Signed)
Schedule routine mammogram on Mobile Unit 9-27

## 2019-11-03 ENCOUNTER — Other Ambulatory Visit: Payer: Self-pay | Admitting: Sports Medicine

## 2019-11-06 ENCOUNTER — Encounter: Payer: Self-pay | Admitting: Sports Medicine

## 2019-11-15 ENCOUNTER — Other Ambulatory Visit: Payer: Self-pay

## 2019-11-15 ENCOUNTER — Ambulatory Visit: Payer: 59 | Admitting: Internal Medicine

## 2019-11-15 ENCOUNTER — Encounter: Payer: Self-pay | Admitting: Internal Medicine

## 2019-11-15 VITALS — BP 132/76 | HR 85 | Ht 65.0 in | Wt 242.2 lb

## 2019-11-15 DIAGNOSIS — R7989 Other specified abnormal findings of blood chemistry: Secondary | ICD-10-CM | POA: Diagnosis not present

## 2019-11-15 DIAGNOSIS — E7800 Pure hypercholesterolemia, unspecified: Secondary | ICD-10-CM

## 2019-11-15 DIAGNOSIS — Z23 Encounter for immunization: Secondary | ICD-10-CM | POA: Diagnosis not present

## 2019-11-15 DIAGNOSIS — E1010 Type 1 diabetes mellitus with ketoacidosis without coma: Secondary | ICD-10-CM | POA: Diagnosis not present

## 2019-11-15 LAB — POCT GLYCOSYLATED HEMOGLOBIN (HGB A1C): Hemoglobin A1C: 6.8 % — AB (ref 4.0–5.6)

## 2019-11-15 LAB — TSH: TSH: 2.32 u[IU]/mL (ref 0.35–4.50)

## 2019-11-15 LAB — LIPID PANEL
Cholesterol: 139 mg/dL (ref 0–200)
HDL: 50 mg/dL (ref >39.00)
LDL Cholesterol: 67 mg/dL (ref 0–99)
NonHDL: 89.12
Total CHOL/HDL Ratio: 3
Triglycerides: 110 mg/dL (ref 0.0–149.0)
VLDL: 22 mg/dL (ref 0.0–40.0)
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
## 2019-11-15 NOTE — Patient Instructions (Addendum)
Please continue: - basal rates: 12 am: 3.1 >> 3.3 11 am: 2.95 1 pm: 1.60 6 pm: 1.55  - ICR:   12 am: 3.7  7 am: 3.5  11 am: 4.3  3:30 pm: 4.0  4:30 pm: 4.3  5 pm: 3.4 - target: 105-115 - ISF:  12 am: 20 11 am: 30 3 pm: 20 - Insulin on Board: 2:45 h - bolus wizard: on  Continue to bolus for coffee.  Please use a target of 150 when working outside or exercising.  Please do the following approximately 15 minutes before every meal: - Enter carbs (C) - Enter sugars (S) - Start insulin bolus (I)  Also continue: - Metformin ER 500 mg 2x a day (try to take 1000 mg with dinner)  Please return in 4 months.

## 2019-11-16 LAB — COMPLETE METABOLIC PANEL WITH GFR
AG Ratio: 2.3 (calc) (ref 1.0–2.5)
ALT: 15 U/L (ref 6–29)
AST: 17 U/L (ref 10–35)
Albumin: 4.8 g/dL (ref 3.6–5.1)
Alkaline phosphatase (APISO): 84 U/L (ref 37–153)
BUN: 14 mg/dL (ref 7–25)
CO2: 22 mmol/L (ref 20–32)
Calcium: 9.9 mg/dL (ref 8.6–10.4)
Chloride: 102 mmol/L (ref 98–110)
Creat: 0.88 mg/dL (ref 0.50–1.05)
GFR, Est African American: 88 mL/min/{1.73_m2} (ref >=60)
GFR, Est Non African American: 76 mL/min/{1.73_m2} (ref >=60)
Globulin: 2.1 g/dL (calc) (ref 1.9–3.7)
Glucose, Bld: 107 mg/dL — ABNORMAL HIGH (ref 65–99)
Potassium: 4.8 mmol/L (ref 3.5–5.3)
Sodium: 139 mmol/L (ref 135–146)
Total Bilirubin: 0.5 mg/dL (ref 0.2–1.2)
Total Protein: 6.9 g/dL (ref 6.1–8.1)

## 2019-11-20 ENCOUNTER — Other Ambulatory Visit: Payer: Self-pay | Admitting: Emergency Medicine

## 2019-11-20 DIAGNOSIS — E1059 Type 1 diabetes mellitus with other circulatory complications: Secondary | ICD-10-CM

## 2019-11-20 DIAGNOSIS — I152 Hypertension secondary to endocrine disorders: Secondary | ICD-10-CM

## 2019-11-20 NOTE — Telephone Encounter (Signed)
Requested Prescriptions  Pending Prescriptions Disp Refills   lisinopril (ZESTRIL) 10 MG tablet [Pharmacy Med Name: LISINOPRIL 10 MG TABLET] 90 tablet 0    Sig: TAKE 1 TABLET BY MOUTH EVERY DAY     Cardiovascular:  ACE Inhibitors Passed - 11/20/2019  9:10 AM      Passed - Cr in normal range and within 180 days    Creat  Date Value Ref Range Status  11/15/2019 0.88 0.50 - 1.05 mg/dL Final    Comment:    For patients >52 years of age, the reference limit for Creatinine is approximately 13% higher for people identified as African-American. .    Creatinine,U  Date Value Ref Range Status  11/17/2018 28.1 mg/dL Final         Passed - K in normal range and within 180 days    Potassium  Date Value Ref Range Status  11/15/2019 4.8 3.5 - 5.3 mmol/L Final         Passed - Patient is not pregnant      Passed - Last BP in normal range    BP Readings from Last 1 Encounters:  11/15/19 132/76         Passed - Valid encounter within last 6 months    Recent Outpatient Visits          2 months ago Strain of left shoulder, initial encounter   [facility] at [place], [place], MD   5 months ago Hypertension associated with type 1 diabetes mellitus [facility])   Primary Care at [facility], [place], MD   11 months ago Hypertension associated with type 1 diabetes mellitus [facility])   Primary Care at [facility], Ines Bloomer, MD   1 year ago Acute UTI   Primary Care at [facility], [place], MD   1 year ago Type 1 diabetes mellitus without complication [facility])   Primary Care at [facility], Ines Bloomer, MD      Future Appointments            In 1 week [place], Ines Bloomer, MD Primary Care at [place], [facility]

## 2019-11-29 ENCOUNTER — Ambulatory Visit: Payer: 59 | Admitting: Emergency Medicine

## 2019-12-17 ENCOUNTER — Other Ambulatory Visit: Payer: Self-pay | Admitting: Emergency Medicine

## 2019-12-17 NOTE — Telephone Encounter (Signed)
Requested Prescriptions  Pending Prescriptions Disp Refills   rosuvastatin (CRESTOR) 20 MG tablet [Pharmacy Med Name: ROSUVASTATIN CALCIUM 20 MG TAB] 90 tablet 0    Sig: TAKE 1 TABLET BY MOUTH EVERYDAY AT BEDTIME     Cardiovascular:  Antilipid - Statins Passed - 12/17/2019  1:35 AM      Passed - Total Cholesterol in normal range and within 360 days    Cholesterol  Date Value Ref Range Status  11/15/2019 139 0 - 200 mg/dL Final    Comment:    ATP III Classification       Desirable:  < 200 mg/dL               Borderline High:  200 - 239 mg/dL          High:  > = 240 mg/dL         Passed - LDL in normal range and within 360 days    LDL Cholesterol  Date Value Ref Range Status  11/15/2019 67 0 - 99 mg/dL Final         Passed - HDL in normal range and within 360 days    HDL  Date Value Ref Range Status  11/15/2019 50.00 >39.00 mg/dL Final         Passed - Triglycerides in normal range and within 360 days    Triglycerides  Date Value Ref Range Status  11/15/2019 110.0 0 - 149 mg/dL Final    Comment:    Normal:  <150 mg/dLBorderline High:  150 - 199 mg/dL         Passed - Patient is not pregnant      Passed - Valid encounter within last 12 months    Recent Outpatient Visits          3 months ago Strain of left shoulder, initial encounter   Primary [facility] at [place], [place], MD   6 months ago Hypertension associated with type 1 diabetes mellitus [facility])   Primary Care at [place], Ines Bloomer, MD   1 year ago Hypertension associated with type 1 diabetes mellitus [facility])   Primary Care at [facility], Ines Bloomer, MD   1 year ago Acute UTI   Primary Care at [facility], [place], MD   1 year ago Type 1 diabetes mellitus without complication [facility])   Primary Care at [facility], Ines Bloomer, MD      Future Appointments            In 1 week [place], Ines Bloomer, MD Primary Care at [place], [facility]

## 2019-12-25 ENCOUNTER — Ambulatory Visit: Payer: 59 | Admitting: Emergency Medicine

## 2019-12-31 ENCOUNTER — Other Ambulatory Visit: Payer: Self-pay | Admitting: Emergency Medicine

## 2020-01-01 ENCOUNTER — Ambulatory Visit: Payer: 59 | Admitting: Emergency Medicine

## 2020-01-01 ENCOUNTER — Other Ambulatory Visit: Payer: Self-pay

## 2020-01-01 ENCOUNTER — Encounter: Payer: Self-pay | Admitting: Emergency Medicine

## 2020-01-01 VITALS — BP 133/85 | HR 85 | Temp 97.3°F | Ht 65.0 in | Wt 228.0 lb

## 2020-01-01 DIAGNOSIS — I152 Hypertension secondary to endocrine disorders: Secondary | ICD-10-CM | POA: Diagnosis not present

## 2020-01-01 DIAGNOSIS — G4733 Obstructive sleep apnea (adult) (pediatric): Secondary | ICD-10-CM | POA: Diagnosis not present

## 2020-01-01 DIAGNOSIS — F3178 Bipolar disorder, in full remission, most recent episode mixed: Secondary | ICD-10-CM

## 2020-01-01 DIAGNOSIS — E1059 Type 1 diabetes mellitus with other circulatory complications: Secondary | ICD-10-CM

## 2020-01-01 NOTE — Patient Instructions (Addendum)
   If you have lab work done today you will be contacted with your lab results within the next 2 weeks.  If you have not heard from us then please contact us. The fastest way to get your results is to register for My Chart.   IF you received an x-ray today, you will receive an invoice from [facility]. Please contact [facility] at [phone] with questions or concerns regarding your invoice.   IF you received labwork today, you will receive an invoice from [facility]. Please contact [facility] at [phone] with questions or concerns regarding your invoice.   Our billing staff will not be able to assist you with questions regarding bills from these companies.  You will be contacted with the lab results as soon as they are available. The fastest way to get your results is to activate your My Chart account. Instructions are located on the last page of this paperwork. If you have not heard from us regarding the results in 2 weeks, please contact this office.       Health Maintenance, Female Adopting a healthy lifestyle and getting preventive care are important in promoting health and wellness. Ask your health care provider about:  The right schedule for you to have regular tests and exams.  Things you can do on your own to prevent diseases and keep yourself healthy. What should I know about diet, weight, and exercise? Eat a healthy diet   Eat a diet that includes plenty of vegetables, fruits, low-fat dairy products, and lean protein.  Do not eat a lot of foods that are high in solid fats, added sugars, or sodium. Maintain a healthy weight Body mass index (BMI) is used to identify weight problems. It estimates body fat based on height and weight. Your health care provider can help determine your BMI and help you achieve or maintain a healthy weight. Get regular exercise Get regular exercise. This is one of the most important things you can do for your health. Most  adults should:  Exercise for at least 150 minutes each week. The exercise should increase your heart rate and make you sweat (moderate-intensity exercise).  Do strengthening exercises at least twice a week. This is in addition to the moderate-intensity exercise.  Spend less time sitting. Even light physical activity can be beneficial. Watch cholesterol and blood lipids Have your blood tested for lipids and cholesterol at 52 years of age, then have this test every 5 years. Have your cholesterol levels checked more often if:  Your lipid or cholesterol levels are high.  You are older than 52 years of age.  You are at high risk for heart disease. What should I know about cancer screening? Depending on your health history and family history, you may need to have cancer screening at various ages. This may include screening for:  Breast cancer.  Cervical cancer.  Colorectal cancer.  Skin cancer.  Lung cancer. What should I know about heart disease, diabetes, and high blood pressure? Blood pressure and heart disease  High blood pressure causes heart disease and increases the risk of stroke. This is more likely to develop in people who have high blood pressure readings, are of African descent, or are overweight.  Have your blood pressure checked: ? Every 3-5 years if you are 18-39 years of age. ? Every year if you are 40 years old or older. Diabetes Have regular diabetes screenings. This checks your fasting blood sugar level. Have the screening done:  Once   every three years after age 40 if you are at a normal weight and have a low risk for diabetes.  More often and at a younger age if you are overweight or have a high risk for diabetes. What should I know about preventing infection? Hepatitis B If you have a higher risk for hepatitis B, you should be screened for this virus. Talk with your health care provider to find out if you are at risk for hepatitis B infection. Hepatitis  C Testing is recommended for:  Everyone born from 1945 through 1965.  Anyone with known risk factors for hepatitis C. Sexually transmitted infections (STIs)  Get screened for STIs, including gonorrhea and chlamydia, if: ? You are sexually active and are younger than 52 years of age. ? You are older than 52 years of age and your health care provider tells you that you are at risk for this type of infection. ? Your sexual activity has changed since you were last screened, and you are at increased risk for chlamydia or gonorrhea. Ask your health care provider if you are at risk.  Ask your health care provider about whether you are at high risk for HIV. Your health care provider may recommend a prescription medicine to help prevent HIV infection. If you choose to take medicine to prevent HIV, you should first get tested for HIV. You should then be tested every 3 months for as long as you are taking the medicine. Pregnancy  If you are about to stop having your period (premenopausal) and you may become pregnant, seek counseling before you get pregnant.  Take 400 to 800 micrograms (mcg) of folic acid every day if you become pregnant.  Ask for birth control (contraception) if you want to prevent pregnancy. Osteoporosis and menopause Osteoporosis is a disease in which the bones lose minerals and strength with aging. This can result in bone fractures. If you are 65 years old or older, or if you are at risk for osteoporosis and fractures, ask your health care provider if you should:  Be screened for bone loss.  Take a calcium or vitamin D supplement to lower your risk of fractures.  Be given hormone replacement therapy (HRT) to treat symptoms of menopause. Follow these instructions at home: Lifestyle  Do not use any products that contain nicotine or tobacco, such as cigarettes, e-cigarettes, and chewing tobacco. If you need help quitting, ask your health care provider.  Do not use street  drugs.  Do not share needles.  Ask your health care provider for help if you need support or information about quitting drugs. Alcohol use  Do not drink alcohol if: ? Your health care provider tells you not to drink. ? You are pregnant, may be pregnant, or are planning to become pregnant.  If you drink alcohol: ? Limit how much you use to 0-1 drink a day. ? Limit intake if you are breastfeeding.  Be aware of how much alcohol is in your drink. In the [place] one drink equals one 12 oz bottle of beer (355 mL), one 5 oz glass of wine (148 mL), or one 1 oz glass of hard liquor (44 mL). General instructions  Schedule regular health, dental, and eye exams.  Stay current with your vaccines.  Tell your health care provider if: ? You often feel depressed. ? You have ever been abused or do not feel safe at home. Summary  Adopting a healthy lifestyle and getting preventive care are important in promoting health and   wellness.  Follow your health care provider's instructions about healthy diet, exercising, and getting tested or screened for diseases.  Follow your health care provider's instructions on monitoring your cholesterol and blood pressure. This information is not intended to replace advice given to you by your health care provider. Make sure you discuss any questions you have with your health care provider. Document Revised: 01/18/2018 Document Reviewed: 01/18/2018 [employer] Patient Education  2020 [employer]  
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
## 2020-01-31 ENCOUNTER — Other Ambulatory Visit: Payer: Self-pay

## 2020-01-31 NOTE — Telephone Encounter (Signed)
Inbound fax from [employer] requesting a refill for pump supplies. Form completed and faxed to [phone]

## 2020-02-20 ENCOUNTER — Telehealth: Payer: Self-pay

## 2020-02-24 ENCOUNTER — Other Ambulatory Visit: Payer: Self-pay | Admitting: Emergency Medicine

## 2020-02-24 DIAGNOSIS — E1059 Type 1 diabetes mellitus with other circulatory complications: Secondary | ICD-10-CM

## 2020-02-24 DIAGNOSIS — I152 Hypertension secondary to endocrine disorders: Secondary | ICD-10-CM

## 2020-02-25 ENCOUNTER — Encounter: Payer: Self-pay | Admitting: Physician Assistant

## 2020-02-25 ENCOUNTER — Telehealth (INDEPENDENT_AMBULATORY_CARE_PROVIDER_SITE_OTHER): Payer: 59 | Admitting: Physician Assistant

## 2020-02-25 ENCOUNTER — Telehealth: Payer: Self-pay | Admitting: Physician Assistant

## 2020-02-25 DIAGNOSIS — F900 Attention-deficit hyperactivity disorder, predominantly inattentive type: Secondary | ICD-10-CM

## 2020-02-25 DIAGNOSIS — F411 Generalized anxiety disorder: Secondary | ICD-10-CM

## 2020-02-25 DIAGNOSIS — F1021 Alcohol dependence, in remission: Secondary | ICD-10-CM

## 2020-02-25 DIAGNOSIS — F319 Bipolar disorder, unspecified: Secondary | ICD-10-CM | POA: Diagnosis not present

## 2020-02-25 MED ORDER — MODAFINIL 200 MG PO TABS: 300.0000 mg | ORAL_TABLET | Freq: Every day | ORAL | 5 refills | Status: DC

## 2020-02-25 MED ORDER — QUETIAPINE FUMARATE 400 MG PO TABS: 400.0000 mg | ORAL_TABLET | Freq: Every day | ORAL | 1 refills | Status: DC

## 2020-02-25 NOTE — Telephone Encounter (Signed)
[name], audia are scheduled for a virtual visit with your provider today.    Just as we do with appointments in the office, we must obtain your consent to participate.  Your consent will be active for this visit and any virtual visit you may have with one of our providers in the next 365 days.    If you have a MyChart account, I can also send a copy of this consent to you electronically.  All virtual visits are billed to your insurance company just like a traditional visit in the office.  As this is a virtual visit, video technology does not allow for your provider to perform a traditional examination.  This may limit your provider's ability to fully assess your condition.  If your provider identifies any concerns that need to be evaluated in person or the need to arrange testing such as labs, EKG, etc, we will make arrangements to do so.    Although advances in technology are sophisticated, we cannot ensure that it will always work on either your end or our end.  If the connection with a video visit is poor, we may have to switch to a telephone visit.  With either a video or telephone visit, we are not always able to ensure that we have a secure connection.   I need to obtain your verbal consent now.   Are you willing to proceed with your visit today?   Karla Rodriguez has provided verbal consent on 02/25/2020 for a virtual visit (video or telephone).   Donnal Moat, PA-C 02/25/2020  2:20 PM

## 2020-02-25 NOTE — Progress Notes (Signed)
[facility]  Patient ID: Karla Rodriguez,  MRN: [id number]  PCP: Horald Pollen, MD  Date of Evaluation: 02/25/2020 Time spent:30 minutes  Chief Complaint:  Chief Complaint    Anorexia; Depression; Follow-up     Virtual Visit via Telehealth  I connected with patient by a video enabled telemedicine application with their informed consent, and verified patient privacy and that I am speaking with the correct person using two identifiers.  I am private, in my home and the patient is at home.  I discussed the limitations, risks, security and privacy concerns of performing an evaluation and management service by video and the availability of in person appointments. I also discussed with the patient that there may be a patient responsible charge related to this service. The patient expressed understanding and agreed to proceed.   I discussed the assessment and treatment plan with the patient. The patient was provided an opportunity to ask questions and all were answered. The patient agreed with the plan and demonstrated an understanding of the instructions.   The patient was advised to call back or seek an in-person evaluation if the symptoms worsen or if the condition fails to improve as anticipated.  I provided 30  minutes of non-face-to-face time during this encounter.   HISTORY/CURRENT STATUS: For 4-month med check.  Patient states she is doing really well.  Her mood is good.  She sleeps well.  Work is going fine.  She is busy but that is good "it is [place]." She works from home 2 days a week now.  Patient denies loss of interest in usual activities and is able to enjoy things.  Denies decreased energy or motivation.  Appetite has not changed.  No extreme sadness, tearfulness, or feelings of hopelessness.  Denies any changes in concentration, making decisions or remembering things.  Does not have anxiety hardly at all.  Her wife is in control of the Xanax and  gives it to her if needed.  Patient is still sober after many years.  Denies suicidal or homicidal thoughts.  Patient denies increased energy with decreased need for sleep, no increased talkativeness, no racing thoughts, no impulsivity or risky behaviors, no increased spending, no increased libido, no grandiosity, no paranoia, and no hallucinations.  Denies dizziness, syncope, seizures, numbness, tingling, tremor, tics, unsteady gait, slurred speech, confusion. Denies muscle or joint pain, stiffness, or dystonia.  Individual Medical History/ Review of Systems: Changes? :Yes  She recently had carpal tunnel surgery.  Has healed well.  Past medications for mental health diagnoses include: Xanax, Lexapro, Rozerem, Saphris, Lamictal, Wellbutrin, Ambien, Nuvigil, Risperdal, Celexa, Seroquel, Latuda, Strattera, lithium  Allergies: Patient has no known allergies.  Current Medications:  Current Outpatient Medications:  .  ALPRAZolam (XANAX) 0.5 MG tablet, Take 0.5-1 tablets (0.25-0.5 mg total) by mouth 2 (two) times daily as needed for anxiety., Disp: 20 tablet, Rfl: 0 .  b complex vitamins capsule, Take 1 capsule by mouth daily., Disp: , Rfl:  .  CANNABIDIOL PO, Take by mouth. , Disp: , Rfl:  .  cetirizine (ZYRTEC) 10 MG tablet, Take 10 mg by mouth daily., Disp: , Rfl:  .  Cholecalciferol (VITAMIN D PO), Take by mouth., Disp: , Rfl:  .  glucagon (GLUCAGON EMERGENCY) 1 MG injection, Inject 1 mg into the muscle once as needed for up to 1 dose., Disp: 1 each, Rfl: 12 .  insulin lispro (HUMALOG) 100 UNIT/ML injection, VIA INSULIN PUMP - 150 UNITS PER DAY, Disp: 150 mL,  Rfl: 3 .  lamoTRIgine (LAMICTAL) 150 MG tablet, TAKE 2 TABLETS AT BEDTIME, Disp: 180 tablet, Rfl: 3 .  lisinopril (ZESTRIL) 10 MG tablet, TAKE 1 TABLET BY MOUTH EVERY DAY, Disp: 90 tablet, Rfl: 0 .  meclizine (ANTIVERT) 25 MG tablet, Take 1 tablet (25 mg total) by mouth 3 (three) times daily as needed for dizziness., Disp: 30 tablet, Rfl:  0 .  Melatonin 10 MG TABS, Take 10 mg by mouth at bedtime as needed., Disp: , Rfl:  .  meloxicam (MOBIC) 15 MG tablet, TAKE 1 TABLET DAILY WITH FOOD FOR 10 DAYS. THEN TAKE AS NEEDED., Disp: 30 tablet, Rfl: 0 .  metaxalone (SKELAXIN) 800 MG tablet, Take 1 tablet (800 mg total) by mouth 3 (three) times daily as needed for muscle spasms., Disp: 30 tablet, Rfl: 3 .  metFORMIN (GLUCOPHAGE-XR) 500 MG 24 hr tablet, TAKE 1 TABLET BY MOUTH TWICE A DAY, Disp: 180 tablet, Rfl: 3 .  montelukast (SINGULAIR) 10 MG tablet, TAKE 1 TABLET BY MOUTH EVERYDAY AT BEDTIME, Disp: 90 tablet, Rfl: 1 .  ONETOUCH VERIO test strip, USE AS DIRECTED. TESTING FREQUENCY: 4-6X/DAILY. (DX:E10.65), Disp: 500 each, Rfl: 5 .  pantoprazole (PROTONIX) 40 MG tablet, TAKE 1 TABLET DAILY (NEED OFFICE VISIT FOR REFILLS), Disp: 90 tablet, Rfl: 0 .  rosuvastatin (CRESTOR) 20 MG tablet, TAKE 1 TABLET BY MOUTH EVERYDAY AT BEDTIME, Disp: 90 tablet, Rfl: 0 .  TURMERIC PO, Take by mouth., Disp: , Rfl:  .  modafinil (PROVIGIL) 200 MG tablet, Take 1.5 tablets (300 mg total) by mouth daily., Disp: 45 tablet, Rfl: 5 .  QUEtiapine (SEROQUEL) 400 MG tablet, Take 1 tablet (400 mg total) by mouth at bedtime., Disp: 90 tablet, Rfl: 1 Medication Side Effects: none  Family Medical/ Social History: Changes? No  MENTAL HEALTH EXAM:  There were no vitals taken for this visit.There is no height or weight on file to calculate BMI.  General Appearance: Casual, Neat, Well Groomed and Obese  Eye Contact:  Good  Speech:  Clear and Coherent and Normal Rate  Volume:  Normal  Mood:  Euthymic  Affect:  Appropriate  Thought Process:  Goal Directed and Descriptions of Associations: Intact  Orientation:  Full (Time, Place, and Person)  Thought Content: Logical   Suicidal Thoughts:  No  Homicidal Thoughts:  No  Memory:  WNL  Judgement:  Good  Insight:  Good  Psychomotor Activity:  Normal  Concentration:  Concentration: Good and Attention Span: Good  Recall:   Good  Fund of Knowledge: Good  Language: Good  Assets:  Desire for Improvement  ADL's:  Intact  Cognition: WNL  Prognosis:  Good   Most recent pertinent labs: 11/15/2019 Glucose 107, BUN and creatinine were normal as were the remainder of CMP results. Hemoglobin A1c 6.8 Lipid profile total cholesterol 139, triglycerides 110, HDL 50, LDL 67  DIAGNOSES:    ICD-10-CM   1. Bipolar I disorder (HCC)  F31.9   2. GAD (generalized anxiety disorder)  F41.1   3. Attention deficit hyperactivity disorder (ADHD), predominantly inattentive type  F90.0   4. Alcoholism in recovery [facility](HCC)  F10.21     Receiving Psychotherapy: No    RECOMMENDATIONS:  PDMP was reviewed. I provided 30 minutes non face-to-face time during this encounter, in which we discussed her response to current medications and reviewing her labs drawn per endocrinologist since her last visit. I am glad to see her doing so well!  No medication changes need to be made. Continue Xanax 0.5  mg 1/2-1 twice daily as needed.  She only takes this in very rare cases. Continue Lamictal 150 mg, 1 p.o. twice daily. Continue modafinil 200 mg, 1.5 pills daily. Continue Seroquel 400 mg nightly. Continue melatonin 10 mg nightly as needed sleep. Return in 6 months,.  Due to the office being closed today for inclement weather, Lakishia will call back within the next few days to set up that appointment.   Donnal Moat, PA-C

## 2020-02-26 ENCOUNTER — Telehealth: Payer: Self-pay

## 2020-02-26 NOTE — Telephone Encounter (Signed)
Prior Authorization renewal submitted and approved for MODAFINIL 200 MG 1.5 TABS, effective 01/27/2020-02/25/2021, PA# [id number]

## 2020-02-29 LAB — HM MAMMOGRAPHY

## 2020-03-05 ENCOUNTER — Encounter: Payer: Self-pay | Admitting: *Deleted

## 2020-03-13 ENCOUNTER — Other Ambulatory Visit: Payer: Self-pay | Admitting: Emergency Medicine

## 2020-03-14 ENCOUNTER — Other Ambulatory Visit: Payer: Self-pay | Admitting: Emergency Medicine

## 2020-03-14 DIAGNOSIS — J309 Allergic rhinitis, unspecified: Secondary | ICD-10-CM

## 2020-03-14 DIAGNOSIS — R059 Cough, unspecified: Secondary | ICD-10-CM

## 2020-03-18 ENCOUNTER — Other Ambulatory Visit: Payer: Self-pay

## 2020-03-18 ENCOUNTER — Encounter: Payer: Self-pay | Admitting: Internal Medicine

## 2020-03-18 ENCOUNTER — Ambulatory Visit: Payer: 59 | Admitting: Internal Medicine

## 2020-03-18 VITALS — BP 128/80 | HR 95 | Ht 65.0 in | Wt 210.6 lb

## 2020-03-18 DIAGNOSIS — R7989 Other specified abnormal findings of blood chemistry: Secondary | ICD-10-CM

## 2020-03-18 DIAGNOSIS — E7800 Pure hypercholesterolemia, unspecified: Secondary | ICD-10-CM

## 2020-03-18 DIAGNOSIS — E1010 Type 1 diabetes mellitus with ketoacidosis without coma: Secondary | ICD-10-CM

## 2020-03-18 LAB — POCT GLYCOSYLATED HEMOGLOBIN (HGB A1C): Hemoglobin A1C: 6.3 % — AB (ref 4.0–5.6)

## 2020-03-18 MED ORDER — GLUCAGON 3 MG/DOSE NA POWD: 3.0000 mg | Freq: Once | NASAL | 11 refills | Status: DC | PRN

## 2020-03-18 NOTE — Patient Instructions (Signed)
Please change: - basal rates: 12 am: 1.9 11 am: 2.4 1 pm: 1.3 >> 1.1 6 pm: 1.15 >> 1.3 - ICR:   12 am: 3.7  7 am: 3.5  11 am: 4.3  3:30 pm: 4.0  4:30 pm: 4.3  5 pm: 3.4 - target: 105-115 - ISF:  12 am: 20 11 am: 30 3 pm: 20 - Insulin on Board: 2:45 h - bolus wizard: on  Restart the CGM.  Please return in 4 month.
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
## 2020-03-28 ENCOUNTER — Other Ambulatory Visit: Payer: Self-pay | Admitting: Emergency Medicine

## 2020-05-19 ENCOUNTER — Ambulatory Visit: Payer: Self-pay | Admitting: Emergency Medicine

## 2020-05-20 ENCOUNTER — Ambulatory Visit: Payer: 59 | Admitting: Emergency Medicine

## 2020-05-20 ENCOUNTER — Other Ambulatory Visit: Payer: Self-pay

## 2020-05-20 ENCOUNTER — Encounter: Payer: Self-pay | Admitting: Emergency Medicine

## 2020-05-20 VITALS — BP 122/80 | HR 80 | Temp 98.5°F | Ht 64.0 in | Wt 208.4 lb

## 2020-05-20 DIAGNOSIS — Z1211 Encounter for screening for malignant neoplasm of colon: Secondary | ICD-10-CM | POA: Diagnosis not present

## 2020-05-20 DIAGNOSIS — E66812 Obesity, class 2: Secondary | ICD-10-CM

## 2020-05-20 DIAGNOSIS — E1159 Type 2 diabetes mellitus with other circulatory complications: Secondary | ICD-10-CM

## 2020-05-20 DIAGNOSIS — I152 Hypertension secondary to endocrine disorders: Secondary | ICD-10-CM

## 2020-05-20 DIAGNOSIS — Z6836 Body mass index (BMI) 36.0-36.9, adult: Secondary | ICD-10-CM

## 2020-05-20 DIAGNOSIS — M549 Dorsalgia, unspecified: Secondary | ICD-10-CM | POA: Diagnosis not present

## 2020-05-20 DIAGNOSIS — F3178 Bipolar disorder, in full remission, most recent episode mixed: Secondary | ICD-10-CM

## 2020-05-20 DIAGNOSIS — F1021 Alcohol dependence, in remission: Secondary | ICD-10-CM | POA: Diagnosis not present

## 2020-05-20 MED ORDER — METAXALONE 800 MG PO TABS: 800.0000 mg | ORAL_TABLET | Freq: Three times a day (TID) | ORAL | 3 refills | Status: DC | PRN
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
## 2020-05-20 NOTE — Patient Instructions (Signed)
Diabetes Mellitus and Nutrition, Adult When you have diabetes, or diabetes mellitus, it is very important to have healthy eating habits because your blood sugar (glucose) levels are greatly affected by what you eat and drink. Eating healthy foods in the right amounts, at about the same times every day, can help you:  Control your blood glucose.  Lower your risk of heart disease.  Improve your blood pressure.  Reach or maintain a healthy weight. What can affect my meal plan? Every person with diabetes is different, and each person has different needs for a meal plan. Your health care provider may recommend that you work with a dietitian to make a meal plan that is best for you. Your meal plan may vary depending on factors such as:  The calories you need.  The medicines you take.  Your weight.  Your blood glucose, blood pressure, and cholesterol levels.  Your activity level.  Other health conditions you have, such as heart or kidney disease. How do carbohydrates affect me? Carbohydrates, also called carbs, affect your blood glucose level more than any other type of food. Eating carbs naturally raises the amount of glucose in your blood. Carb counting is a method for keeping track of how many carbs you eat. Counting carbs is important to keep your blood glucose at a healthy level, especially if you use insulin or take certain oral diabetes medicines. It is important to know how many carbs you can safely have in each meal. This is different for every person. Your dietitian can help you calculate how many carbs you should have at each meal and for each snack. How does alcohol affect me? Alcohol can cause a sudden decrease in blood glucose (hypoglycemia), especially if you use insulin or take certain oral diabetes medicines. Hypoglycemia can be a life-threatening condition. Symptoms of hypoglycemia, such as sleepiness, dizziness, and confusion, are similar to symptoms of having too much  alcohol.  Do not drink alcohol if: ? Your health care provider tells you not to drink. ? You are pregnant, may be pregnant, or are planning to become pregnant.  If you drink alcohol: ? Do not drink on an empty stomach. ? Limit how much you use to:  0-1 drink a day for women.  0-2 drinks a day for men. ? Be aware of how much alcohol is in your drink. In the [place] one drink equals one 12 oz bottle of beer (355 mL), one 5 oz glass of wine (148 mL), or one 1 oz glass of hard liquor (44 mL). ? Keep yourself hydrated with water, diet soda, or unsweetened iced tea.  Keep in mind that regular soda, juice, and other mixers may contain a lot of sugar and must be counted as carbs. What are tips for following this plan? Reading food labels  Start by checking the serving size on the "Nutrition Facts" label of packaged foods and drinks. The amount of calories, carbs, fats, and other nutrients listed on the label is based on one serving of the item. Many items contain more than one serving per package.  Check the total grams (g) of carbs in one serving. You can calculate the number of servings of carbs in one serving by dividing the total carbs by 15. For example, if a food has 30 g of total carbs per serving, it would be equal to 2 servings of carbs.  Check the number of grams (g) of saturated fats and trans fats in one serving. Choose foods that have   a low amount or none of these fats.  Check the number of milligrams (mg) of salt (sodium) in one serving. Most people should limit total sodium intake to less than 2,300 mg per day.  Always check the nutrition information of foods labeled as "low-fat" or "nonfat." These foods may be higher in added sugar or refined carbs and should be avoided.  Talk to your dietitian to identify your daily goals for nutrients listed on the label. Shopping  Avoid buying canned, pre-made, or processed foods. These foods tend to be high in fat, sodium, and added  sugar.  Shop around the outside edge of the grocery store. This is where you will most often find fresh fruits and vegetables, bulk grains, fresh meats, and fresh dairy. Cooking  Use low-heat cooking methods, such as baking, instead of high-heat cooking methods like deep frying.  Cook using healthy oils, such as olive, canola, or sunflower oil.  Avoid cooking with butter, cream, or high-fat meats. Meal planning  Eat meals and snacks regularly, preferably at the same times every day. Avoid going long periods of time without eating.  Eat foods that are high in fiber, such as fresh fruits, vegetables, beans, and whole grains. Talk with your dietitian about how many servings of carbs you can eat at each meal.  Eat 4-6 oz (112-168 g) of lean protein each day, such as lean meat, chicken, fish, eggs, or tofu. One ounce (oz) of lean protein is equal to: ? 1 oz (28 g) of meat, chicken, or fish. ? 1 egg. ?  cup (62 g) of tofu.  Eat some foods each day that contain healthy fats, such as avocado, nuts, seeds, and fish.   What foods should I eat? Fruits Berries. Apples. Oranges. Peaches. Apricots. Plums. Grapes. Mango. Papaya. Pomegranate. Kiwi. Cherries. Vegetables Lettuce. Spinach. Leafy greens, including kale, chard, collard greens, and mustard greens. Beets. Cauliflower. Cabbage. Broccoli. Carrots. Green beans. Tomatoes. Peppers. Onions. Cucumbers. Brussels sprouts. Grains Whole grains, such as whole-wheat or whole-grain bread, crackers, tortillas, cereal, and pasta. Unsweetened oatmeal. Quinoa. Brown or wild rice. Meats and other proteins Seafood. Poultry without skin. Lean cuts of poultry and beef. Tofu. Nuts. Seeds. Dairy Low-fat or fat-free dairy products such as milk, yogurt, and cheese. The items listed above may not be a complete list of foods and beverages you can eat. Contact a dietitian for more information. What foods should I avoid? Fruits Fruits canned with  syrup. Vegetables Canned vegetables. Frozen vegetables with butter or cream sauce. Grains Refined white flour and flour products such as bread, pasta, snack foods, and cereals. Avoid all processed foods. Meats and other proteins Fatty cuts of meat. Poultry with skin. Breaded or fried meats. Processed meat. Avoid saturated fats. Dairy Full-fat yogurt, cheese, or milk. Beverages Sweetened drinks, such as soda or iced tea. The items listed above may not be a complete list of foods and beverages you should avoid. Contact a dietitian for more information. Questions to ask a health care provider  Do I need to meet with a diabetes educator?  Do I need to meet with a dietitian?  What number can I call if I have questions?  When are the best times to check my blood glucose? Where to find more information:  American Diabetes Association: diabetes.org  Academy of Nutrition and Dietetics: www.eatright.org  National Institute of Diabetes and Digestive and Kidney Diseases: www.niddk.nih.gov  Association of Diabetes Care and Education Specialists: www.diabeteseducator.org Summary  It is important to have healthy eating   habits because your blood sugar (glucose) levels are greatly affected by what you eat and drink.  A healthy meal plan will help you control your blood glucose and maintain a healthy lifestyle.  Your health care provider may recommend that you work with a dietitian to make a meal plan that is best for you.  Keep in mind that carbohydrates (carbs) and alcohol have immediate effects on your blood glucose levels. It is important to count carbs and to use alcohol carefully. This information is not intended to replace advice given to you by your health care provider. Make sure you discuss any questions you have with your health care provider. Document Revised: 01/02/2019 Document Reviewed: 01/02/2019 Elsevier Patient Education  2021 Elsevier Inc.  

## 2020-05-28 ENCOUNTER — Other Ambulatory Visit: Payer: Self-pay | Admitting: Emergency Medicine

## 2020-05-28 DIAGNOSIS — E1059 Type 1 diabetes mellitus with other circulatory complications: Secondary | ICD-10-CM

## 2020-06-05 ENCOUNTER — Encounter: Payer: Self-pay | Admitting: Gastroenterology

## 2020-06-19 ENCOUNTER — Other Ambulatory Visit: Payer: Self-pay | Admitting: Emergency Medicine

## 2020-06-19 ENCOUNTER — Encounter: Payer: Self-pay | Admitting: Gastroenterology

## 2020-06-19 DIAGNOSIS — J309 Allergic rhinitis, unspecified: Secondary | ICD-10-CM

## 2020-06-19 DIAGNOSIS — R059 Cough, unspecified: Secondary | ICD-10-CM

## 2020-07-17 ENCOUNTER — Ambulatory Visit: Payer: 59 | Admitting: Internal Medicine

## 2020-08-06 ENCOUNTER — Encounter: Payer: Self-pay | Admitting: Physician Assistant

## 2020-08-06 ENCOUNTER — Ambulatory Visit: Payer: 59 | Admitting: Physician Assistant

## 2020-08-06 ENCOUNTER — Other Ambulatory Visit: Payer: Self-pay

## 2020-08-06 DIAGNOSIS — F319 Bipolar disorder, unspecified: Secondary | ICD-10-CM

## 2020-08-06 DIAGNOSIS — F418 Other specified anxiety disorders: Secondary | ICD-10-CM

## 2020-08-06 DIAGNOSIS — F900 Attention-deficit hyperactivity disorder, predominantly inattentive type: Secondary | ICD-10-CM

## 2020-08-06 DIAGNOSIS — F411 Generalized anxiety disorder: Secondary | ICD-10-CM | POA: Diagnosis not present

## 2020-08-06 DIAGNOSIS — F1021 Alcohol dependence, in remission: Secondary | ICD-10-CM

## 2020-08-06 MED ORDER — LAMOTRIGINE 150 MG PO TABS: 300.0000 mg | ORAL_TABLET | Freq: Every day | ORAL | 1 refills | Status: DC

## 2020-08-06 MED ORDER — MODAFINIL 200 MG PO TABS: 300.0000 mg | ORAL_TABLET | Freq: Every day | ORAL | 5 refills | Status: DC

## 2020-08-06 MED ORDER — ALPRAZOLAM 0.5 MG PO TABS: 0.2500 mg | ORAL_TABLET | Freq: Two times a day (BID) | ORAL | 1 refills | Status: DC | PRN

## 2020-08-06 MED ORDER — QUETIAPINE FUMARATE 400 MG PO TABS: 400.0000 mg | ORAL_TABLET | Freq: Every day | ORAL | 1 refills | Status: DC

## 2020-08-06 NOTE — Progress Notes (Signed)
[facility]  Patient ID: Karla Rodriguez,  MRN: [id number]  PCP: Horald Pollen, MD  Date of Evaluation: 08/06/2020 Time spent:30 minutes  Chief Complaint:  Chief Complaint   Anxiety; Depression; Insomnia; Follow-up     HISTORY/CURRENT STATUS: For 64-month med check.  Not doing well.  Found out last week that her step-daughter has Stage 3 breast cancer. She's going through a lot of testing now, so there are a lot of unknowns right now.  Karla Rodriguez has been extremely stressed the past 5 days after they learned this.  She rarely takes Xanax, and her wife is in control of it, 20 pills has lasted almost a year.  But in the past 5 days she has had to take it twice.  It is effective.  She is an alcoholic, sober for 11 years and understands the risk of taking benzos leading to addiction with those or the desire to drink again.  She ask about Ativan.  Her wife is a psychiatric nurse practitioner and has mentioned that to her, because it does not cause drowsiness usually and Xanax does.  However Karla Rodriguez states she does not get drowsy with the Xanax.  It just does its job.  States her psychiatric medications have been working very well.  She knows that this is a situational issue.  She is doing everything in her power, taking walks, drinking a calming tea, or things like that to help with the anxiety some.  Patient denies loss of interest in usual activities and is able to enjoy things.  Denies decreased energy or motivation.  Modafinil is very helpful for energy and motivation as well as focus somewhat.  Appetite has not changed.  No extreme sadness, tearfulness, or feelings of hopelessness.  Denies any changes in concentration, making decisions or remembering things.  Denies suicidal or homicidal thoughts.  Patient denies increased energy with decreased need for sleep, no increased talkativeness, no racing thoughts, no impulsivity or risky behaviors, no increased spending, no increased  libido, no grandiosity, no paranoia, and no hallucinations.  Denies dizziness, syncope, seizures, numbness, tingling, tremor, tics, unsteady gait, slurred speech, confusion. Denies muscle or joint pain, stiffness, or dystonia.  Her endocrinologist takes care of the known diabetes, and her cholesterol.  Individual Medical History/ Review of Systems: Changes? :No    Past medications for mental health diagnoses include: Xanax, Lexapro, Buspar, Rozerem, Saphris, Lamictal, Wellbutrin, Ambien, Nuvigil, Risperdal, Celexa, Seroquel, Latuda, Strattera, lithium  Allergies: Patient has no known allergies.  Current Medications:  Current Outpatient Medications:    cetirizine (ZYRTEC) 10 MG tablet, Take 10 mg by mouth daily., Disp: , Rfl:    Glucagon 3 MG/DOSE POWD, Place 3 mg into the nose once as needed for up to 1 dose., Disp: 1 each, Rfl: 11   insulin lispro (HUMALOG) 100 UNIT/ML injection, VIA INSULIN PUMP - 150 UNITS PER DAY, Disp: 150 mL, Rfl: 3   lisinopril (ZESTRIL) 10 MG tablet, TAKE 1 TABLET BY MOUTH EVERY DAY, Disp: 90 tablet, Rfl: 0   meclizine (ANTIVERT) 25 MG tablet, Take 1 tablet (25 mg total) by mouth 3 (three) times daily as needed for dizziness., Disp: 30 tablet, Rfl: 0   Melatonin 10 MG TABS, Take 10 mg by mouth at bedtime as needed., Disp: , Rfl:    metaxalone (SKELAXIN) 800 MG tablet, Take 1 tablet (800 mg total) by mouth 3 (three) times daily as needed for muscle spasms., Disp: 30 tablet, Rfl: 3   metFORMIN (GLUCOPHAGE-XR) 500 MG 24  hr tablet, TAKE 1 TABLET BY MOUTH TWICE A DAY, Disp: 180 tablet, Rfl: 3   montelukast (SINGULAIR) 10 MG tablet, TAKE 1 TABLET BY MOUTH EVERYDAY AT BEDTIME, Disp: 90 tablet, Rfl: 0   ONETOUCH VERIO test strip, USE AS DIRECTED. TESTING FREQUENCY: 4-6X/DAILY. (DX:E10.65), Disp: 500 each, Rfl: 5   pantoprazole (PROTONIX) 40 MG tablet, TAKE 1 TABLET DAILY (NEED OFFICE VISIT FOR REFILLS), Disp: 90 tablet, Rfl: 2   rosuvastatin (CRESTOR) 20 MG tablet, TAKE 1  TABLET BY MOUTH EVERYDAY AT BEDTIME, Disp: 90 tablet, Rfl: 0   ALPRAZolam (XANAX) 0.5 MG tablet, Take 0.5-1 tablets (0.25-0.5 mg total) by mouth 2 (two) times daily as needed for anxiety., Disp: 30 tablet, Rfl: 1   b complex vitamins capsule, Take 1 capsule by mouth daily. (Patient not taking: Reported on 08/06/2020), Disp: , Rfl:    Cholecalciferol (VITAMIN D PO), Take by mouth. (Patient not taking: Reported on 08/06/2020), Disp: , Rfl:    lamoTRIgine (LAMICTAL) 150 MG tablet, Take 2 tablets (300 mg total) by mouth at bedtime., Disp: 180 tablet, Rfl: 1   meloxicam (MOBIC) 15 MG tablet, TAKE 1 TABLET DAILY WITH FOOD FOR 10 DAYS. THEN TAKE AS NEEDED. (Patient not taking: Reported on 08/06/2020), Disp: 30 tablet, Rfl: 0   modafinil (PROVIGIL) 200 MG tablet, Take 1.5 tablets (300 mg total) by mouth daily., Disp: 45 tablet, Rfl: 5   QUEtiapine (SEROQUEL) 400 MG tablet, Take 1 tablet (400 mg total) by mouth at bedtime., Disp: 90 tablet, Rfl: 1   TURMERIC PO, Take by mouth. (Patient not taking: Reported on 08/06/2020), Disp: , Rfl:  Medication Side Effects: none  Family Medical/ Social History: Changes?  Yes, stepdaughter has just been diagnosed with breast cancer.  She is 53 years old.  MENTAL HEALTH EXAM:  There were no vitals taken for this visit.There is no height or weight on file to calculate BMI.  General Appearance: Casual, Neat, Well Groomed and Obese  Eye Contact:  Good  Speech:  Clear and Coherent and Normal Rate  Volume:  Normal  Mood:   Sad  Affect:  Appropriate and Congruent  Thought Process:  Goal Directed and Descriptions of Associations: Intact  Orientation:  Full (Time, Place, and Person)  Thought Content: Logical   Suicidal Thoughts:  No  Homicidal Thoughts:  No  Memory:  WNL  Judgement:  Good  Insight:  Good  Psychomotor Activity:  Normal  Concentration:  Concentration: Good and Attention Span: Good  Recall:  Good  Fund of Knowledge: Good  Language: Good  Assets:  Desire  for Improvement  ADL's:  Intact  Cognition: WNL  Prognosis:  Good   Endocrinology follows diabetes and hyperlipidemia.  DIAGNOSES:    ICD-10-CM   1. Situational anxiety  F41.8     2. Bipolar I disorder ([place])  F31.9     3. GAD (generalized anxiety disorder)  F41.1     4. Attention deficit hyperactivity disorder (ADHD), predominantly inattentive type  F90.0     5. 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  Last modafinil was filled 07/29/2020, I provided 30 minutes of face to face time during this encounter, including time spent before and after the visit in records review, medical decision making, and charting.  I am sorry to hear about her stepdaughter, but reminded her that medical advances and oncology have been remarkable in the past 20 years or  so and breast cancer can be cured. I would recommend she stay on the Xanax.  We know it does not cause her any side effects.  If we switched to Ativan then we would have to double the dose because it is not as potent as Xanax.  She would like to keep things the way they are.  I am increasing the quantity of the Xanax to 30 pills and put a refill on there.  And again, her wife will keep the Xanax and give her 1 when she needs it.   Continue Xanax 0.5 mg 1/2-1 twice daily as needed.   Continue Lamictal 150 mg, 1 p.o. twice daily. Continue modafinil 200 mg, 1.5 pills daily. Continue Seroquel 400 mg nightly. Continue melatonin 10 mg nightly as needed sleep. Return in 6 months.    Donnal Moat, PA-C

## 2020-08-22 ENCOUNTER — Telehealth: Payer: Self-pay | Admitting: *Deleted

## 2020-08-22 ENCOUNTER — Encounter: Payer: Self-pay | Admitting: Internal Medicine

## 2020-08-22 ENCOUNTER — Other Ambulatory Visit: Payer: Self-pay

## 2020-08-22 ENCOUNTER — Ambulatory Visit: Payer: 59 | Admitting: Internal Medicine

## 2020-08-22 VITALS — BP 128/78 | HR 94 | Ht 64.0 in | Wt 220.8 lb

## 2020-08-22 DIAGNOSIS — Z6836 Body mass index (BMI) 36.0-36.9, adult: Secondary | ICD-10-CM | POA: Diagnosis not present

## 2020-08-22 DIAGNOSIS — E7800 Pure hypercholesterolemia, unspecified: Secondary | ICD-10-CM | POA: Diagnosis not present

## 2020-08-22 DIAGNOSIS — E1010 Type 1 diabetes mellitus with ketoacidosis without coma: Secondary | ICD-10-CM | POA: Diagnosis not present

## 2020-08-22 LAB — POCT GLYCOSYLATED HEMOGLOBIN (HGB A1C): Hemoglobin A1C: 6.3 % — AB (ref 4.0–5.6)

## 2020-08-22 MED ORDER — LYUMJEV KWIKPEN 100 UNIT/ML ~~LOC~~ SOPN: PEN_INJECTOR | SUBCUTANEOUS | 5 refills | Status: DC

## 2020-08-22 NOTE — Patient Instructions (Addendum)
Please use the following pump settings: - basal rates: 12 am: 1.8 11 am: 1.85 1 pm: 1.65 3 pm: 1.05 6 pm: 1.20 - ICR:   12 am: 3.7   7 am: 3.5  11 am: 4.3  3:30 pm: 4.0  4:30 pm: 4.3  5 pm: 3.4 - target: 105-115 - ISF:  12 am: 20 11 am: 30 3 pm: 20 - Insulin on Board: 2:45 h  Try to switch to Lyumjev insulin.  Start the Lyumjev bolus at the start of each meal.  Please return in 4 month.

## 2020-08-22 NOTE — Telephone Encounter (Signed)
Pt has an insulin pump managed by Dr Cruzita Lederer- she has a colon scheduled for 8-16 with Dr Havery Moros  Can you obtain insulin pump instructions -   thx Lelan Pons
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
## 2020-08-24 ENCOUNTER — Other Ambulatory Visit: Payer: Self-pay | Admitting: Emergency Medicine

## 2020-08-24 DIAGNOSIS — I152 Hypertension secondary to endocrine disorders: Secondary | ICD-10-CM

## 2020-08-24 DIAGNOSIS — E1059 Type 1 diabetes mellitus with other circulatory complications: Secondary | ICD-10-CM

## 2020-08-25 NOTE — Telephone Encounter (Signed)
Please see instructions from Dr. Cruzita Lederer for patient's insulin pump. Thank you.

## 2020-08-25 NOTE — Telephone Encounter (Signed)
Karla Rodriguez 09-20-1965 [id number]   Dear Dr. Cruzita Lederer    Dr. Havery Moros has scheduled the above patient for a colonoscopy on 09-23-20.  Our records show that she is on insulin therapy via an insulin pump.  Our colonoscopy prep protocol requires that:  the patient must be on a clear liquid diet the entire day prior to the procedure date as well as the morning of the procedure  the patient must be NPO for 3 to 4 hours prior to the procedure   the patient must consume a PEG 3350 solution to prepare for the procedure.  Please advise [place] of any adjustments that need to be made to the patient's insulin pump therapy prior to the above procedure date.    Please route or fax back this completed form to me at [phone].  If you have any questions, please call me at [phone].  Thank you for your help with this matter.  Sincerely,  Lemar Lofty, CMA

## 2020-08-25 NOTE — Telephone Encounter (Signed)
Hi Karla Rodriguez, While n.p.o., she can continue with just the basal rates and only do boluses for correction. If sugars drop too low, she can have small sips of a sweet drink.  When she is resuming eating, she can start bolusing normally for food. Sincerely, Philemon Kingdom MD

## 2020-09-09 ENCOUNTER — Ambulatory Visit (AMBULATORY_SURGERY_CENTER): Payer: 59 | Admitting: *Deleted

## 2020-09-09 ENCOUNTER — Other Ambulatory Visit: Payer: Self-pay

## 2020-09-09 VITALS — Ht 65.0 in | Wt 210.0 lb

## 2020-09-09 DIAGNOSIS — Z1211 Encounter for screening for malignant neoplasm of colon: Secondary | ICD-10-CM

## 2020-09-09 MED ORDER — PEG-KCL-NACL-NASULF-NA ASC-C 100 G PO SOLR
1.0000 | Freq: Once | ORAL | 0 refills | Status: AC
Start: 2020-09-09 — End: 2020-09-09

## 2020-09-09 NOTE — Progress Notes (Signed)
No egg or soy allergy known to patient  No issues with past sedation with any surgeries or procedures Patient denies ever being told they had issues or difficulty with intubation  No FH of Malignant Hyperthermia No diet pills per patient No home 02 use per patient  No blood thinners per patient  Pt states issues with constipation - chronic  No A fib or A flutter  EMMI video to pt or via [place] 19 guidelines implemented in PV today with Pt and RN  Pt is fully vaccinated  for Covid   MOVI Coupon given to pt in PV today , Code to Pharmacy and  NO PA's for preps discussed with pt In PV today  Discussed with pt there will be an out-of-pocket cost for prep and that varies from $0 to 70 dollars   Due to the COVID-19 pandemic we are asking patients to follow certain guidelines.  Pt aware of COVID protocols and [facility] guidelines   Pt verified name, DOB, address and insurance during PV today. Pt mailed instruction packet to included paper to complete and mail back to [facility] with addressed and stamped envelope, Emmi video, copy of consent form to read and not return, and instructions. MOVI  coupon mailed in packet. PV completed over the phone. Pt encouraged to call with questions or issues. My Chart instructions to pt as well

## 2020-09-19 ENCOUNTER — Other Ambulatory Visit: Payer: Self-pay | Admitting: Emergency Medicine

## 2020-09-19 DIAGNOSIS — R059 Cough, unspecified: Secondary | ICD-10-CM

## 2020-09-19 DIAGNOSIS — J309 Allergic rhinitis, unspecified: Secondary | ICD-10-CM

## 2020-09-23 ENCOUNTER — Ambulatory Visit (AMBULATORY_SURGERY_CENTER): Payer: 59 | Admitting: Gastroenterology

## 2020-09-23 ENCOUNTER — Encounter: Payer: Self-pay | Admitting: Gastroenterology

## 2020-09-23 VITALS — BP 112/64 | HR 69 | Temp 97.7°F | Resp 11 | Ht 64.0 in

## 2020-09-23 DIAGNOSIS — Z1211 Encounter for screening for malignant neoplasm of colon: Secondary | ICD-10-CM

## 2020-09-23 MED ORDER — SODIUM CHLORIDE 0.9 % IV SOLN: 500.0000 mL | Freq: Once | INTRAVENOUS | Status: DC

## 2020-09-23 NOTE — Progress Notes (Signed)
VS by CW  Pt's states no medical or surgical changes since previsit or office visit.  

## 2020-09-23 NOTE — Progress Notes (Signed)
Report given to PACU, vss 

## 2020-09-23 NOTE — Patient Instructions (Signed)
YOU HAD AN ENDOSCOPIC PROCEDURE TODAY AT [facility]:   Refer to the procedure report that was given to you for any specific questions about what was found during the examination.  If the procedure report does not answer your questions, please call your gastroenterologist to clarify.  If you requested that your care partner not be given the details of your procedure findings, then the procedure report has been included in a sealed envelope for you to review at your convenience later.  YOU SHOULD EXPECT: Some feelings of bloating in the abdomen. Passage of more gas than usual.  Walking can help get rid of the air that was put into your GI tract during the procedure and reduce the bloating. If you had a lower endoscopy (such as a colonoscopy or flexible sigmoidoscopy) you may notice spotting of blood in your stool or on the toilet paper. If you underwent a bowel prep for your procedure, you may not have a normal bowel movement for a few days.  Please Note:  You might notice some irritation and congestion in your nose or some drainage.  This is from the oxygen used during your procedure.  There is no need for concern and it should clear up in a day or so.  SYMPTOMS TO REPORT IMMEDIATELY:  Following lower endoscopy (colonoscopy or flexible sigmoidoscopy):  Excessive amounts of blood in the stool  Significant tenderness or worsening of abdominal pains  Swelling of the abdomen that is new, acute  Fever of 100F or higher  For urgent or emergent issues, a gastroenterologist can be reached at any hour by calling [phone]. Do not use MyChart messaging for urgent concerns.    DIET:  We do recommend a small meal at first, but then you may proceed to your regular diet.  Drink plenty of fluids but you should avoid alcoholic beverages for 24 hours.  ACTIVITY:  You should plan to take it easy for the rest of today and you should NOT DRIVE or use heavy machinery until tomorrow (because of  the sedation medicines used during the test).    FOLLOW UP: Our staff will call the number listed on your records 48-72 hours following your procedure to check on you and address any questions or concerns that you may have regarding the information given to you following your procedure. If we do not reach you, we will leave a message.  We will attempt to reach you two times.  During this call, we will ask if you have developed any symptoms of COVID 19. If you develop any symptoms (ie: fever, flu-like symptoms, shortness of breath, cough etc.) before then, please call [phone].  If you test positive for Covid 19 in the 2 weeks post procedure, please call and report this information to [place].    If any biopsies were taken you will be contacted by phone or by letter within the next 1-3 weeks.  Please call us at [phone] if you have not heard about the biopsies in 3 weeks.    SIGNATURES/CONFIDENTIALITY: You and/or your care partner have signed paperwork which will be entered into your electronic medical record.  These signatures attest to the fact that that the information above on your After Visit Summary has been reviewed and is understood.  Full responsibility of the confidentiality of this discharge information lies with you and/or your care-partner.    Resume medications. Information given on hemorrhoids.
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
## 2020-09-23 NOTE — Op Note (Signed)
[name] Patient Name: Karla Rodriguez Procedure Date: 09/23/2020 9:36 AM MRN: [id number] Endoscopist: Remo Lipps P. Havery Moros , MD Age: 55 Referring MD:  Date of Birth: 1966-01-17 Gender: Female Account #: [id number] Procedure:                Colonoscopy Indications:              Screening for colorectal malignant neoplasm Medicines:                Monitored Anesthesia Care Procedure:                Pre-Anesthesia Assessment:                           - Prior to the procedure, a History and Physical                            was performed, and patient medications and                            allergies were reviewed. The patient's tolerance of                            previous anesthesia was also reviewed. The risks                            and benefits of the procedure and the sedation                            options and risks were discussed with the patient.                            All questions were answered, and informed consent                            was obtained. Prior Anticoagulants: The patient has                            taken no previous anticoagulant or antiplatelet                            agents. ASA Grade Assessment: II - A patient with                            mild systemic disease. After reviewing the risks                            and benefits, the patient was deemed in                            satisfactory condition to undergo the procedure.                           After obtaining informed consent, the colonoscope  was passed under direct vision. Throughout the                            procedure, the patient's blood pressure, pulse, and                            oxygen saturations were monitored continuously. The                            Olympus [id number]-H190DL [id number]) Colonoscope was                            introduced through the anus and advanced to the the                            cecum,  identified by appendiceal orifice and                            ileocecal valve. The colonoscopy was performed                            without difficulty. The patient tolerated the                            procedure well. The quality of the bowel                            preparation was adequate. The ileocecal valve,                            appendiceal orifice, and rectum were photographed. Scope In: 9:41:54 AM Scope Out: 10:10:14 AM Scope Withdrawal Time: 0 hours 22 minutes 41 seconds  Total Procedure Duration: 0 hours 28 minutes 20 seconds  Findings:                 The perianal and digital rectal examinations were                            normal.                           A large amount of liquid stool was found in the                            entire colon, making visualization difficult.                            Several minutes per spent during this exam to                            lavage the entire colon using copious amounts of                            sterile water, resulting in clearance with adequate  visualization. This process prolonged the exam.                           Internal hemorrhoids were found during retroflexion.                           The exam was otherwise without abnormality. Complications:            No immediate complications. Estimated blood loss:                            None. Estimated Blood Loss:     Estimated blood loss: none. Impression:               - Residual stool in the colon, several minutes                            spent lavaging the colon to obtain adequate views.                           - Internal hemorrhoids.                           - The examination was otherwise normal.                           - No polyps. Recommendation:           - Patient has a contact number available for                            emergencies. The signs and symptoms of potential                             delayed complications were discussed with the                            patient. Return to normal activities tomorrow.                            Written discharge instructions were provided to the                            patient.                           - Resume previous diet.                           - Continue present medications.                           - Repeat colonoscopy in 10 years for screening                            purposes. Recommend double prep for next exam Remo Lipps P. Holdyn Poyser, MD 09/23/2020 10:14:20 AM This report has been signed electronically.

## 2020-09-25 ENCOUNTER — Telehealth: Payer: Self-pay

## 2020-09-25 NOTE — Telephone Encounter (Signed)
Left message on answering machine. 

## 2020-09-26 ENCOUNTER — Other Ambulatory Visit: Payer: Self-pay

## 2020-09-26 ENCOUNTER — Encounter: Payer: Self-pay | Admitting: Psychiatry

## 2020-09-26 ENCOUNTER — Ambulatory Visit: Payer: 59 | Admitting: Psychiatry

## 2020-09-26 DIAGNOSIS — F411 Generalized anxiety disorder: Secondary | ICD-10-CM | POA: Diagnosis not present

## 2020-09-26 NOTE — Progress Notes (Signed)
[facility] Counselor/Therapist Progress Note  Patient ID: Karla Rodriguez, MRN: [id number],    Date: 09/26/2020  Time Spent: 58 minutes start time 12:02 PM end time 1 PM  Treatment Type: Individual Therapy  Reported Symptoms: triggered responses, sadness, anxiety,irritability, panic, powerless, anger  Mental Status Exam:  Appearance:   Well Groomed     Behavior:  Appropriate  Motor:  Normal  Speech/Language:   Normal Rate  Affect:  Appropriate  Mood:  anxious and sad  Thought process:  normal  Thought content:    WNL  Sensory/Perceptual disturbances:    WNL  Orientation:  oriented to person, place, time/date, and situation  Attention:  Good  Concentration:  Good  Memory:  WNL  Fund of knowledge:   Good  Insight:    Good  Judgment:   Good  Impulse Control:  Good   Risk Assessment: Danger to Self:  No Self-injurious Behavior: No Danger to Others: No Duty to Warn:no Physical Aggression / Violence:No  Access to Firearms a concern: No  Gang Involvement:No   Subjective: Patient was present for session.  Updated history with patient.  She shared she was returning to treatment due to getting very triggered at a visit with her family a month ago.She shared she had gone without her girlfriend and it was bad.  She shared that she struggled with being able to work through it all and it had caused her to start being more needy, insecure, and irritable. She shared that in her wife's mind things are good but she isn't sure she has been happy in the marriage for a while. Her wife has gotten her degree and is starting her own practice and is doing well.  She shared that it is hard when she brings up issues with her wife.  She explained that her wife seems to not want to hear what she has to say and the conversations do not go very for and nothing changes.  Four weeks ago they found out her 53 year old step daughter has stage 4 breast cancer.  She and her husband have moved in with them so  that she can get through her treatment. 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 She recognized she is the environment controller t and that has been hard with t them being in her home because they are not cleaning up behind themselves. There was an incident when it all get too much and she had to leave. I am not sure where I fit.  Patient reported.  Encouraged her to recognize she is not capable of doing everything and that she may have to delegate some of the things that have to be done.  Patient was encouraged to make a list of all the chores and ask people for some assistance.  She was also encouraged to spend more time out of her home since she works from home and is taking care of everything it has to be very overwhelming.  She was encouraged to find some things that she can do for her own self-care since her wife is engaging in activities for her self-care and so is her daughter and son-in-law.  Patient agreed to try an make some small changes and work towards self-care.  She asked that regular sessions start to be established so that she can continue to release her negative emotions appropriately.  Interventions: Solution-Oriented/Positive Psychology  Diagnosis:   ICD-10-CM   1. GAD (generalized anxiety disorder)  F41.1       Plan: Patient is to use CBT and coping skills to decrease anxiety symptoms.  Patient is to work on some limit setting and asked for some assistance and taking care of the house.  Patient is to work on doing some things for herself and exercising regularly.  Patient is to take medication as directed  Lina Sayre, [facility]

## 2020-09-29 ENCOUNTER — Other Ambulatory Visit: Payer: Self-pay | Admitting: Physician Assistant

## 2020-09-30 NOTE — Telephone Encounter (Signed)
Last filled 6/21

## 2020-10-07 ENCOUNTER — Other Ambulatory Visit: Payer: Self-pay | Admitting: Physician Assistant

## 2020-10-16 NOTE — Telephone Encounter (Signed)
Na

## 2020-10-25 ENCOUNTER — Other Ambulatory Visit: Payer: Self-pay | Admitting: Internal Medicine

## 2020-10-25 DIAGNOSIS — E1010 Type 1 diabetes mellitus with ketoacidosis without coma: Secondary | ICD-10-CM

## 2020-11-20 ENCOUNTER — Ambulatory Visit: Payer: 59 | Admitting: Emergency Medicine

## 2020-11-23 ENCOUNTER — Other Ambulatory Visit: Payer: Self-pay | Admitting: Emergency Medicine

## 2020-11-23 DIAGNOSIS — E1059 Type 1 diabetes mellitus with other circulatory complications: Secondary | ICD-10-CM

## 2020-11-23 DIAGNOSIS — I152 Hypertension secondary to endocrine disorders: Secondary | ICD-10-CM

## 2020-11-27 ENCOUNTER — Ambulatory Visit: Payer: 59 | Admitting: Psychiatry

## 2020-12-18 ENCOUNTER — Other Ambulatory Visit: Payer: Self-pay | Admitting: Emergency Medicine

## 2020-12-18 DIAGNOSIS — J309 Allergic rhinitis, unspecified: Secondary | ICD-10-CM

## 2020-12-18 DIAGNOSIS — R059 Cough, unspecified: Secondary | ICD-10-CM

## 2020-12-23 ENCOUNTER — Other Ambulatory Visit: Payer: Self-pay | Admitting: Emergency Medicine

## 2020-12-29 ENCOUNTER — Ambulatory Visit: Payer: 59 | Admitting: Internal Medicine
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
## 2021-01-01 ENCOUNTER — Other Ambulatory Visit: Payer: Self-pay | Admitting: Physician Assistant

## 2021-01-23 ENCOUNTER — Ambulatory Visit: Payer: 59 | Admitting: Physician Assistant

## 2021-01-23 ENCOUNTER — Other Ambulatory Visit: Payer: Self-pay

## 2021-01-23 ENCOUNTER — Encounter: Payer: Self-pay | Admitting: Physician Assistant

## 2021-01-23 DIAGNOSIS — F900 Attention-deficit hyperactivity disorder, predominantly inattentive type: Secondary | ICD-10-CM | POA: Diagnosis not present

## 2021-01-23 DIAGNOSIS — F1021 Alcohol dependence, in remission: Secondary | ICD-10-CM | POA: Diagnosis not present

## 2021-01-23 DIAGNOSIS — F319 Bipolar disorder, unspecified: Secondary | ICD-10-CM | POA: Diagnosis not present

## 2021-01-23 DIAGNOSIS — F411 Generalized anxiety disorder: Secondary | ICD-10-CM

## 2021-01-23 MED ORDER — QUETIAPINE FUMARATE 400 MG PO TABS: 400.0000 mg | ORAL_TABLET | Freq: Every day | ORAL | 1 refills | Status: DC

## 2021-01-23 NOTE — Progress Notes (Signed)
[facility] Med Check  Patient ID: Karla Rodriguez,  MRN: [id number]  PCP: Horald Pollen, MD  Date of Evaluation: 01/23/2021 Time spent:20 minutes  Chief Complaint:  Chief Complaint   Anxiety; Depression; Follow-up      HISTORY/CURRENT STATUS: For 62-month med check.    Karla Rodriguez has been more anxious over the past few months.  Changes at work with a new boss and tending that she manages have been really stressful.  She does take the Xanax occasionally.  Her wife is in charge of the Xanax and does not give it to her unless she absolutely needs it.  Karla Rodriguez is very cautious with that drug, she knows that it can lead to cravings and she does not want to go there.  She and her wife had a big argument sometime in the past few months and Karla Rodriguez was really upset and went to a bar, but stopped herself from ordering a drink.  She has not had any alcohol in a very long time and is being proactive now more AA meetings because she does not want to slip up.  Another stressor is that her stepdaughter and son-in-law have moved in with her and her wife so that has been very hard.  Karla Rodriguez moved into the finished basement just so she can set some boundaries.  She is also seeing a counselor now.  Her step-daughter was dx w/ Stage 3 breast cancer earlier this year and is doing well under the circumstances.  Her most recent scans show no further metastasis and many of the lesion she had are "shadows" on the scans.  So all things considered, she is doing well.  Patient denies loss of interest in usual activities and is able to enjoy things.  Denies decreased energy or motivation.  Appetite has not changed.  No extreme sadness, tearfulness, or feelings of hopelessness.  Denies any changes in concentration, making decisions or remembering things.  Denies suicidal or homicidal thoughts.  Patient denies increased energy with decreased need for sleep, no increased talkativeness, no racing thoughts, no impulsivity or  risky behaviors, no increased spending, no increased libido, no grandiosity, no paranoia, and no hallucinations.  Denies dizziness, syncope, seizures, numbness, tingling, tremor, tics, unsteady gait, slurred speech, confusion. Denies muscle or joint pain, stiffness, or dystonia.  Her endocrinologist takes care of the known diabetes, and her cholesterol.  Individual Medical History/ Review of Systems: Changes? :No    Past medications for mental health diagnoses include: Xanax, Lexapro, Buspar, Rozerem, Saphris, Lamictal, Wellbutrin, Ambien, Nuvigil, Risperdal, Celexa, Seroquel, Latuda, Strattera, lithium  Allergies: Patient has no known allergies.  Current Medications:  Current Outpatient Medications:    ALPRAZolam (XANAX) 0.5 MG tablet, TAKE 0.5-1 TABLETS (0.25-0.5 MG TOTAL) BY MOUTH 2 (TWO) TIMES DAILY AS NEEDED FOR ANXIETY., Disp: 30 tablet, Rfl: 1   cetirizine (ZYRTEC) 10 MG tablet, Take 10 mg by mouth daily., Disp: , Rfl:    Cholecalciferol (VITAMIN D PO), Take by mouth., Disp: , Rfl:    Glucagon 3 MG/DOSE POWD, Place 3 mg into the nose once as needed for up to 1 dose., Disp: 1 each, Rfl: 11   Insulin Lispro-aabc, 1 U Dial, (LYUMJEV KWIKPEN) 100 UNIT/ML SOPN, Use up to 175 units in the pump daily, Disp: 50 mL, Rfl: 5   lamoTRIgine (LAMICTAL) 150 MG tablet, TAKE 2 TABLETS AT BEDTIME, Disp: 180 tablet, Rfl: 1   lisinopril (ZESTRIL) 10 MG tablet, TAKE 1 TABLET BY MOUTH EVERY DAY, Disp: 90 tablet, Rfl: 0  meclizine (ANTIVERT) 25 MG tablet, Take 1 tablet (25 mg total) by mouth 3 (three) times daily as needed for dizziness., Disp: 30 tablet, Rfl: 0   Melatonin 10 MG TABS, Take 10 mg by mouth at bedtime as needed., Disp: , Rfl:    metaxalone (SKELAXIN) 800 MG tablet, Take 1 tablet (800 mg total) by mouth 3 (three) times daily as needed for muscle spasms., Disp: 30 tablet, Rfl: 3   metFORMIN (GLUCOPHAGE-XR) 500 MG 24 hr tablet, TAKE 1 TABLET BY MOUTH TWICE A DAY, Disp: 180 tablet, Rfl: 3    modafinil (PROVIGIL) 200 MG tablet, TAKE 1.5 TABLETS BY MOUTH DAILY., Disp: 45 tablet, Rfl: 5   montelukast (SINGULAIR) 10 MG tablet, TAKE 1 TABLET BY MOUTH EVERYDAY AT BEDTIME, Disp: 90 tablet, Rfl: 0   ONETOUCH VERIO test strip, USE AS DIRECTED. TESTING FREQUENCY: 4-6X/DAILY. (DX:E10.65), Disp: 500 each, Rfl: 5   pantoprazole (PROTONIX) 40 MG tablet, TAKE 1 TABLET DAILY (NEED OFFICE VISIT FOR REFILLS), Disp: 90 tablet, Rfl: 3   rosuvastatin (CRESTOR) 20 MG tablet, TAKE 1 TABLET BY MOUTH EVERYDAY AT BEDTIME, Disp: 90 tablet, Rfl: 0   TURMERIC PO, Take by mouth., Disp: , Rfl:    QUEtiapine (SEROQUEL) 400 MG tablet, Take 1 tablet (400 mg total) by mouth at bedtime., Disp: 90 tablet, Rfl: 1 Medication Side Effects: none  Family Medical/ Social History: Changes?  See HPI.  MENTAL HEALTH EXAM:  There were no vitals taken for this visit.There is no height or weight on file to calculate BMI.  General Appearance: Casual, Neat, Well Groomed and Obese  Eye Contact:  Good  Speech:  Clear and Coherent and Normal Rate  Volume:  Normal  Mood:  Euthymic  Affect:  Appropriate and Congruent  Thought Process:  Goal Directed and Descriptions of Associations: Intact  Orientation:  Full (Time, Place, and Person)  Thought Content: Logical   Suicidal Thoughts:  No  Homicidal Thoughts:  No  Memory:  WNL  Judgement:  Good  Insight:  Good  Psychomotor Activity:  Normal  Concentration:  Concentration: Good and Attention Span: Good  Recall:  Good  Fund of Knowledge: Good  Language: Good  Assets:  Desire for Improvement  ADL's:  Intact  Cognition: WNL  Prognosis:  Good   Endocrinology follows diabetes and hyperlipidemia.  DIAGNOSES:    ICD-10-CM   1. Bipolar I disorder ([place])  F31.9     2. GAD (generalized anxiety disorder)  F41.1     3. Attention deficit hyperactivity disorder (ADHD), predominantly inattentive type  F90.0     4. Alcoholism in recovery [facility])  F10.21         Receiving  Psychotherapy: Yes   Peggy Hanes   RECOMMENDATIONS:  PDMP was reviewed.  Last Xanax filled 01/05/2021.  Modafinil filled 12/24/2020. I provided 20 minutes of face to face time during this encounter, including time spent before and after the visit in records review, medical decision making, counseling pertinent to today's visit, and charting.  She is doing well as far as medications go so no changes will be made.  I am glad she is seeing a Social worker.  She knows to contact me if she feels like any of her problems are not just related to stress, that they may be related to need to change medications. Continue Xanax 0.5 mg 1/2-1 twice daily as needed.  (She does not have access to the bottle.  Her wife gives her a dose when absolutely necessary.) Continue Lamictal 150 mg,  2 po qhs. Continue modafinil 200 mg, 1.5 pills daily. Continue Seroquel 400 mg nightly. Continue melatonin 10 mg nightly as needed sleep. Continue therapy. Continue AA meetings. Return in 6 months.    Donnal Moat, PA-C

## 2021-01-27 ENCOUNTER — Ambulatory Visit: Payer: 59 | Admitting: Psychiatry

## 2021-01-29 ENCOUNTER — Ambulatory Visit: Payer: 59 | Admitting: Internal Medicine
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
## 2021-02-21 ENCOUNTER — Other Ambulatory Visit: Payer: Self-pay | Admitting: Emergency Medicine

## 2021-02-21 DIAGNOSIS — E1059 Type 1 diabetes mellitus with other circulatory complications: Secondary | ICD-10-CM

## 2021-02-21 DIAGNOSIS — I152 Hypertension secondary to endocrine disorders: Secondary | ICD-10-CM

## 2021-03-03 ENCOUNTER — Encounter: Payer: Self-pay | Admitting: Internal Medicine

## 2021-03-03 ENCOUNTER — Ambulatory Visit (INDEPENDENT_AMBULATORY_CARE_PROVIDER_SITE_OTHER): Payer: BC Managed Care – PPO | Admitting: Internal Medicine

## 2021-03-03 ENCOUNTER — Other Ambulatory Visit: Payer: Self-pay

## 2021-03-03 VITALS — BP 128/82 | HR 99 | Ht 64.0 in | Wt 226.6 lb

## 2021-03-03 DIAGNOSIS — Z6836 Body mass index (BMI) 36.0-36.9, adult: Secondary | ICD-10-CM | POA: Diagnosis not present

## 2021-03-03 DIAGNOSIS — E1010 Type 1 diabetes mellitus with ketoacidosis without coma: Secondary | ICD-10-CM | POA: Diagnosis not present

## 2021-03-03 DIAGNOSIS — E7800 Pure hypercholesterolemia, unspecified: Secondary | ICD-10-CM | POA: Diagnosis not present

## 2021-03-03 LAB — COMPLETE METABOLIC PANEL WITH GFR
AG Ratio: 2.3 (calc) (ref 1.0–2.5)
ALT: 16 U/L (ref 6–29)
AST: 16 U/L (ref 10–35)
Albumin: 5.1 g/dL (ref 3.6–5.1)
Alkaline phosphatase (APISO): 84 U/L (ref 37–153)
BUN: 13 mg/dL (ref 7–25)
CO2: 31 mmol/L (ref 20–32)
Calcium: 9.9 mg/dL (ref 8.6–10.4)
Chloride: 102 mmol/L (ref 98–110)
Creat: 0.72 mg/dL (ref 0.50–1.03)
Globulin: 2.2 g/dL (calc) (ref 1.9–3.7)
Glucose, Bld: 204 mg/dL — ABNORMAL HIGH (ref 65–99)
Potassium: 4.7 mmol/L (ref 3.5–5.3)
Sodium: 139 mmol/L (ref 135–146)
Total Bilirubin: 0.4 mg/dL (ref 0.2–1.2)
Total Protein: 7.3 g/dL (ref 6.1–8.1)
eGFR: 100 mL/min/{1.73_m2} (ref >=60)

## 2021-03-03 LAB — POCT GLYCOSYLATED HEMOGLOBIN (HGB A1C): Hemoglobin A1C: 6.8 % — AB (ref 4.0–5.6)

## 2021-03-03 NOTE — Patient Instructions (Addendum)
Please use the following pump settings: - basal rates: 12 am: 1.95 11 am: 1.9 1 pm: 2.0 3 pm: 1.25 - ICR:   12 am: 3.7  7 am: 3.5  11 am: 4.3  3:30 pm: 4.0  4:30 pm: 4.3  5 pm: 3.4 - target: 105-115 - ISF:  12 am: 20 11 am: 30 3 pm: 20 - Insulin on Board: 2:45 h - bolus wizard: on  Check with your insurance if the following pumps are covered: - t: slim X2 - Omnipod 5 (or the DASH)  Please return in 4 month.
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
## 2021-03-04 LAB — MICROALBUMIN / CREATININE URINE RATIO
Creatinine,U: 23.9 mg/dL
Microalb Creat Ratio: 2.9 mg/g (ref 0.0–30.0)
Microalb, Ur: <0.7 mg/dL (ref 0.0–1.9)

## 2021-03-04 LAB — LIPID PANEL
Cholesterol: 135 mg/dL (ref 0–200)
HDL: 54.7 mg/dL (ref >39.00)
LDL Cholesterol: 54 mg/dL (ref 0–99)
NonHDL: 80.23
Total CHOL/HDL Ratio: 2
Triglycerides: 132 mg/dL (ref 0.0–149.0)
VLDL: 26.4 mg/dL (ref 0.0–40.0)

## 2021-03-04 LAB — TSH: TSH: 1.86 u[IU]/mL (ref 0.35–5.50)

## 2021-03-20 ENCOUNTER — Other Ambulatory Visit: Payer: Self-pay | Admitting: Emergency Medicine

## 2021-03-20 DIAGNOSIS — J309 Allergic rhinitis, unspecified: Secondary | ICD-10-CM

## 2021-03-20 DIAGNOSIS — R059 Cough, unspecified: Secondary | ICD-10-CM

## 2021-03-24 ENCOUNTER — Other Ambulatory Visit: Payer: Self-pay

## 2021-03-24 ENCOUNTER — Telehealth: Payer: Self-pay | Admitting: Physician Assistant

## 2021-03-24 MED ORDER — MODAFINIL 200 MG PO TABS: ORAL_TABLET | ORAL | 0 refills | Status: DC

## 2021-03-24 NOTE — Telephone Encounter (Signed)
Noted  

## 2021-03-24 NOTE — Telephone Encounter (Signed)
Without her insurance information it's hard to do a PA on her Modafinil.  The pharmacy didn't send me anything.  She is asking for it to go to [facility] and she will use [facility]. I will pend.  I will also do a PA as soon as I have her information.

## 2021-03-24 NOTE — Telephone Encounter (Signed)
Addendum to attached message. Julanne called and said that she would like to get her Modafinil transferred from [facility] [address] to [place] at [facility] [place]. Phone number is [phone]. It will be cheaper because its [facility] and she will use [facility]. Her number is [phone].

## 2021-03-24 NOTE — Telephone Encounter (Signed)
Pt lvm that her modanifil needs a PA. She is out of meds. She has a new Oncologist. We don't have a copy of her new card. Please call her at [phone]

## 2021-04-01 ENCOUNTER — Other Ambulatory Visit: Payer: Self-pay | Admitting: Internal Medicine

## 2021-04-01 DIAGNOSIS — E1010 Type 1 diabetes mellitus with ketoacidosis without coma: Secondary | ICD-10-CM

## 2021-04-21 ENCOUNTER — Other Ambulatory Visit: Payer: Self-pay | Admitting: Internal Medicine

## 2021-04-22 ENCOUNTER — Other Ambulatory Visit: Payer: Self-pay | Admitting: Physician Assistant

## 2021-04-24 DIAGNOSIS — G4733 Obstructive sleep apnea (adult) (pediatric): Secondary | ICD-10-CM | POA: Diagnosis not present

## 2021-05-20 ENCOUNTER — Other Ambulatory Visit: Payer: Self-pay | Admitting: Emergency Medicine

## 2021-05-20 DIAGNOSIS — I152 Hypertension secondary to endocrine disorders: Secondary | ICD-10-CM

## 2021-05-21 ENCOUNTER — Other Ambulatory Visit: Payer: Self-pay | Admitting: Physician Assistant

## 2021-06-01 DIAGNOSIS — E109 Type 1 diabetes mellitus without complications: Secondary | ICD-10-CM | POA: Diagnosis not present

## 2021-06-01 DIAGNOSIS — Z794 Long term (current) use of insulin: Secondary | ICD-10-CM | POA: Diagnosis not present

## 2021-06-08 ENCOUNTER — Other Ambulatory Visit: Payer: Self-pay | Admitting: Physician Assistant

## 2021-06-27 ENCOUNTER — Other Ambulatory Visit: Payer: Self-pay | Admitting: Emergency Medicine

## 2021-06-28 ENCOUNTER — Other Ambulatory Visit: Payer: Self-pay | Admitting: Emergency Medicine

## 2021-06-28 DIAGNOSIS — J309 Allergic rhinitis, unspecified: Secondary | ICD-10-CM

## 2021-06-28 DIAGNOSIS — R059 Cough, unspecified: Secondary | ICD-10-CM

## 2021-07-02 ENCOUNTER — Ambulatory Visit: Payer: BC Managed Care – PPO | Admitting: Internal Medicine

## 2021-07-24 ENCOUNTER — Ambulatory Visit (INDEPENDENT_AMBULATORY_CARE_PROVIDER_SITE_OTHER): Payer: BC Managed Care – PPO | Admitting: Physician Assistant

## 2021-07-24 ENCOUNTER — Encounter: Payer: Self-pay | Admitting: Physician Assistant

## 2021-07-24 DIAGNOSIS — F319 Bipolar disorder, unspecified: Secondary | ICD-10-CM | POA: Diagnosis not present

## 2021-07-24 DIAGNOSIS — F1021 Alcohol dependence, in remission: Secondary | ICD-10-CM

## 2021-07-24 DIAGNOSIS — F411 Generalized anxiety disorder: Secondary | ICD-10-CM

## 2021-07-24 MED ORDER — MODAFINIL 200 MG PO TABS: 300.0000 mg | ORAL_TABLET | Freq: Every day | ORAL | 5 refills | Status: DC

## 2021-07-24 MED ORDER — QUETIAPINE FUMARATE 400 MG PO TABS: 400.0000 mg | ORAL_TABLET | Freq: Every day | ORAL | 1 refills | Status: DC

## 2021-07-24 MED ORDER — LAMOTRIGINE 150 MG PO TABS: 150.0000 mg | ORAL_TABLET | Freq: Every day | ORAL | 1 refills | Status: DC

## 2021-07-24 NOTE — Progress Notes (Unsigned)
[facility] Check  Patient ID: EASTON SIVERTSON,  MRN: [id number]  PCP: Horald Pollen, MD  Date of Evaluation: 07/24/2021 Time spent:20 minutes  Chief Complaint:  Chief Complaint   Anxiety; Follow-up     HISTORY/CURRENT STATUS: For 38-monthmed check.    More anxious. PA w/ f/f  Stress at work   Denies dizziness, syncope, seizures, numbness, tingling, tremor, tics, unsteady gait, slurred speech, confusion. Denies muscle or joint pain, stiffness, or dystonia.  Her endocrinologist takes care of the known diabetes, and her cholesterol.  Individual Medical History/ Review of Systems: Changes? :No    Past medications for mental health diagnoses include: Xanax, Lexapro, Buspar, Rozerem, Saphris, Lamictal, Wellbutrin, Ambien, Nuvigil, Risperdal, Celexa, Seroquel, Latuda, Strattera, lithium  Allergies: Patient has no known allergies.  Current Medications:  Current Outpatient Medications:    ALPRAZolam (XANAX) 0.5 MG tablet, TAKE 0.5-1 TABLETS (0.25-0.5 MG TOTAL) BY MOUTH 2 (TWO) TIMES DAILY AS NEEDED FOR ANXIETY., Disp: 30 tablet, Rfl: 1   insulin lispro (HUMALOG) 100 UNIT/ML injection, VIA INSULIN PUMP - 150 UNITS PER DAY, Disp: 150 mL, Rfl: 3   lisinopril (ZESTRIL) 10 MG tablet, TAKE 1 TABLET BY MOUTH EVERY DAY, Disp: 90 tablet, Rfl: 0   meclizine (ANTIVERT) 25 MG tablet, Take 1 tablet (25 mg total) by mouth 3 (three) times daily as needed for dizziness., Disp: 30 tablet, Rfl: 0   metaxalone (SKELAXIN) 800 MG tablet, Take 1 tablet (800 mg total) by mouth 3 (three) times daily as needed for muscle spasms., Disp: 30 tablet, Rfl: 3   ONETOUCH VERIO test strip, USE AS DIRECTED. TESTING FREQUENCY: 4-6X/DAILY. (DX:E10.65), Disp: 500 each, Rfl: 5   pantoprazole (PROTONIX) 40 MG tablet, TAKE 1 TABLET DAILY (NEED OFFICE VISIT FOR REFILLS), Disp: 90 tablet, Rfl: 3   rosuvastatin (CRESTOR) 20 MG tablet, TAKE 1 TABLET BY MOUTH EVERYDAY AT BEDTIME, Disp: 90 tablet, Rfl: 0   TURMERIC  PO, Take by mouth., Disp: , Rfl:    UNABLE TO FIND, [place], Disp: , Rfl:    cetirizine (ZYRTEC) 10 MG tablet, Take 10 mg by mouth daily. (Patient not taking: Reported on 07/24/2021), Disp: , Rfl:    Cholecalciferol (VITAMIN D PO), Take by mouth. (Patient not taking: Reported on 07/24/2021), Disp: , Rfl:    Glucagon 3 MG/DOSE POWD, Place 3 mg into the nose once as needed for up to 1 dose. (Patient not taking: Reported on 07/24/2021), Disp: 1 each, Rfl: 11   lamoTRIgine (LAMICTAL) 150 MG tablet, Take 1 tablet (150 mg total) by mouth daily., Disp: 90 tablet, Rfl: 1   Melatonin 10 MG TABS, Take 10 mg by mouth at bedtime as needed. (Patient not taking: Reported on 07/24/2021), Disp: , Rfl:    metFORMIN (GLUCOPHAGE-XR) 500 MG 24 hr tablet, TAKE 1 TABLET BY MOUTH TWICE A DAY (Patient not taking: Reported on 07/24/2021), Disp: 180 tablet, Rfl: 3   modafinil (PROVIGIL) 200 MG tablet, Take 1.5 tablets (300 mg total) by mouth daily., Disp: 45 tablet, Rfl: 5   montelukast (SINGULAIR) 10 MG tablet, TAKE 1 TABLET BY MOUTH EVERYDAY AT BEDTIME (Patient not taking: Reported on 07/24/2021), Disp: 90 tablet, Rfl: 0   QUEtiapine (SEROQUEL) 400 MG tablet, Take 1 tablet (400 mg total) by mouth at bedtime., Disp: 90 tablet, Rfl: 1 Medication Side Effects: none  Family Medical/ Social History: Changes?  Step-daughter,  24yo,  has Stage IV breast cancer.  MENTAL HEALTH EXAM:  There were no vitals taken for this visit.There is  no height or weight on file to calculate BMI.  General Appearance: Casual, Neat, Well Groomed and Obese  Eye Contact:  Good  Speech:  Clear and Coherent and Normal Rate  Volume:  Normal  Mood:  Euthymic  Affect:  Congruent  Thought Process:  Goal Directed and Descriptions of Associations: Intact  Orientation:  Full (Time, Place, and Person)  Thought Content: Logical   Suicidal Thoughts:  No  Homicidal Thoughts:  No  Memory:  WNL  Judgement:  Good  Insight:  Good  Psychomotor Activity:   Normal  Concentration:  Concentration: Good and Attention Span: Good  Recall:  Good  Fund of Knowledge: Good  Language: Good  Assets:  Desire for Improvement  ADL's:  Intact  Cognition: WNL  Prognosis:  Good   Endocrinology follows diabetes and hyperlipidemia.  DIAGNOSES:    ICD-10-CM   1. Bipolar I disorder ([place])  F31.9     2. GAD (generalized anxiety disorder)  F41.1     3. Alcoholism in recovery [facility])  F10.21      Receiving Psychotherapy: Yes   Peggy Hanes   RECOMMENDATIONS:  PDMP was reviewed.  Last Xanax filled 01/05/2022. Modafinil filled 07/22/2021.    Plans to start forest therapy.   Asks [employer]. Considering it  Continue Xanax 0.5 mg 1/2-1 twice daily as needed.  (She does not have access to the bottle.  Her wife gives her a dose when absolutely necessary.) Continue Lamictal 150 mg, 2 po qhs. Continue modafinil 200 mg, 1.5 pills daily. Continue Seroquel 400 mg nightly. Continue melatonin 10 mg nightly as needed sleep. Continue therapy. Continue AA meetings. Return in 6 months.    Donnal Moat, PA-C

## 2021-07-31 DIAGNOSIS — Z794 Long term (current) use of insulin: Secondary | ICD-10-CM | POA: Diagnosis not present

## 2021-07-31 DIAGNOSIS — E109 Type 1 diabetes mellitus without complications: Secondary | ICD-10-CM | POA: Diagnosis not present

## 2021-08-03 DIAGNOSIS — E109 Type 1 diabetes mellitus without complications: Secondary | ICD-10-CM | POA: Diagnosis not present

## 2021-08-03 DIAGNOSIS — Z794 Long term (current) use of insulin: Secondary | ICD-10-CM | POA: Diagnosis not present

## 2021-08-24 ENCOUNTER — Other Ambulatory Visit: Payer: Self-pay | Admitting: Emergency Medicine

## 2021-08-24 DIAGNOSIS — E1059 Type 1 diabetes mellitus with other circulatory complications: Secondary | ICD-10-CM

## 2021-09-07 ENCOUNTER — Other Ambulatory Visit: Payer: Self-pay | Admitting: Physician Assistant

## 2021-09-29 ENCOUNTER — Other Ambulatory Visit: Payer: Self-pay | Admitting: Emergency Medicine

## 2021-09-29 DIAGNOSIS — E1059 Type 1 diabetes mellitus with other circulatory complications: Secondary | ICD-10-CM

## 2021-10-02 ENCOUNTER — Other Ambulatory Visit: Payer: Self-pay | Admitting: Emergency Medicine

## 2021-10-29 ENCOUNTER — Other Ambulatory Visit: Payer: Self-pay | Admitting: Physician Assistant

## 2021-10-29 NOTE — Telephone Encounter (Signed)
Filled 01/05/21

## 2021-10-31 ENCOUNTER — Other Ambulatory Visit: Payer: Self-pay | Admitting: Emergency Medicine

## 2021-11-02 ENCOUNTER — Encounter: Payer: Self-pay | Admitting: Internal Medicine

## 2021-11-02 ENCOUNTER — Ambulatory Visit: Payer: BC Managed Care – PPO | Admitting: Internal Medicine

## 2021-11-02 VITALS — BP 122/70 | HR 81 | Ht 64.0 in | Wt 226.6 lb

## 2021-11-02 DIAGNOSIS — E7800 Pure hypercholesterolemia, unspecified: Secondary | ICD-10-CM

## 2021-11-02 DIAGNOSIS — E1010 Type 1 diabetes mellitus with ketoacidosis without coma: Secondary | ICD-10-CM

## 2021-11-02 DIAGNOSIS — R7989 Other specified abnormal findings of blood chemistry: Secondary | ICD-10-CM

## 2021-11-02 LAB — POCT GLYCOSYLATED HEMOGLOBIN (HGB A1C): Hemoglobin A1C: 7.2 % — AB (ref 4.0–5.6)

## 2021-11-02 NOTE — Patient Instructions (Addendum)
Please use the following pump settings: - basal rates: 12 am: 2.2 11 am: 1.95 >> 2.0 1 pm: 2.05 6 pm: 1.5 >> 1.7 - ICR:   12 am: 3.7  7 am: 3.5  11 am: 4.3  3:30 pm: 4.0  4:30 pm: 4.3  5 pm: 3.4 - target: 105-115 - ISF:  12 am: 20 11 am: 30 3 pm: 20 - Insulin on Board: 2:45 h - bolus wizard: on  Check with your insurance if the following pumps are covered: - t: slim X2 - Omnipod 5 (or the DASH)  Please return in 4 month.
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
## 2021-11-10 ENCOUNTER — Encounter: Payer: Self-pay | Admitting: Emergency Medicine

## 2021-11-10 ENCOUNTER — Ambulatory Visit: Payer: BC Managed Care – PPO | Admitting: Emergency Medicine

## 2021-11-10 VITALS — BP 138/72 | HR 86 | Temp 98.0°F | Ht 64.0 in | Wt 227.5 lb

## 2021-11-10 DIAGNOSIS — Z23 Encounter for immunization: Secondary | ICD-10-CM

## 2021-11-10 DIAGNOSIS — E1159 Type 2 diabetes mellitus with other circulatory complications: Secondary | ICD-10-CM | POA: Diagnosis not present

## 2021-11-10 DIAGNOSIS — I152 Hypertension secondary to endocrine disorders: Secondary | ICD-10-CM | POA: Diagnosis not present

## 2021-11-10 DIAGNOSIS — F418 Other specified anxiety disorders: Secondary | ICD-10-CM

## 2021-11-10 LAB — CBC WITH DIFFERENTIAL/PLATELET
Basophils Absolute: 0 10*3/uL (ref 0.0–0.1)
Basophils Relative: 0.5 % (ref 0.0–3.0)
Eosinophils Absolute: 0.6 10*3/uL (ref 0.0–0.7)
Eosinophils Relative: 7.4 % — ABNORMAL HIGH (ref 0.0–5.0)
HCT: 41 % (ref 36.0–46.0)
Hemoglobin: 14.1 g/dL (ref 12.0–15.0)
Lymphocytes Relative: 20.6 % (ref 12.0–46.0)
Lymphs Abs: 1.7 10*3/uL (ref 0.7–4.0)
MCHC: 34.4 g/dL (ref 30.0–36.0)
MCV: 87.4 fl (ref 78.0–100.0)
Monocytes Absolute: 0.6 10*3/uL (ref 0.1–1.0)
Monocytes Relative: 7.3 % (ref 3.0–12.0)
Neutro Abs: 5.4 10*3/uL (ref 1.4–7.7)
Neutrophils Relative %: 64.2 % (ref 43.0–77.0)
Platelets: 262 10*3/uL (ref 150.0–400.0)
RBC: 4.7 Mil/uL (ref 3.87–5.11)
RDW: 13.9 % (ref 11.5–15.5)
WBC: 8.4 10*3/uL (ref 4.0–10.5)
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
## 2021-11-10 NOTE — Patient Instructions (Signed)

## 2021-11-10 NOTE — Assessment & Plan Note (Signed)
Coping well but affecting quality of life. We will monitor.

## 2021-11-10 NOTE — Assessment & Plan Note (Signed)
Well-controlled hypertension.  Continue lisinopril 10 mg daily. BP Readings from Last 3 Encounters:  11/10/21 138/72  11/02/21 122/70  03/03/21 128/82    Well-controlled diabetes. Lab Results  Component Value Date   HGBA1C 7.2 (A) 11/02/2021  On insulin pump.  Follows up with endocrinologist on a regular basis. Diet and nutrition discussed. Current risk associated with hypertension and diabetes discussed.

## 2021-11-11 LAB — COMPREHENSIVE METABOLIC PANEL
ALT: 21 U/L (ref 0–35)
AST: 26 U/L (ref 0–37)
Albumin: 4.7 g/dL (ref 3.5–5.2)
Alkaline Phosphatase: 101 U/L (ref 39–117)
BUN: 12 mg/dL (ref 6–23)
CO2: 26 mEq/L (ref 19–32)
Calcium: 9.6 mg/dL (ref 8.4–10.5)
Chloride: 105 mEq/L (ref 96–112)
Creatinine, Ser: 0.8 mg/dL (ref 0.40–1.20)
GFR: 83.75 mL/min (ref >60.00)
Glucose, Bld: 105 mg/dL — ABNORMAL HIGH (ref 70–99)
Potassium: 4.3 mEq/L (ref 3.5–5.1)
Sodium: 140 mEq/L (ref 135–145)
Total Bilirubin: 0.3 mg/dL (ref 0.2–1.2)
Total Protein: 7.3 g/dL (ref 6.0–8.3)

## 2021-11-11 LAB — LIPID PANEL
Cholesterol: 142 mg/dL (ref 0–200)
HDL: 50.2 mg/dL (ref >39.00)
LDL Cholesterol: 71 mg/dL (ref 0–99)
NonHDL: 91.84
Total CHOL/HDL Ratio: 3
Triglycerides: 104 mg/dL (ref 0.0–149.0)
VLDL: 20.8 mg/dL (ref 0.0–40.0)

## 2021-11-11 LAB — HEMOGLOBIN A1C: Hgb A1c MFr Bld: 7.2 % — ABNORMAL HIGH (ref 4.6–6.5)

## 2021-11-28 ENCOUNTER — Other Ambulatory Visit: Payer: Self-pay | Admitting: Emergency Medicine

## 2021-12-15 ENCOUNTER — Telehealth: Payer: Self-pay

## 2021-12-15 DIAGNOSIS — Z794 Long term (current) use of insulin: Secondary | ICD-10-CM | POA: Diagnosis not present

## 2021-12-15 DIAGNOSIS — E109 Type 1 diabetes mellitus without complications: Secondary | ICD-10-CM | POA: Diagnosis not present

## 2021-12-15 NOTE — Telephone Encounter (Signed)
Inbound email from DME supplier requesting form be completed and faxed with clinical notes. DME supplies ordered via [employer] through online portal.  

## 2021-12-18 ENCOUNTER — Other Ambulatory Visit: Payer: Self-pay | Admitting: Emergency Medicine

## 2022-01-13 ENCOUNTER — Ambulatory Visit (INDEPENDENT_AMBULATORY_CARE_PROVIDER_SITE_OTHER): Payer: BC Managed Care – PPO | Admitting: Physician Assistant

## 2022-02-14 ENCOUNTER — Other Ambulatory Visit: Payer: Self-pay | Admitting: Physician Assistant

## 2022-02-15 NOTE — Telephone Encounter (Signed)
Last filled 12/11, due 1/9. Verified that she is still taking 200 mg 1-1/2 tabs.

## 2022-02-15 NOTE — Telephone Encounter (Signed)
LVM to [facility]. Is she still taking?

## 2022-02-18 DIAGNOSIS — G4733 Obstructive sleep apnea (adult) (pediatric): Secondary | ICD-10-CM | POA: Diagnosis not present

## 2022-02-19 ENCOUNTER — Ambulatory Visit: Payer: BC Managed Care – PPO | Admitting: Physician Assistant

## 2022-02-19 ENCOUNTER — Encounter: Payer: Self-pay | Admitting: Physician Assistant

## 2022-02-19 DIAGNOSIS — F319 Bipolar disorder, unspecified: Secondary | ICD-10-CM | POA: Diagnosis not present

## 2022-02-19 DIAGNOSIS — F900 Attention-deficit hyperactivity disorder, predominantly inattentive type: Secondary | ICD-10-CM

## 2022-02-19 DIAGNOSIS — G4700 Insomnia, unspecified: Secondary | ICD-10-CM

## 2022-02-19 DIAGNOSIS — F411 Generalized anxiety disorder: Secondary | ICD-10-CM | POA: Diagnosis not present

## 2022-02-19 DIAGNOSIS — F1021 Alcohol dependence, in remission: Secondary | ICD-10-CM

## 2022-02-19 MED ORDER — MODAFINIL 200 MG PO TABS: 300.0000 mg | ORAL_TABLET | Freq: Every day | ORAL | 5 refills | Status: DC

## 2022-02-19 MED ORDER — MIRTAZAPINE 7.5 MG PO TABS: 3.7500 mg | ORAL_TABLET | Freq: Every day | ORAL | 1 refills | Status: DC

## 2022-02-19 MED ORDER — QUETIAPINE FUMARATE 400 MG PO TABS: 400.0000 mg | ORAL_TABLET | Freq: Every day | ORAL | 1 refills | Status: DC

## 2022-02-19 NOTE — Progress Notes (Unsigned)
[facility]  Patient ID: Karla Rodriguez,  MRN: [id number]  PCP: Horald Pollen, MD  Date of Evaluation: 02/19/2022 Time spent:30 minutes  Chief Complaint:  Chief Complaint   Anxiety; Depression; Insomnia; Follow-up    HISTORY/CURRENT STATUS: For 84-monthmed check.    Has had trouble going to sleep, takes 1/2 xanax, works. Doesn't take qhs. Her wife is in control of giving her that. Pt has a remote h/o ETOH probs and doesn't want to do anything that might take her back to any addictive problems.   One of her step dtr has metastatic breast cancer, There's no treatment now except a trial. They're spending a lot of time with her, doing everything they can to make the most of the time they have left. That makes her sad, but at the same time, she's accepting the inevitable.   Patient is able to enjoy things.  Energy and motivation are good.  Work is going well.   No extreme sadness, tearfulness, or feelings of hopelessness. ADLs and personal hygiene are normal.   Denies any changes in concentration, making decisions, or remembering things.  Appetite has not changed.  Weight is stable.  Denies suicidal or homicidal thoughts.  Patient denies increased energy with decreased need for sleep, increased talkativeness, racing thoughts, impulsivity or risky behaviors, increased spending, increased libido, grandiosity, increased irritability or anger, paranoia, or hallucinations.  Denies dizziness, syncope, seizures, numbness, tingling, tremor, tics, unsteady gait, slurred speech, confusion. Denies muscle or joint pain, stiffness, or dystonia.  Her endocrinologist takes care of the known diabetes, and her cholesterol.  Individual Medical History/ Review of Systems: Changes? :No    Past medications for mental health diagnoses include: Xanax, Lexapro, Buspar, Rozerem, Saphris, Lamictal, Wellbutrin, Ambien, Nuvigil, Risperdal, Celexa, Seroquel, Latuda, Strattera, lithium,  Trazodone  Allergies: Patient has no known allergies.  Current Medications:  Current Outpatient Medications:    ALPRAZolam (XANAX) 0.5 MG tablet, TAKE 0.5-1 TABLETS (0.25-0.5 MG TOTAL) BY MOUTH 2 (TWO) TIMES DAILY AS NEEDED FOR ANXIETY., Disp: 30 tablet, Rfl: 0   Cholecalciferol (VITAMIN D PO), Take by mouth., Disp: , Rfl:    Glucagon 3 MG/DOSE POWD, Place 3 mg into the nose once as needed for up to 1 dose., Disp: 1 each, Rfl: 11   insulin lispro (HUMALOG) 100 UNIT/ML injection, VIA INSULIN PUMP - 150 UNITS PER DAY, Disp: 150 mL, Rfl: 3   lamoTRIgine (LAMICTAL) 150 MG tablet, TAKE 2 TABLETS AT BEDTIME (Patient taking differently: Take 150 mg by mouth at bedtime.), Disp: 180 tablet, Rfl: 1   lisinopril (ZESTRIL) 10 MG tablet, TAKE 1 TABLET BY MOUTH EVERY DAY, Disp: 90 tablet, Rfl: 0   meclizine (ANTIVERT) 25 MG tablet, Take 1 tablet (25 mg total) by mouth 3 (three) times daily as needed for dizziness., Disp: 30 tablet, Rfl: 0   metaxalone (SKELAXIN) 800 MG tablet, Take 1 tablet (800 mg total) by mouth 3 (three) times daily as needed for muscle spasms., Disp: 30 tablet, Rfl: 3   mirtazapine (REMERON) 7.5 MG tablet, Take 0.5-2 tablets (3.75-15 mg total) by mouth at bedtime., Disp: 60 tablet, Rfl: 1   pantoprazole (PROTONIX) 40 MG tablet, TAKE 1 TABLET DAILY (NEED OFFICE VISIT FOR REFILLS), Disp: 90 tablet, Rfl: 3   rosuvastatin (CRESTOR) 20 MG tablet, TAKE 1 TABLET BY MOUTH EVERYDAY AT BEDTIME, Disp: 90 tablet, Rfl: 1   TURMERIC PO, Take by mouth., Disp: , Rfl:    UNABLE TO FIND, [employer] Disp: , Rfl:  modafinil (PROVIGIL) 200 MG tablet, Take 1.5 tablets (300 mg total) by mouth daily., Disp: 45 tablet, Rfl: 5   QUEtiapine (SEROQUEL) 400 MG tablet, Take 1 tablet (400 mg total) by mouth at bedtime., Disp: 90 tablet, Rfl: 1 Medication Side Effects: none  Family Medical/ Social History: Changes?  Step-daughter,  44 yo,  has Stage IV breast cancer. See HPI  MENTAL HEALTH EXAM:  There were no  vitals taken for this visit.There is no height or weight on file to calculate BMI.  General Appearance: Casual, Neat, Well Groomed and Obese  Eye Contact:  Good  Speech:  Clear and Coherent and Normal Rate  Volume:  Normal  Mood:  Euthymic  Affect:  Congruent  Thought Process:  Goal Directed and Descriptions of Associations: Circumstantial  Orientation:  Full (Time, Place, and Person)  Thought Content: Logical   Suicidal Thoughts:  No  Homicidal Thoughts:  No  Memory:  WNL  Judgement:  Good  Insight:  Good  Psychomotor Activity:  Normal  Concentration:  Concentration: Good and Attention Span: Good  Recall:  Good  Fund of Knowledge: Good  Language: Good  Assets:  Communication Skills Desire for Improvement Financial Resources/Insurance Housing Transportation Vocational/Educational  ADL's:  Intact  Cognition: WNL  Prognosis:  Good   Endocrinology follows diabetes and hyperlipidemia.  Labs 11/10/2021 CBC nl CMP nl HgbA1C 7.2 Lipids completely nl   DIAGNOSES:    ICD-10-CM   1. Bipolar I disorder ([place])  F31.9     2. Alcoholism in recovery ([place])  F10.21     3. Attention deficit hyperactivity disorder (ADHD), predominantly inattentive type  F90.0     4. GAD (generalized anxiety disorder)  F41.1     5. Insomnia, unspecified type  G47.00       Receiving Psychotherapy: Yes   Peggy Hanes  RECOMMENDATIONS:  PDMP was reviewed.  Last Xanax filled 10/29/2021.  Modafinil filled 02/16/2022. I provided 30 minutes of face to face time during this encounter, including time spent before and after the visit in records review, medical decision making, counseling pertinent to today's visit, and charting.   She's doing well as far as her mental health meds are concerned but I do recommend adding something for sleep. Disc Mirtazapine, benefits, risks, SE were discussed. She's not sure she'll need it but I'll send in Rx anyway so can be on file.   Continue Xanax 0.5 mg 1/2-1 twice  daily as needed.  (She does not have access to the bottle.  Her wife gives her a dose when absolutely necessary.) Continue Lamictal 150 mg, 1 qd. Start Mirtazapine 7.5 mg, 1/2-2 pills qhs prn.  Continue modafinil 200 mg, 1.5 pills daily. Continue Seroquel 400 mg nightly. Continue melatonin 10 mg nightly as needed sleep. Continue therapy. Continue AA meetings. Return in 6 months.    Donnal Moat, PA-C

## 2022-03-04 ENCOUNTER — Other Ambulatory Visit: Payer: Self-pay | Admitting: Emergency Medicine

## 2022-03-04 DIAGNOSIS — I152 Hypertension secondary to endocrine disorders: Secondary | ICD-10-CM

## 2022-03-08 ENCOUNTER — Ambulatory Visit: Payer: BC Managed Care – PPO | Admitting: Internal Medicine

## 2022-03-08 ENCOUNTER — Other Ambulatory Visit: Payer: Self-pay | Admitting: Physician Assistant
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
## 2022-03-13 ENCOUNTER — Other Ambulatory Visit: Payer: Self-pay | Admitting: Physician Assistant

## 2022-04-16 DIAGNOSIS — Z794 Long term (current) use of insulin: Secondary | ICD-10-CM | POA: Diagnosis not present

## 2022-04-16 DIAGNOSIS — E109 Type 1 diabetes mellitus without complications: Secondary | ICD-10-CM | POA: Diagnosis not present

## 2022-05-17 ENCOUNTER — Other Ambulatory Visit: Payer: Self-pay | Admitting: Internal Medicine

## 2022-05-17 DIAGNOSIS — E1010 Type 1 diabetes mellitus with ketoacidosis without coma: Secondary | ICD-10-CM

## 2022-05-18 ENCOUNTER — Ambulatory Visit: Payer: BC Managed Care – PPO | Admitting: Emergency Medicine

## 2022-06-02 ENCOUNTER — Other Ambulatory Visit: Payer: Self-pay | Admitting: Emergency Medicine

## 2022-06-02 DIAGNOSIS — E1059 Type 1 diabetes mellitus with other circulatory complications: Secondary | ICD-10-CM

## 2022-07-12 DIAGNOSIS — Z794 Long term (current) use of insulin: Secondary | ICD-10-CM | POA: Diagnosis not present

## 2022-07-12 DIAGNOSIS — E109 Type 1 diabetes mellitus without complications: Secondary | ICD-10-CM | POA: Diagnosis not present

## 2022-07-22 DIAGNOSIS — E109 Type 1 diabetes mellitus without complications: Secondary | ICD-10-CM | POA: Diagnosis not present

## 2022-08-20 ENCOUNTER — Other Ambulatory Visit: Payer: Self-pay | Admitting: Physician Assistant

## 2022-08-23 ENCOUNTER — Ambulatory Visit (INDEPENDENT_AMBULATORY_CARE_PROVIDER_SITE_OTHER): Payer: BC Managed Care – PPO | Admitting: Physician Assistant

## 2022-08-23 ENCOUNTER — Encounter: Payer: Self-pay | Admitting: Physician Assistant

## 2022-08-23 ENCOUNTER — Other Ambulatory Visit: Payer: Self-pay | Admitting: Physician Assistant

## 2022-08-23 DIAGNOSIS — F1021 Alcohol dependence, in remission: Secondary | ICD-10-CM | POA: Diagnosis not present

## 2022-08-23 DIAGNOSIS — F319 Bipolar disorder, unspecified: Secondary | ICD-10-CM | POA: Diagnosis not present

## 2022-08-23 DIAGNOSIS — G4700 Insomnia, unspecified: Secondary | ICD-10-CM | POA: Diagnosis not present

## 2022-08-23 DIAGNOSIS — F411 Generalized anxiety disorder: Secondary | ICD-10-CM | POA: Diagnosis not present

## 2022-08-23 MED ORDER — LAMOTRIGINE 150 MG PO TABS: 300.0000 mg | ORAL_TABLET | Freq: Every day | ORAL | 1 refills | Status: DC

## 2022-08-23 MED ORDER — MIRTAZAPINE 7.5 MG PO TABS: 3.7500 mg | ORAL_TABLET | Freq: Every day | ORAL | 1 refills | Status: DC

## 2022-08-23 MED ORDER — QUETIAPINE FUMARATE 400 MG PO TABS: 400.0000 mg | ORAL_TABLET | Freq: Every day | ORAL | 1 refills | Status: DC

## 2022-08-23 MED ORDER — ALPRAZOLAM 0.5 MG PO TABS: 0.2500 mg | ORAL_TABLET | Freq: Two times a day (BID) | ORAL | 0 refills | Status: AC | PRN

## 2022-08-23 NOTE — Progress Notes (Signed)
[facility] Med Check  Patient ID: Karla Rodriguez,  MRN: [id number]  PCP: Georgina Quint, MD  Date of Evaluation: 08/23/2022 Time spent:20 minutes  Chief Complaint:  Chief Complaint   Anxiety; Depression; Insomnia; Follow-up    HISTORY/CURRENT STATUS: For 41-month med check.    Her step-daughter died in 05/17/2022 from breast cancer. That's been sad. She and her wife are handling it ok.  She feels like the worst part of the grief is over with.  She has been down of course but feels that it is to be expected and she does not stay down.  She is able to work.  Energy and motivation are good most of the time.  Appetite is good and weight is stable.  ADLs and personal hygiene are normal.  She sleeps well.  Mirtazapine helps.  Denies suicidal or homicidal thoughts.  She does get anxious at times and occasionally takes the Xanax.  States she does not like to take anything that alters her mind.  She has been attempted a few times to drink alcohol but not often and she has not given in.  She is not craving it.  Patient denies increased energy with decreased need for sleep, increased talkativeness, racing thoughts, impulsivity or risky behaviors, increased spending, increased libido, grandiosity, increased irritability or anger, paranoia, or hallucinations.  Denies dizziness, syncope, seizures, numbness, tingling, tremor, tics, unsteady gait, slurred speech, confusion. Denies muscle or joint pain, stiffness, or dystonia.  Her endocrinologist takes care of the known diabetes, and her cholesterol.  Individual Medical History/ Review of Systems: Changes? :No    Past medications for mental health diagnoses include: Xanax, Lexapro, Buspar, Rozerem, Saphris, Lamictal, Wellbutrin, Ambien, Nuvigil, Risperdal, Celexa, Seroquel, Latuda, Strattera, lithium, Trazodone  Allergies: Patient has no known allergies.  Current Medications:  Current Outpatient Medications:    Cholecalciferol (VITAMIN D PO),  Take by mouth., Disp: , Rfl:    Glucagon 3 MG/DOSE POWD, Place 3 mg into the nose once as needed for up to 1 dose., Disp: 1 each, Rfl: 11   insulin lispro (HUMALOG) 100 UNIT/ML injection, VIA INSULIN PUMP - 150 UNITS PER DAY, Disp: 150 mL, Rfl: 3   Levomefolic Acid (5-MTHF PO), Take by mouth., Disp: , Rfl:    lisinopril (ZESTRIL) 10 MG tablet, TAKE 1 TABLET BY MOUTH EVERY DAY, Disp: 90 tablet, Rfl: 0   meclizine (ANTIVERT) 25 MG tablet, Take 1 tablet (25 mg total) by mouth 3 (three) times daily as needed for dizziness., Disp: 30 tablet, Rfl: 0   metaxalone (SKELAXIN) 800 MG tablet, Take 1 tablet (800 mg total) by mouth 3 (three) times daily as needed for muscle spasms., Disp: 30 tablet, Rfl: 3   pantoprazole (PROTONIX) 40 MG tablet, TAKE 1 TABLET DAILY (NEED OFFICE VISIT FOR REFILLS), Disp: 90 tablet, Rfl: 3   rosuvastatin (CRESTOR) 20 MG tablet, TAKE 1 TABLET BY MOUTH EVERYDAY AT BEDTIME, Disp: 90 tablet, Rfl: 1   TURMERIC PO, Take by mouth., Disp: , Rfl:    UNABLE TO FIND, [place], Disp: , Rfl:    ALPRAZolam (XANAX) 0.5 MG tablet, Take 0.5-1 tablets (0.25-0.5 mg total) by mouth 2 (two) times daily as needed for anxiety., Disp: 30 tablet, Rfl: 0   lamoTRIgine (LAMICTAL) 150 MG tablet, Take 2 tablets (300 mg total) by mouth at bedtime., Disp: 180 tablet, Rfl: 1   mirtazapine (REMERON) 7.5 MG tablet, Take 0.5-2 tablets (3.75-15 mg total) by mouth at bedtime., Disp: 180 tablet, Rfl: 1   modafinil (PROVIGIL)  200 MG tablet, TAKE 1 & 1/2 (ONE & ONE-HALF) TABLETS BY MOUTH ONCE DAILY, Disp: 45 tablet, Rfl: 5   QUEtiapine (SEROQUEL) 400 MG tablet, Take 1 tablet (400 mg total) by mouth at bedtime., Disp: 90 tablet, Rfl: 1 Medication Side Effects: none  Family Medical/ Social History: Changes?  Step-daughter,  21 yo,  has Stage IV breast cancer. See HPI  MENTAL HEALTH EXAM:  There were no vitals taken for this visit.There is no height or weight on file to calculate BMI.  General Appearance: Casual,  Neat, Well Groomed and Obese  Eye Contact:  Good  Speech:  Clear and Coherent and Normal Rate  Volume:  Normal  Mood:   Sad when talking about the loss of her daughter.  Affect:  Congruent  Thought Process:  Goal Directed and Descriptions of Associations: Circumstantial  Orientation:  Full (Time, Place, and Person)  Thought Content: Logical   Suicidal Thoughts:  No  Homicidal Thoughts:  No  Memory:  WNL  Judgement:  Good  Insight:  Good  Psychomotor Activity:  Normal  Concentration:  Concentration: Good and Attention Span: Good  Recall:  Good  Fund of Knowledge: Good  Language: Good  Assets:  Communication Skills Desire for Improvement Financial Resources/Insurance Housing Transportation Vocational/Educational  ADL's:  Intact  Cognition: WNL  Prognosis:  Good   Endocrinology follows diabetes and hyperlipidemia.  DIAGNOSES:    ICD-10-CM   1. Bipolar I disorder (HCC)  F31.9     2. GAD (generalized anxiety disorder)  F41.1     3. Insomnia, unspecified type  G47.00     4. Alcoholism in recovery [facility])  F10.21       Receiving Psychotherapy: Yes   Peggy Hanes  RECOMMENDATIONS:  PDMP was reviewed.  Last modafinil filled 07/14/2022.  Last Xanax filled 10/29/2021. I provided 20 minutes of face to face time during this encounter, including time spent before and after the visit in records review, medical decision making, counseling pertinent to today's visit, and charting.   My condolences in the loss of her daughter.  Under the circumstances she seems to be doing well so no med changes are needed.  If she starts craving alcohol then call and I will start naltrexone or acamprosate.  Continue Xanax 0.5 mg 1/2-1 twice daily as needed.  (She does not have access to the bottle.  Her wife gives her a dose when absolutely necessary.) Continue Lamictal 150 mg, 1 qd. Continue mirtazapine 7.5 mg, 1/2-2 pills qhs prn.  Continue modafinil 200 mg, 1.5 pills daily. Continue Seroquel 400 mg  nightly. Continue melatonin 10 mg nightly as needed sleep. Continue therapy. Continue AA meetings. Return in 6 months.    Melony Overly, PA-C

## 2022-08-27 ENCOUNTER — Other Ambulatory Visit: Payer: Self-pay | Admitting: Internal Medicine

## 2022-08-27 DIAGNOSIS — E1010 Type 1 diabetes mellitus with ketoacidosis without coma: Secondary | ICD-10-CM

## 2022-09-02 DIAGNOSIS — G4733 Obstructive sleep apnea (adult) (pediatric): Secondary | ICD-10-CM | POA: Diagnosis not present

## 2022-09-03 ENCOUNTER — Other Ambulatory Visit: Payer: Self-pay | Admitting: Emergency Medicine

## 2022-09-03 DIAGNOSIS — E1059 Type 1 diabetes mellitus with other circulatory complications: Secondary | ICD-10-CM

## 2022-09-16 ENCOUNTER — Ambulatory Visit: Payer: BC Managed Care – PPO | Admitting: Emergency Medicine

## 2022-09-20 ENCOUNTER — Ambulatory Visit: Payer: BC Managed Care – PPO | Admitting: Emergency Medicine

## 2022-09-20 ENCOUNTER — Encounter: Payer: Self-pay | Admitting: Emergency Medicine

## 2022-09-20 VITALS — BP 132/86 | HR 86 | Temp 98.7°F | Ht 64.0 in | Wt 231.4 lb

## 2022-09-20 DIAGNOSIS — Z23 Encounter for immunization: Secondary | ICD-10-CM | POA: Diagnosis not present

## 2022-09-20 DIAGNOSIS — I152 Hypertension secondary to endocrine disorders: Secondary | ICD-10-CM

## 2022-09-20 DIAGNOSIS — E7800 Pure hypercholesterolemia, unspecified: Secondary | ICD-10-CM

## 2022-09-20 DIAGNOSIS — Z794 Long term (current) use of insulin: Secondary | ICD-10-CM

## 2022-09-20 DIAGNOSIS — Z1231 Encounter for screening mammogram for malignant neoplasm of breast: Secondary | ICD-10-CM

## 2022-09-20 DIAGNOSIS — K219 Gastro-esophageal reflux disease without esophagitis: Secondary | ICD-10-CM

## 2022-09-20 DIAGNOSIS — F418 Other specified anxiety disorders: Secondary | ICD-10-CM

## 2022-09-20 DIAGNOSIS — E1159 Type 2 diabetes mellitus with other circulatory complications: Secondary | ICD-10-CM | POA: Diagnosis not present

## 2022-09-20 DIAGNOSIS — F3175 Bipolar disorder, in partial remission, most recent episode depressed: Secondary | ICD-10-CM

## 2022-09-20 NOTE — Assessment & Plan Note (Signed)
Chronic stable condition Continues rosuvastatin 20 mg daily The 10-year ASCVD risk score (Arnett DK, et al., 2019) is: 3.7%   Values used to calculate the score:     Age: 57 years     Sex: Female     Is Non-Hispanic African American: No     Diabetic: Yes     Tobacco smoker: No     Systolic Blood Pressure: 132 mmHg     Is BP treated: Yes     HDL Cholesterol: 50.2 mg/dL     Total Cholesterol: 142 mg/dL

## 2022-09-20 NOTE — Assessment & Plan Note (Signed)
Stable Continue Seroquel 400 mg at bedtime

## 2022-09-20 NOTE — Assessment & Plan Note (Signed)
Presently grieving recent loss of daughter

## 2022-09-20 NOTE — Assessment & Plan Note (Addendum)
BP Readings from Last 3 Encounters:  09/20/22 132/86  11/10/21 138/72  11/02/21 122/70  Well-controlled hypertension Continue lisinopril 10 mg daily Well-controlled diabetes.  On insulin pump.  Follows up with endocrinologist.  Medications handled by their office. Cardiovascular risks associated with hypertension and diabetes discussed Continues rosuvastatin 20 mg daily
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
## 2022-09-20 NOTE — Patient Instructions (Signed)

## 2022-09-20 NOTE — Assessment & Plan Note (Signed)
Well-controlled and asymptomatic. Continues Protonix 40 mg daily

## 2022-09-30 DIAGNOSIS — Z794 Long term (current) use of insulin: Secondary | ICD-10-CM | POA: Diagnosis not present

## 2022-09-30 DIAGNOSIS — E109 Type 1 diabetes mellitus without complications: Secondary | ICD-10-CM | POA: Diagnosis not present

## 2022-10-20 ENCOUNTER — Other Ambulatory Visit: Payer: Self-pay | Admitting: Physician Assistant

## 2022-10-21 NOTE — Telephone Encounter (Signed)
Verify dose. 7/15 note:  Continue Lamictal 150 mg, 1 qd.

## 2022-10-22 ENCOUNTER — Ambulatory Visit: Payer: BC Managed Care – PPO | Admitting: Internal Medicine

## 2022-10-22 ENCOUNTER — Encounter: Payer: Self-pay | Admitting: Internal Medicine

## 2022-10-22 VITALS — BP 128/80 | HR 87 | Ht 64.0 in | Wt 226.6 lb

## 2022-10-22 DIAGNOSIS — E1010 Type 1 diabetes mellitus with ketoacidosis without coma: Secondary | ICD-10-CM

## 2022-10-22 DIAGNOSIS — Z6836 Body mass index (BMI) 36.0-36.9, adult: Secondary | ICD-10-CM

## 2022-10-22 DIAGNOSIS — E7800 Pure hypercholesterolemia, unspecified: Secondary | ICD-10-CM | POA: Diagnosis not present

## 2022-10-22 DIAGNOSIS — R7989 Other specified abnormal findings of blood chemistry: Secondary | ICD-10-CM | POA: Diagnosis not present

## 2022-10-22 DIAGNOSIS — Z794 Long term (current) use of insulin: Secondary | ICD-10-CM

## 2022-10-22 LAB — HEMOGLOBIN A1C: Hemoglobin A1C: 7.2

## 2022-10-22 MED ORDER — INSULIN LISPRO 100 UNIT/ML IJ SOLN: INTRAMUSCULAR | 3 refills | Status: DC

## 2022-10-22 MED ORDER — GLUCAGON 3 MG/DOSE NA POWD: 3.0000 mg | Freq: Once | NASAL | 11 refills | Status: AC | PRN

## 2022-10-22 MED ORDER — METFORMIN HCL 1000 MG PO TABS: 1000.0000 mg | ORAL_TABLET | Freq: Every day | ORAL | 3 refills | Status: AC
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
## 2022-10-22 NOTE — Patient Instructions (Addendum)
Please continue: - basal rates: 12 am: 2.35 11 am: 2.25 1 pm: 2.40 3 pm: 1.7 >> 1.9 - ICR:   12 am: 3.7  7 am: 3.5  11 am: 4.3  3:30 pm: 4.0  4:30 pm: 4.3  5 pm: 3.4 - target: 105-115 - ISF:  12 am: 20 11 am: 30 3 pm: 20 - Insulin on Board: 2:45 h - bolus wizard: on  For a low carb meal, try to enter 40-50% of the protein amount (in grams) as carbs.  Try to restart Metformin 1000 mg daily with dinner.  Please return in 4 months.

## 2022-10-23 LAB — COMPREHENSIVE METABOLIC PANEL
AG Ratio: 2 (calc) (ref 1.0–2.5)
ALT: 16 U/L (ref 6–29)
AST: 14 U/L (ref 10–35)
Albumin: 4.5 g/dL (ref 3.6–5.1)
Alkaline phosphatase (APISO): 86 U/L (ref 37–153)
BUN: 15 mg/dL (ref 7–25)
CO2: 24 mmol/L (ref 20–32)
Calcium: 9.4 mg/dL (ref 8.6–10.4)
Chloride: 101 mmol/L (ref 98–110)
Creat: 0.71 mg/dL (ref 0.50–1.03)
Globulin: 2.2 g/dL (ref 1.9–3.7)
Glucose, Bld: 220 mg/dL — ABNORMAL HIGH (ref 65–99)
Potassium: 5.1 mmol/L (ref 3.5–5.3)
Sodium: 138 mmol/L (ref 135–146)
Total Bilirubin: 0.4 mg/dL (ref 0.2–1.2)
Total Protein: 6.7 g/dL (ref 6.1–8.1)

## 2022-10-23 LAB — MICROALBUMIN / CREATININE URINE RATIO
Creatinine, Urine: 59 mg/dL (ref 20–275)
Microalb Creat Ratio: 5 mg/g{creat} (ref <30)
Microalb, Ur: 0.3 mg/dL

## 2022-10-23 LAB — LIPID PANEL
Cholesterol: 138 mg/dL (ref <200)
HDL: 50 mg/dL (ref >=50)
LDL Cholesterol (Calc): 67 mg/dL
Non-HDL Cholesterol (Calc): 88 mg/dL (ref <130)
Total CHOL/HDL Ratio: 2.8 (calc) (ref <5.0)
Triglycerides: 131 mg/dL (ref <150)

## 2022-10-23 LAB — TSH: TSH: 1.82 m[IU]/L

## 2022-10-25 ENCOUNTER — Encounter: Payer: Self-pay | Admitting: Internal Medicine

## 2022-10-26 DIAGNOSIS — E109 Type 1 diabetes mellitus without complications: Secondary | ICD-10-CM | POA: Diagnosis not present

## 2022-10-28 NOTE — Telephone Encounter (Signed)
LVM to [facility] 

## 2022-11-03 ENCOUNTER — Encounter: Payer: Self-pay | Admitting: Internal Medicine

## 2022-12-03 DIAGNOSIS — G4733 Obstructive sleep apnea (adult) (pediatric): Secondary | ICD-10-CM | POA: Diagnosis not present

## 2022-12-06 ENCOUNTER — Other Ambulatory Visit: Payer: Self-pay | Admitting: Emergency Medicine

## 2022-12-13 ENCOUNTER — Other Ambulatory Visit: Payer: Self-pay | Admitting: Emergency Medicine

## 2022-12-22 ENCOUNTER — Other Ambulatory Visit: Payer: Self-pay | Admitting: Physician Assistant

## 2022-12-23 ENCOUNTER — Encounter: Payer: Self-pay | Admitting: Emergency Medicine

## 2022-12-26 ENCOUNTER — Other Ambulatory Visit: Payer: Self-pay | Admitting: Emergency Medicine

## 2022-12-26 MED ORDER — IBUPROFEN 800 MG PO TABS: 800.0000 mg | ORAL_TABLET | Freq: Three times a day (TID) | ORAL | 0 refills | Status: AC | PRN

## 2022-12-26 NOTE — Telephone Encounter (Signed)
New prescription for ibuprofen 800 mg sent to pharmacist today. Thanks.

## 2022-12-28 ENCOUNTER — Other Ambulatory Visit: Payer: Self-pay | Admitting: Emergency Medicine

## 2022-12-28 DIAGNOSIS — M549 Dorsalgia, unspecified: Secondary | ICD-10-CM

## 2022-12-28 MED ORDER — METAXALONE 800 MG PO TABS: 800.0000 mg | ORAL_TABLET | Freq: Three times a day (TID) | ORAL | 3 refills | Status: AC | PRN

## 2022-12-28 NOTE — Telephone Encounter (Signed)
New prescription for metaxalone 800 mg sent to pharmacy of record today.  Thanks.

## 2023-01-19 DIAGNOSIS — E109 Type 1 diabetes mellitus without complications: Secondary | ICD-10-CM | POA: Diagnosis not present

## 2023-02-15 ENCOUNTER — Other Ambulatory Visit: Payer: Self-pay | Admitting: Physician Assistant

## 2023-02-21 ENCOUNTER — Encounter: Payer: Self-pay | Admitting: Internal Medicine

## 2023-02-21 ENCOUNTER — Ambulatory Visit: Payer: BC Managed Care – PPO | Admitting: Internal Medicine

## 2023-02-21 VITALS — BP 120/70 | HR 91 | Ht 64.0 in | Wt 222.4 lb

## 2023-02-21 DIAGNOSIS — R7989 Other specified abnormal findings of blood chemistry: Secondary | ICD-10-CM

## 2023-02-21 DIAGNOSIS — E7800 Pure hypercholesterolemia, unspecified: Secondary | ICD-10-CM

## 2023-02-21 DIAGNOSIS — E1010 Type 1 diabetes mellitus with ketoacidosis without coma: Secondary | ICD-10-CM

## 2023-02-21 LAB — POCT GLYCOSYLATED HEMOGLOBIN (HGB A1C): Hemoglobin A1C: 7.5 % — AB (ref 4.0–5.6)

## 2023-02-21 NOTE — Patient Instructions (Addendum)
 Please the following pump settings: - basal rates: 12 am: 2.35 11 am: 2.25 1 pm: 2.40 3 pm: 1.9 - ICR:   12 am: 3.7  7 am: 3.5  11 am: 4.3  3:30 pm: 4.0  4:30 pm: 4.3   5 pm: 3.4 - target: 105-115 - ISF:  12 am: 20 11 am: 30 3 pm: 20 - Insulin  on Board: 2:45 h - bolus wizard: on For a low carb meal, try to enter 40-50% of the protein amount (in grams) as carbs.  Restart: - Metformin  1000 mg daily with dinner  Please return in 4 months.
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
## 2023-02-23 ENCOUNTER — Ambulatory Visit: Payer: BC Managed Care – PPO | Admitting: Physician Assistant

## 2023-02-23 ENCOUNTER — Encounter: Payer: Self-pay | Admitting: Physician Assistant

## 2023-02-23 DIAGNOSIS — F900 Attention-deficit hyperactivity disorder, predominantly inattentive type: Secondary | ICD-10-CM | POA: Diagnosis not present

## 2023-02-23 DIAGNOSIS — F319 Bipolar disorder, unspecified: Secondary | ICD-10-CM | POA: Diagnosis not present

## 2023-02-23 DIAGNOSIS — F411 Generalized anxiety disorder: Secondary | ICD-10-CM

## 2023-02-23 DIAGNOSIS — G4700 Insomnia, unspecified: Secondary | ICD-10-CM

## 2023-02-23 DIAGNOSIS — F1021 Alcohol dependence, in remission: Secondary | ICD-10-CM

## 2023-02-23 MED ORDER — QUETIAPINE FUMARATE 400 MG PO TABS: 400.0000 mg | ORAL_TABLET | Freq: Every day | ORAL | 1 refills | Status: DC

## 2023-02-23 MED ORDER — MODAFINIL 200 MG PO TABS: 300.0000 mg | ORAL_TABLET | Freq: Every day | ORAL | 5 refills | Status: DC

## 2023-02-23 MED ORDER — LAMOTRIGINE 150 MG PO TABS: 150.0000 mg | ORAL_TABLET | Freq: Every day | ORAL | 1 refills | Status: DC

## 2023-02-23 NOTE — Progress Notes (Signed)
 [facility] Check  Patient ID: Karla Rodriguez,  MRN: [id number]  PCP: Elvira Hammersmith, MD  Date of Evaluation: 02/23/2023 Time spent:20 minutes  Chief Complaint:  Chief Complaint   Anxiety; Depression; Follow-up    HISTORY/CURRENT STATUS: For 75-month med check.    Doing well. Is considering decreasing the Seroquel  at some point. Unsure if she needs this dose now.  We have discussed this in the past but due to circumstances at the time we did not make the change.  She is not ready to do it right now, maybe in a few months and she has 300 mg and would like to drop to that if it is appropriate.  Patient is able to enjoy things.  Energy and motivation are good.  Work is going well.  Got a promotion recently.  No extreme sadness, tearfulness, or feelings of hopelessness.  Sleeps well most of the time. ADLs and personal hygiene are normal.   Denies any changes in concentration, making decisions, or remembering things.  The modafinil  has really helped.  Appetite has not changed.  Weight is stable.  Does not get anxious very often but when she does she takes Xanax .  It is pretty rare.  Denies suicidal or homicidal thoughts.  Patient denies increased energy with decreased need for sleep, increased talkativeness, racing thoughts, impulsivity or risky behaviors, increased spending, increased libido, grandiosity, increased irritability or anger, paranoia, or hallucinations.  Denies dizziness, syncope, seizures, numbness, tingling, tremor, tics, unsteady gait, slurred speech, confusion. Denies muscle or joint pain, stiffness, or dystonia.  Her endocrinologist takes care of the known diabetes, and her cholesterol.  Individual Medical History/ Review of Systems: Changes? :No    Past medications for mental health diagnoses include: Xanax , Lexapro , Buspar, Rozerem , Saphris , Lamictal , Wellbutrin, Ambien, Nuvigil, Risperdal, Celexa, Seroquel , Latuda, Strattera, lithium, Trazodone  Allergies:  Patient has no known allergies.  Current Medications:  Current Outpatient Medications:    ALPRAZolam  (XANAX ) 0.5 MG tablet, Take 0.5-1 tablets (0.25-0.5 mg total) by mouth 2 (two) times daily as needed for anxiety., Disp: 30 tablet, Rfl: 0   Cholecalciferol (VITAMIN D  PO), Take by mouth., Disp: , Rfl:    Glucagon  3 MG/DOSE POWD, Place 3 mg into the nose once as needed for up to 1 dose., Disp: 1 each, Rfl: 11   ibuprofen  (ADVIL ) 800 MG tablet, Take 1 tablet (800 mg total) by mouth every 8 (eight) hours as needed., Disp: 15 tablet, Rfl: 0   insulin  lispro (HUMALOG ) 100 UNIT/ML injection, VIA INSULIN  PUMP - 150 UNITS PER DAY, Disp: 140 mL, Rfl: 3   Levomefolic Acid (5-MTHF PO), Take by mouth., Disp: , Rfl:    lisinopril  (ZESTRIL ) 10 MG tablet, TAKE 1 TABLET BY MOUTH EVERY DAY, Disp: 90 tablet, Rfl: 3   meclizine  (ANTIVERT ) 25 MG tablet, Take 1 tablet (25 mg total) by mouth 3 (three) times daily as needed for dizziness., Disp: 30 tablet, Rfl: 0   metaxalone  (SKELAXIN ) 800 MG tablet, Take 1 tablet (800 mg total) by mouth 3 (three) times daily as needed for muscle spasms., Disp: 30 tablet, Rfl: 3   pantoprazole  (PROTONIX ) 40 MG tablet, TAKE 1 TABLET DAILY (NEED OFFICE VISIT FOR REFILLS), Disp: 90 tablet, Rfl: 3   rosuvastatin  (CRESTOR ) 20 MG tablet, TAKE 1 TABLET BY MOUTH EVERYDAY AT BEDTIME, Disp: 90 tablet, Rfl: 1   TURMERIC PO, Take by mouth., Disp: , Rfl:    UNABLE TO FIND, [place], Disp: , Rfl:    lamoTRIgine  (LAMICTAL ) 150  MG tablet, Take 1 tablet (150 mg total) by mouth at bedtime., Disp: 90 tablet, Rfl: 1   metFORMIN  (GLUCOPHAGE ) 1000 MG tablet, Take 1 tablet (1,000 mg total) by mouth daily with supper. (Patient not taking: Reported on 02/23/2023), Disp: 90 tablet, Rfl: 3   mirtazapine  (REMERON ) 7.5 MG tablet, Take 0.5-2 tablets (3.75-15 mg total) by mouth at bedtime. (Patient not taking: Reported on 02/23/2023), Disp: 180 tablet, Rfl: 1   modafinil  (PROVIGIL ) 200 MG tablet, Take 1.5 tablets  (300 mg total) by mouth daily., Disp: 45 tablet, Rfl: 5   QUEtiapine  (SEROQUEL ) 400 MG tablet, Take 1 tablet (400 mg total) by mouth at bedtime., Disp: 90 tablet, Rfl: 1 Medication Side Effects: none  Family Medical/ Social History: Changes?  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   MENTAL HEALTH EXAM:  There were no vitals taken for this visit.There is no height or weight on file to calculate BMI.  General Appearance: Casual, Neat, Well Groomed and Obese  Eye Contact:  Good  Speech:  Clear and Coherent and Normal Rate  Volume:  Normal  Mood:  Euthymic  Affect:  Congruent  Thought Process:  Goal Directed and Descriptions of Associations: Circumstantial  Orientation:  Full (Time, Place, and Person)  Thought Content: Logical   Suicidal Thoughts:  No  Homicidal Thoughts:  No  Memory:  WNL  Judgement:  Good  Insight:  Good  Psychomotor Activity:  Normal  Concentration:  Concentration: Good and Attention Span: Good  Recall:  Good  Fund of Knowledge: Good  Language: Good  Assets:  Communication Skills Desire for Improvement Financial Resources/Insurance Housing Resilience Transportation Vocational/Educational  ADL's:  Intact  Cognition: WNL  Prognosis:  Good   Endocrinology follows diabetes and hyperlipidemia.  DIAGNOSES:    ICD-10-CM   1. Bipolar I disorder (HCC)  F31.9     2. Insomnia, unspecified type  G47.00     3. GAD (generalized anxiety disorder)  F41.1     4. Attention deficit hyperactivity disorder (ADHD), predominantly inattentive type  F90.0     5. Alcoholism in recovery [facility])  F10.21       Receiving Psychotherapy: Yes   Peggy Hanes  RECOMMENDATIONS:  PDMP was reviewed.  Last modafinil  filled 02/16/2023.  Xanax  filled 08/23/2022. I provided 20 minutes of face to face time during this encounter, including time spent before and after the visit in records review, medical  decision making, counseling pertinent to today's visit, and charting.   We discussed decreasing the Seroquel  dose.  It is fine if she wants to drop to 300 mg at any point.  She can call and we will send in a prescription for that.  She has plenty on hand for now.  Overall she is doing well so no changes need to be made.  Continue Xanax  0.5 mg 1/2-1 twice daily as needed.  (She does not have access to the bottle.  Her wife gives her a dose when absolutely necessary.) Continue Lamictal  150 mg, 1 qd. Continue mirtazapine  7.5 mg, 1/2-2 pills qhs prn.  Continue modafinil  200 mg, 1.5 pills daily. Continue Seroquel  400 mg nightly. Continue melatonin 10 mg nightly as needed sleep. Continue therapy. Continue AA meetings. Return in 6 months.    Marvia Slocumb, PA-C

## 2023-03-06 DIAGNOSIS — G4733 Obstructive sleep apnea (adult) (pediatric): Secondary | ICD-10-CM | POA: Diagnosis not present

## 2023-03-10 DIAGNOSIS — E109 Type 1 diabetes mellitus without complications: Secondary | ICD-10-CM | POA: Diagnosis not present

## 2023-03-10 DIAGNOSIS — Z794 Long term (current) use of insulin: Secondary | ICD-10-CM | POA: Diagnosis not present

## 2023-04-15 DIAGNOSIS — E109 Type 1 diabetes mellitus without complications: Secondary | ICD-10-CM | POA: Diagnosis not present

## 2023-04-18 DIAGNOSIS — E109 Type 1 diabetes mellitus without complications: Secondary | ICD-10-CM | POA: Diagnosis not present

## 2023-04-18 LAB — HM DIABETES EYE EXAM

## 2023-06-17 DIAGNOSIS — Z794 Long term (current) use of insulin: Secondary | ICD-10-CM | POA: Diagnosis not present

## 2023-06-17 DIAGNOSIS — E109 Type 1 diabetes mellitus without complications: Secondary | ICD-10-CM | POA: Diagnosis not present

## 2023-06-21 ENCOUNTER — Ambulatory Visit: Payer: BC Managed Care – PPO | Admitting: Internal Medicine
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
## 2023-07-06 ENCOUNTER — Telehealth: Payer: Self-pay | Admitting: Gastroenterology

## 2023-07-06 NOTE — Telephone Encounter (Signed)
 Pt states that she has tried enemas and lots of miralax  and is passing liquid stool around an impaction that she is not able to pass. Discussed miralax  purge/mag citrate but she reports she has taken a ton of miralax . Let her know she will need to go to the er for disimpaction. Pt states she will do that.

## 2023-07-06 NOTE — Telephone Encounter (Signed)
 Inbound call from patient states she has not been able to pass a bowl movement in the last two weeks.   Patient has taken laxatives, but it has not helped.   Offered next available but patient is requesting to speak with a nurse.   Please advise.

## 2023-07-12 DIAGNOSIS — E109 Type 1 diabetes mellitus without complications: Secondary | ICD-10-CM | POA: Diagnosis not present

## 2023-08-11 ENCOUNTER — Encounter: Payer: Self-pay | Admitting: Emergency Medicine

## 2023-08-11 ENCOUNTER — Other Ambulatory Visit: Payer: Self-pay | Admitting: Radiology

## 2023-08-11 DIAGNOSIS — E7800 Pure hypercholesterolemia, unspecified: Secondary | ICD-10-CM

## 2023-08-11 MED ORDER — ROSUVASTATIN CALCIUM 20 MG PO TABS: 20.0000 mg | ORAL_TABLET | Freq: Every day | ORAL | 1 refills | Status: DC

## 2023-08-23 ENCOUNTER — Encounter: Payer: Self-pay | Admitting: Physician Assistant

## 2023-08-23 ENCOUNTER — Ambulatory Visit: Payer: BC Managed Care – PPO | Admitting: Physician Assistant

## 2023-08-23 DIAGNOSIS — F900 Attention-deficit hyperactivity disorder, predominantly inattentive type: Secondary | ICD-10-CM | POA: Diagnosis not present

## 2023-08-23 DIAGNOSIS — F319 Bipolar disorder, unspecified: Secondary | ICD-10-CM

## 2023-08-23 DIAGNOSIS — F411 Generalized anxiety disorder: Secondary | ICD-10-CM

## 2023-08-23 DIAGNOSIS — G4700 Insomnia, unspecified: Secondary | ICD-10-CM | POA: Diagnosis not present

## 2023-08-23 MED ORDER — QUETIAPINE FUMARATE 400 MG PO TABS: 400.0000 mg | ORAL_TABLET | Freq: Every day | ORAL | 1 refills | Status: DC

## 2023-08-23 MED ORDER — MODAFINIL 200 MG PO TABS: 300.0000 mg | ORAL_TABLET | Freq: Every day | ORAL | 5 refills | Status: DC

## 2023-08-23 MED ORDER — LAMOTRIGINE 150 MG PO TABS: 150.0000 mg | ORAL_TABLET | Freq: Every day | ORAL | 1 refills | Status: DC

## 2023-08-23 NOTE — Progress Notes (Signed)
 [facility] Check  Patient ID: CHAKA BOYSON,  MRN: [id number]  PCP: [facility] Emil Schanz, MD  Date of Evaluation: 08/23/2023 Time spent:20 minutes  Chief Complaint:  Chief Complaint   Follow-up    HISTORY/CURRENT STATUS: For 34-month med check.    Karla Rodriguez is doing well.  Meds are working as intended. She got another promotion at work and is very busy. She has a new granddaughter! 17 weeks old.  She and her wife are thrilled. Patient is able to enjoy things.  Energy and motivation are good.   No extreme sadness, tearfulness, or feelings of hopelessness.  Sleeps well most of the time. ADLs and personal hygiene are normal.   Denies any changes in concentration, making decisions, or remembering things.  Appetite has not changed.  Weight is stable.  Karla Rodriguez has anxiety but is able to work through it  without probs.  Once recently she had to take a Xanax  and it was helpful.  No mania, psychosis, AH/VH, SI/HI.  Denies dizziness, syncope, seizures, numbness, tingling, tremor, tics, unsteady gait, slurred speech, confusion. Denies muscle or joint pain, stiffness, or dystonia.  Her endocrinologist takes care of the known diabetes, and her cholesterol.  Individual Medical History/ Review of Systems: Changes? :No    Past medications for mental health diagnoses include: Xanax , Lexapro , Buspar, Rozerem , Saphris , Lamictal , Wellbutrin, Ambien, Nuvigil, Risperdal, Celexa, Seroquel , Latuda, Strattera, lithium, Trazodone  Allergies: Patient has no known allergies.  Current Medications:  Current Outpatient Medications:    ALPRAZolam  (XANAX ) 0.5 MG tablet, Take 0.5-1 tablets (0.25-0.5 mg total) by mouth 2 (two) times daily as needed for anxiety., Disp: 30 tablet, Rfl: 0   Cholecalciferol (VITAMIN D  PO), Take by mouth., Disp: , Rfl:    Glucagon  3 MG/DOSE POWD, Place 3 mg into the nose once as needed for up to 1 dose., Disp: 1 each, Rfl: 11   ibuprofen  (ADVIL ) 800 MG tablet, Take 1 tablet (800 mg total)  by mouth every 8 (eight) hours as needed., Disp: 15 tablet, Rfl: 0   insulin  lispro (HUMALOG ) 100 UNIT/ML injection, VIA INSULIN  PUMP - 150 UNITS PER DAY, Disp: 140 mL, Rfl: 3   Levomefolic Acid (5-MTHF PO), Take by mouth., Disp: , Rfl:    lisinopril  (ZESTRIL ) 10 MG tablet, TAKE 1 TABLET BY MOUTH EVERY DAY, Disp: 90 tablet, Rfl: 3   meclizine  (ANTIVERT ) 25 MG tablet, Take 1 tablet (25 mg total) by mouth 3 (three) times daily as needed for dizziness., Disp: 30 tablet, Rfl: 0   metaxalone  (SKELAXIN ) 800 MG tablet, Take 1 tablet (800 mg total) by mouth 3 (three) times daily as needed for muscle spasms., Disp: 30 tablet, Rfl: 3   pantoprazole  (PROTONIX ) 40 MG tablet, TAKE 1 TABLET DAILY (NEED OFFICE VISIT FOR REFILLS), Disp: 90 tablet, Rfl: 3   rosuvastatin  (CRESTOR ) 20 MG tablet, Take 1 tablet (20 mg total) by mouth daily., Disp: 90 tablet, Rfl: 1   TURMERIC PO, Take by mouth., Disp: , Rfl:    UNABLE TO FIND, [place], Disp: , Rfl:    lamoTRIgine  (LAMICTAL ) 150 MG tablet, Take 1 tablet (150 mg total) by mouth at bedtime., Disp: 90 tablet, Rfl: 1   metFORMIN  (GLUCOPHAGE ) 1000 MG tablet, Take 1 tablet (1,000 mg total) by mouth daily with supper. (Patient not taking: Reported on 02/23/2023), Disp: 90 tablet, Rfl: 3   modafinil  (PROVIGIL ) 200 MG tablet, Take 1.5 tablets (300 mg total) by mouth daily., Disp: 45 tablet, Rfl: 5   QUEtiapine  (SEROQUEL ) 400 MG  tablet, Take 1 tablet (400 mg total) by mouth at bedtime., Disp: 90 tablet, Rfl: 1 Medication Side Effects: none  Family Medical/ Social History: Changes?     MENTAL HEALTH EXAM:  There were no vitals taken for this visit.There is no height or weight on file to calculate BMI.  General Appearance: Casual and Well Groomed  Eye Contact:  Good  Speech:  Clear and Coherent and Normal Rate  Volume:  Normal  Mood:  Euthymic  Affect:  Congruent  Thought Process:  Goal Directed and Descriptions of Associations: Circumstantial  Orientation:  Full (Time,  Place, and Person)  Thought Content: Logical   Suicidal Thoughts:  No  Homicidal Thoughts:  No  Memory:  WNL  Judgement:  Good  Insight:  Good  Psychomotor Activity:  Normal  Concentration:  Concentration: Good and Attention Span: Good  Recall:  Good  Fund of Knowledge: Good  Language: Good  Assets:  Communication Skills Desire for Improvement Financial Resources/Insurance Housing Resilience Transportation Vocational/Educational  ADL's:  Intact  Cognition: WNL  Prognosis:  Good   Endocrinology follows diabetes and hyperlipidemia.  DIAGNOSES:    ICD-10-CM   1. Bipolar I disorder (HCC)  F31.9     2. GAD (generalized anxiety disorder)  F41.1     3. Attention deficit hyperactivity disorder (ADHD), predominantly inattentive type  F90.0     4. Insomnia, unspecified type  G47.00       Receiving Psychotherapy: Yes   Peggy Hanes  RECOMMENDATIONS:  PDMP was reviewed.  Last modafinil  filled 08/20/2023.   I provided approximately 20 minutes of face to face time during this encounter, including time spent before and after the visit in records review, medical decision making, counseling pertinent to today's visit, and charting.   Congratulations on the birth of her granddaughter!  She is doing well with current medications so no changes will be made.  Continue Xanax  0.5 mg 1/2-1 twice daily as needed.  (She does not have access to the bottle.  Her wife gives her a dose when absolutely necessary.) Continue Lamictal  150 mg, 1 qd. Continue modafinil  200 mg, 1.5 pills daily. Continue Seroquel  400 mg nightly. Continue melatonin 10 mg nightly as needed sleep. Continue therapy. Return in 6 months.    Verneita Cooks, PA-C

## 2023-09-03 DIAGNOSIS — G4733 Obstructive sleep apnea (adult) (pediatric): Secondary | ICD-10-CM | POA: Diagnosis not present

## 2023-09-06 DIAGNOSIS — Z794 Long term (current) use of insulin: Secondary | ICD-10-CM | POA: Diagnosis not present

## 2023-09-06 DIAGNOSIS — E109 Type 1 diabetes mellitus without complications: Secondary | ICD-10-CM | POA: Diagnosis not present

## 2023-09-08 ENCOUNTER — Ambulatory Visit: Admitting: Emergency Medicine

## 2023-09-08 VITALS — BP 116/76 | HR 72 | Temp 98.1°F | Ht 64.0 in | Wt 227.0 lb

## 2023-09-08 DIAGNOSIS — K3184 Gastroparesis: Secondary | ICD-10-CM

## 2023-09-08 DIAGNOSIS — Z23 Encounter for immunization: Secondary | ICD-10-CM

## 2023-09-08 DIAGNOSIS — E1159 Type 2 diabetes mellitus with other circulatory complications: Secondary | ICD-10-CM

## 2023-09-08 DIAGNOSIS — E7800 Pure hypercholesterolemia, unspecified: Secondary | ICD-10-CM

## 2023-09-08 DIAGNOSIS — I152 Hypertension secondary to endocrine disorders: Secondary | ICD-10-CM

## 2023-09-08 DIAGNOSIS — E1059 Type 1 diabetes mellitus with other circulatory complications: Secondary | ICD-10-CM

## 2023-09-08 LAB — CBC WITH DIFFERENTIAL/PLATELET
Basophils Absolute: 0 K/uL (ref 0.0–0.1)
Basophils Relative: 0.3 % (ref 0.0–3.0)
Eosinophils Absolute: 0.1 K/uL (ref 0.0–0.7)
Eosinophils Relative: 1 % (ref 0.0–5.0)
HCT: 42.7 % (ref 36.0–46.0)
Hemoglobin: 14.7 g/dL (ref 12.0–15.0)
Lymphocytes Relative: 21.6 % (ref 12.0–46.0)
Lymphs Abs: 1.6 K/uL (ref 0.7–4.0)
MCHC: 34.5 g/dL (ref 30.0–36.0)
MCV: 87.6 fl (ref 78.0–100.0)
Monocytes Absolute: 0.6 K/uL (ref 0.1–1.0)
Monocytes Relative: 7.8 % (ref 3.0–12.0)
Neutro Abs: 5.2 K/uL (ref 1.4–7.7)
Neutrophils Relative %: 69.3 % (ref 43.0–77.0)
Platelets: 264 K/uL (ref 150.0–400.0)
RBC: 4.87 Mil/uL (ref 3.87–5.11)
RDW: 13.6 % (ref 11.5–15.5)
WBC: 7.6 K/uL (ref 4.0–10.5)

## 2023-09-08 LAB — LIPID PANEL
Cholesterol: 151 mg/dL (ref 0–200)
HDL: 54.2 mg/dL (ref >39.00)
LDL Cholesterol: 63 mg/dL (ref 0–99)
NonHDL: 96.59
Total CHOL/HDL Ratio: 3
Triglycerides: 167 mg/dL — ABNORMAL HIGH (ref 0.0–149.0)
VLDL: 33.4 mg/dL (ref 0.0–40.0)

## 2023-09-08 LAB — COMPREHENSIVE METABOLIC PANEL WITH GFR
ALT: 17 U/L (ref 0–35)
AST: 13 U/L (ref 0–37)
Albumin: 4.8 g/dL (ref 3.5–5.2)
Alkaline Phosphatase: 97 U/L (ref 39–117)
BUN: 11 mg/dL (ref 6–23)
CO2: 30 meq/L (ref 19–32)
Calcium: 9.8 mg/dL (ref 8.4–10.5)
Chloride: 103 meq/L (ref 96–112)
Creatinine, Ser: 0.77 mg/dL (ref 0.40–1.20)
GFR: 86.56 mL/min (ref >60.00)
Glucose, Bld: 207 mg/dL — ABNORMAL HIGH (ref 70–99)
Potassium: 4.4 meq/L (ref 3.5–5.1)
Sodium: 141 meq/L (ref 135–145)
Total Bilirubin: 0.4 mg/dL (ref 0.2–1.2)
Total Protein: 7.3 g/dL (ref 6.0–8.3)

## 2023-09-08 LAB — MICROALBUMIN / CREATININE URINE RATIO
Creatinine,U: 88.1 mg/dL
Microalb Creat Ratio: UNDETERMINED mg/g (ref 0.0–30.0)
Microalb, Ur: <0.7 mg/dL

## 2023-09-08 LAB — HEMOGLOBIN A1C: Hgb A1c MFr Bld: 7.1 % — ABNORMAL HIGH (ref 4.6–6.5)

## 2023-09-08 MED ORDER — LISINOPRIL 10 MG PO TABS: 10.0000 mg | ORAL_TABLET | Freq: Every day | ORAL | 3 refills | Status: AC

## 2023-09-08 MED ORDER — PANTOPRAZOLE SODIUM 40 MG PO TBEC: 40.0000 mg | DELAYED_RELEASE_TABLET | Freq: Every day | ORAL | 3 refills | Status: DC

## 2023-09-08 MED ORDER — ROSUVASTATIN CALCIUM 20 MG PO TABS: 20.0000 mg | ORAL_TABLET | Freq: Every day | ORAL | 3 refills | Status: AC

## 2023-09-08 MED ORDER — MECLIZINE HCL 25 MG PO TABS: 25.0000 mg | ORAL_TABLET | Freq: Three times a day (TID) | ORAL | 3 refills | Status: AC | PRN

## 2023-09-08 NOTE — Assessment & Plan Note (Signed)
 Clinically stable. Not a candidate for GLP-1 agonist due to this condition

## 2023-09-08 NOTE — Patient Instructions (Signed)

## 2023-09-08 NOTE — Assessment & Plan Note (Signed)
 BP Readings from Last 3 Encounters:  09/08/23 116/76  02/21/23 120/70  10/22/22 128/80  Well-controlled hypertension Continue lisinopril  10 mg daily Well-controlled diabetes.  On insulin  pump.  Follows up with endocrinologist.  Medications handled by their office. Cardiovascular risks associated with hypertension and diabetes discussed Continues rosuvastatin  20 mg daily
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
## 2023-09-08 NOTE — Assessment & Plan Note (Signed)
 Chronic stable condition Continues rosuvastatin  20 mg daily The 10-year ASCVD risk score (Arnett DK, et al., 2019) is: 3.1%   Values used to calculate the score:     Age: 58 years     Clincally relevant sex: Female     Is Non-Hispanic African American: No     Diabetic: Yes     Tobacco smoker: No     Systolic Blood Pressure: 116 mmHg     Is BP treated: Yes     HDL Cholesterol: 50 mg/dL     Total Cholesterol: 138 mg/dL

## 2023-09-09 ENCOUNTER — Ambulatory Visit: Payer: Self-pay | Admitting: Emergency Medicine

## 2023-10-04 ENCOUNTER — Encounter: Payer: Self-pay | Admitting: Emergency Medicine

## 2023-10-04 ENCOUNTER — Other Ambulatory Visit: Payer: Self-pay | Admitting: Radiology

## 2023-10-04 DIAGNOSIS — G4733 Obstructive sleep apnea (adult) (pediatric): Secondary | ICD-10-CM

## 2023-10-05 DIAGNOSIS — E109 Type 1 diabetes mellitus without complications: Secondary | ICD-10-CM | POA: Diagnosis not present

## 2023-10-06 ENCOUNTER — Encounter: Payer: Self-pay | Admitting: Internal Medicine

## 2023-10-06 ENCOUNTER — Ambulatory Visit (INDEPENDENT_AMBULATORY_CARE_PROVIDER_SITE_OTHER): Admitting: Internal Medicine

## 2023-10-06 VITALS — BP 126/62 | HR 65 | Resp 20 | Ht 64.0 in | Wt 227.0 lb

## 2023-10-06 DIAGNOSIS — R7989 Other specified abnormal findings of blood chemistry: Secondary | ICD-10-CM | POA: Diagnosis not present

## 2023-10-06 DIAGNOSIS — E1010 Type 1 diabetes mellitus with ketoacidosis without coma: Secondary | ICD-10-CM

## 2023-10-06 DIAGNOSIS — E7800 Pure hypercholesterolemia, unspecified: Secondary | ICD-10-CM | POA: Diagnosis not present

## 2023-10-06 NOTE — Patient Instructions (Addendum)
 Please the following pump settings: - basal rates: 12 am: 2.50  3:30 am: 2.70 7:30 am: 2.5 11 am: 2.45 1 pm: 1.80 3 pm: 1.75 - ICR:   12 am: 3.2   7 am: 3.6 >> 3.2  11 am: 3.5 >> 3.2  3:30 pm: 3.5 >> 3.2 4:30 pm: 3.5 >> 3.2  5 pm: 3.4 >> 3.2 - target: 125-140 >> 110-120 - ISF:  12 am: 20 11 am: 30 3 pm: 20 - Insulin  on Board: 2:30 h - bolus wizard: on  Continue: - Metformin  1000 mg daily with dinner  Please return in 6 months.
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
## 2023-11-03 ENCOUNTER — Encounter: Payer: Self-pay | Admitting: *Deleted

## 2023-11-07 ENCOUNTER — Encounter: Payer: Self-pay | Admitting: Neurology

## 2023-11-07 ENCOUNTER — Ambulatory Visit (INDEPENDENT_AMBULATORY_CARE_PROVIDER_SITE_OTHER): Admitting: Neurology

## 2023-11-07 ENCOUNTER — Other Ambulatory Visit: Payer: Self-pay | Admitting: Internal Medicine

## 2023-11-07 VITALS — BP 132/75 | HR 92 | Ht 64.0 in | Wt 225.0 lb

## 2023-11-07 DIAGNOSIS — E669 Obesity, unspecified: Secondary | ICD-10-CM

## 2023-11-07 DIAGNOSIS — G4733 Obstructive sleep apnea (adult) (pediatric): Secondary | ICD-10-CM | POA: Diagnosis not present

## 2023-11-07 DIAGNOSIS — R635 Abnormal weight gain: Secondary | ICD-10-CM

## 2023-11-07 DIAGNOSIS — E1010 Type 1 diabetes mellitus with ketoacidosis without coma: Secondary | ICD-10-CM

## 2023-11-07 NOTE — Patient Instructions (Signed)
 It was nice to see you again today.  As discussed, I will order a home sleep test to reevaluate your sleep apnea as testing was over 15 years ago.   The home sleep test is done (HST) to re-establish/confirm the sleep apnea diagnosis and to get your a new machine through the insurance. Our sleep lab staff will reach out to you to arrange for pickup and for tutorial of the test equipment. I will write for a new machine after the HST confirms the OSA (obstructive sleep apnea) diagnosis. Please remember, you will not use your CPAP machine during the night of testing so we get diagnostic data, not treatment data.  You can resume treatment after the test is completed and you may be able to take a nap with your CPAP in place after your completion of the study.   We will schedule a follow-up appointment after the new machine set-up date, typically within 31 to 89 days post treatment start.  Per insurance requirement, you will need to show compliance with usage and fulfill a minimum usage percentage.

## 2023-11-07 NOTE — Telephone Encounter (Signed)
 Refill request complete
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
## 2023-11-16 ENCOUNTER — Other Ambulatory Visit: Payer: Self-pay | Admitting: Physician Assistant

## 2023-11-16 NOTE — Telephone Encounter (Signed)
Dose needs clarified

## 2023-11-21 NOTE — Telephone Encounter (Signed)
LVM to RC to verify dose

## 2023-11-22 NOTE — Telephone Encounter (Signed)
 Pt said she doesn't need a RF currently.

## 2023-11-24 ENCOUNTER — Encounter: Payer: Self-pay | Admitting: Neurology

## 2023-11-30 ENCOUNTER — Ambulatory Visit: Admitting: Neurology

## 2023-11-30 DIAGNOSIS — G4733 Obstructive sleep apnea (adult) (pediatric): Secondary | ICD-10-CM | POA: Diagnosis not present

## 2023-11-30 DIAGNOSIS — R635 Abnormal weight gain: Secondary | ICD-10-CM

## 2023-11-30 DIAGNOSIS — E669 Obesity, unspecified: Secondary | ICD-10-CM

## 2023-12-01 NOTE — Progress Notes (Unsigned)
 Karla Rodriguez

## 2023-12-02 ENCOUNTER — Ambulatory Visit: Payer: Self-pay | Admitting: Neurology

## 2023-12-02 DIAGNOSIS — G4733 Obstructive sleep apnea (adult) (pediatric): Secondary | ICD-10-CM

## 2023-12-02 NOTE — Procedures (Signed)
 [facility]  HOME SLEEP TEST (Watch PAT) REPORT  STUDY DATE: 11/30/2023  DOB: Oct 22, 1965  MRN: [id number]  ORDERING CLINICIAN: True Mar, MD, PhD   REFERRING CLINICIAN: Purcell Emil Schanz, MD   CLINICAL INFORMATION/HISTORY (obtained from visit note dated 11/07/2023): 58 year old female with an underlying complex medical history of ADHD, mood disorder, allergic rhinitis, anemia, asthma, reflux disease, gastroparesis, irritable bowel syndrome, hypertension, osteoarthritis, palpitations, hyperlipidemia, type 1 diabetes, alcohol use disorder and prior smoking (none since 2010), and obesity, who was previously diagnosed with obstructive sleep apnea and placed on PAP therapy.  She has been compliant with her CPAP of 10 cm with good apnea control.  Her machine is over 67 years old and has shown an error message.  She is eligible for new equipment.  Epworth sleepiness score: 3/24.  BMI: 38.6 kg/m  FINDINGS:   Sleep Summary:   Total Recording Time (hours, min): 8 hours, 7 min  Total Sleep Time (hours, min):  6 hours, 23 min  Percent REM (%):    11.1%   Respiratory Indices:   Calculated pAHI (per hour):  40.8/hour         REM pAHI:    69.3/hour       NREM pAHI: 37.2/hour  Central pAHI: 0/hour  Oxygen Saturation Statistics:    Oxygen Saturation (%) Mean: 92%   Minimum oxygen saturation (%):                 84%   O2 Saturation Range (%): 84-97%    O2 Saturation (minutes) <=88%: 1.5 min  Pulse Rate Statistics:   Pulse Mean (bpm):    76/min    Pulse Range (58-103/min)   IMPRESSION: OSA (obstructive sleep apnea), severe  RECOMMENDATION:  This home sleep test demonstrates severe obstructive sleep apnea with a total AHI of 40.8/hour and O2 nadir of 84% with loud snoring noted fairly consistently throughout the study.  Ongoing treatment with positive airway pressure is highly recommended.  This patient has been compliant with her CPAP of 10 cm with  good tolerance and good apnea control.  She should be eligible for a new machine.  I recommend keeping her settings the same, mask of choice, sized to fit.  Ongoing full compliance should be reinforced and the patient will be encouraged to continue to work on weight loss as it may help reduce the severity of her sleep disordered breathing over time.   A laboratory attended titration study can be considered in the future for optimization of treatment settings and to improve tolerance and compliance, if needed, down the road. Alternative treatment options are limited secondary to the severity of the patient's sleep disordered breathing, but may include surgical treatment with an implantable hypoglossal nerve stimulator (in carefully selected candidates, meeting criteria).  Again, concomitant weight loss is recommended (where clinically appropriate). Please note, that untreated obstructive sleep apnea may carry additional perioperative morbidity. Patients with significant obstructive sleep apnea should receive perioperative PAP therapy and the surgeons and particularly the anesthesiologist should be informed of the diagnosis and the severity of the sleep disordered breathing. The patient should be cautioned not to drive, work at heights, or operate dangerous or heavy equipment when tired or sleepy. Review and reiteration of good sleep hygiene measures should be pursued with any patient. Other causes of the patient's symptoms, including circadian rhythm disturbances, an underlying mood disorder, medication effect and/or an underlying medical problem cannot be ruled out based on this test. Clinical correlation is  recommended.  The patient and her referring provider will be notified of the test results. The patient will be seen in follow up in sleep clinic at [facility].  I certify that I have reviewed the raw data recording prior to the issuance of this report in accordance with the standards of the American Academy of  Sleep Medicine (AASM).    INTERPRETING PHYSICIAN:   True Mar, MD, PhD Medical Director, [facility]) Diplomat, ABPN (Neurology and Sleep)   [facility] [address], [address] [place], [place] [address] [phone]

## 2023-12-05 DIAGNOSIS — G4733 Obstructive sleep apnea (adult) (pediatric): Secondary | ICD-10-CM | POA: Diagnosis not present

## 2023-12-05 NOTE — Telephone Encounter (Signed)
-----   Message from True Mar sent at 12/02/2023  3:19 PM EDT ----- Home sleep test confirms her prior diagnosis of obstructive sleep apnea, she is eligible for a new machine.  Please advise patient that I have placed an order for a new CPAP machine, we can keep her  settings the same but if she would like a different mask, she can talk to her DME provider about a mask refit at the time of picking up her new machine.  Please encourage full compliance with  treatment as she has severe obstructive sleep apnea.  Please schedule follow-up appointment with one of our nurse practitioners or myself within 30 to 90 days of set up date. ----- Message ----- From: Mar True, MD Sent: 12/02/2023   3:07 PM EDT To: True Mar, MD

## 2023-12-05 NOTE — Telephone Encounter (Signed)
 Lvm 1st attempt 12/05/23

## 2023-12-06 ENCOUNTER — Encounter: Payer: Self-pay | Admitting: Neurology

## 2023-12-06 NOTE — Telephone Encounter (Signed)
 Sent message to [employer] via [employer].

## 2023-12-06 NOTE — Telephone Encounter (Signed)
 RE: new machine for severe OSA Received: Today [place], Roma Neysa Nena GORMAN, RN; Dahlen, Mazurika; Rumalda Raring; Nickola Stabs; Able, Jada Good morning  Pulled cpap order will have processed.  Thank you!     Previous Messages    ----- Message ----- From: Neysa Nena GORMAN, RN Sent: 12/06/2023  10:15 AM EDT To: Raring Rumalda; Mazurika Massenburg; Stabs Bunting* Subject: new machine for severe OSA                    New order in EPIC for pt for new machine. (May need mask fit as well).    Karla Rodriguez, 58 y.o., February 14, 1965 MRN: [id number] Phone: [phone]    [facility]

## 2023-12-31 DIAGNOSIS — E109 Type 1 diabetes mellitus without complications: Secondary | ICD-10-CM | POA: Diagnosis not present

## 2024-02-07 ENCOUNTER — Other Ambulatory Visit: Payer: Self-pay | Admitting: Emergency Medicine

## 2024-02-07 DIAGNOSIS — G4733 Obstructive sleep apnea (adult) (pediatric): Secondary | ICD-10-CM | POA: Diagnosis not present

## 2024-02-23 ENCOUNTER — Encounter: Payer: Self-pay | Admitting: Physician Assistant

## 2024-02-23 ENCOUNTER — Telehealth: Admitting: Physician Assistant

## 2024-02-23 DIAGNOSIS — F900 Attention-deficit hyperactivity disorder, predominantly inattentive type: Secondary | ICD-10-CM

## 2024-02-23 DIAGNOSIS — F319 Bipolar disorder, unspecified: Secondary | ICD-10-CM | POA: Diagnosis not present

## 2024-02-23 DIAGNOSIS — F411 Generalized anxiety disorder: Secondary | ICD-10-CM | POA: Diagnosis not present

## 2024-02-23 MED ORDER — QUETIAPINE FUMARATE 400 MG PO TABS: 400.0000 mg | ORAL_TABLET | Freq: Every day | ORAL | 1 refills | Status: AC

## 2024-02-23 MED ORDER — LAMOTRIGINE 150 MG PO TABS: 150.0000 mg | ORAL_TABLET | Freq: Every day | ORAL | 1 refills | Status: AC

## 2024-02-23 MED ORDER — MODAFINIL 200 MG PO TABS: 300.0000 mg | ORAL_TABLET | Freq: Every day | ORAL | 5 refills | Status: AC

## 2024-02-23 NOTE — Progress Notes (Signed)
 "     [facility] Check  Patient ID: Karla Rodriguez,  MRN: [id number]  PCP: Purcell Emil Schanz, MD  Date of Evaluation: 02/23/2024 Time spent:20 minutes  Chief Complaint:  Chief Complaint   Follow-up    Virtual Visit via Telehealth  I connected with patient by a video enabled telemedicine application with their informed consent, and verified patient privacy and that I am speaking with the correct person using two identifiers.  I am private, in my office and the patient is at home.  I discussed the limitations, risks, security and privacy concerns of performing an evaluation and management service by video and the availability of in person appointments. I also discussed with the patient that there may be a patient responsible charge related to this service. The patient expressed understanding and agreed to proceed.   I discussed the assessment and treatment plan with the patient. The patient was provided an opportunity to ask questions and all were answered. The patient agreed with the plan and demonstrated an understanding of the instructions.   The patient was advised to call back or seek an in-person evaluation if the symptoms worsen or if the condition fails to improve as anticipated.  I provided approximately 20  minutes of non-face-to-face time during this encounter.  HISTORY/CURRENT STATUS: For 79-month med check.    Karla Rodriguez continues to do well.  Her medications are working as intended.  She is able to enjoy things.  Work is going okay although it can be stressful at times.  Hopes to retire soon. Appetite is normal and weight is stable.  ADLs and personal hygiene are normal.  No reports of sadness or feelings of hopelessness.  She sleeps well most of the time.  No decline in memory or issues with focus.  She does get anxious at times and has the Xanax  on hand if needed.  It is not very often that she has to take it.  No suicidal or homicidal thoughts.  Patient denies increased  energy with decreased need for sleep, increased talkativeness, racing thoughts, impulsivity or risky behaviors, increased spending, increased libido, grandiosity, increased irritability or anger, paranoia, or hallucinations.  Individual Medical History/ Review of Systems: Changes? :No    Past medications for mental health diagnoses include: Xanax , Lexapro , Buspar, Rozerem , Saphris , Lamictal , Wellbutrin, Ambien, Nuvigil, Risperdal, Celexa, Seroquel , Latuda, Strattera, lithium, Trazodone  Allergies: Patient has no known allergies.  Current Medications:  Current Outpatient Medications:    ALPRAZolam  (XANAX ) 0.5 MG tablet, Take 0.5-1 tablets (0.25-0.5 mg total) by mouth 2 (two) times daily as needed for anxiety., Disp: 30 tablet, Rfl: 0   Cholecalciferol (VITAMIN D  PO), Take by mouth., Disp: , Rfl:    Glucagon  3 MG/DOSE POWD, Place 3 mg into the nose once as needed for up to 1 dose., Disp: 1 each, Rfl: 11   ibuprofen  (ADVIL ) 800 MG tablet, Take 1 tablet (800 mg total) by mouth every 8 (eight) hours as needed., Disp: 15 tablet, Rfl: 0   insulin  lispro (HUMALOG ) 100 UNIT/ML injection, USE VIA INSULIN  PUMP: 150 UNITS PER DAY, Disp: 140 mL, Rfl: 3   Levomefolic Acid (5-MTHF PO), Take by mouth., Disp: , Rfl:    lisinopril  (ZESTRIL ) 10 MG tablet, Take 1 tablet (10 mg total) by mouth daily., Disp: 90 tablet, Rfl: 3   meclizine  (ANTIVERT ) 25 MG tablet, Take 1 tablet (25 mg total) by mouth 3 (three) times daily as needed for dizziness., Disp: 30 tablet, Rfl: 3   metaxalone  (SKELAXIN )  800 MG tablet, Take 1 tablet (800 mg total) by mouth 3 (three) times daily as needed for muscle spasms., Disp: 30 tablet, Rfl: 3   pantoprazole  (PROTONIX ) 40 MG tablet, TAKE 1 TABLET DAILY (NEED OFFICE VISIT FOR REFILLS), Disp: 90 tablet, Rfl: 3   rosuvastatin  (CRESTOR ) 20 MG tablet, Take 1 tablet (20 mg total) by mouth daily., Disp: 90 tablet, Rfl: 3   TURMERIC PO, Take by mouth., Disp: , Rfl:    lamoTRIgine  (LAMICTAL ) 150 MG  tablet, Take 1 tablet (150 mg total) by mouth at bedtime., Disp: 90 tablet, Rfl: 1   metFORMIN  (GLUCOPHAGE ) 1000 MG tablet, Take 1 tablet (1,000 mg total) by mouth daily with supper. (Patient not taking: Reported on 02/23/2024), Disp: 90 tablet, Rfl: 3   modafinil  (PROVIGIL ) 200 MG tablet, Take 1.5 tablets (300 mg total) by mouth daily., Disp: 45 tablet, Rfl: 5   QUEtiapine  (SEROQUEL ) 400 MG tablet, Take 1 tablet (400 mg total) by mouth at bedtime., Disp: 90 tablet, Rfl: 1   UNABLE TO FIND, [address], Disp: , Rfl:  Medication Side Effects: none  Family Medical/ Social History: Changes?     MENTAL HEALTH EXAM:  There were no vitals taken for this visit.There is no height or weight on file to calculate BMI.  General Appearance: Casual and Well Groomed  Eye Contact:  Good  Speech:  Clear and Coherent and Normal Rate  Volume:  Normal  Mood:  Euthymic  Affect:  Congruent  Thought Process:  Goal Directed and Descriptions of Associations: Circumstantial  Orientation:  Full (Time, Place, and Person)  Thought Content: Logical   Suicidal Thoughts:  No  Homicidal Thoughts:  No  Memory:  WNL  Judgement:  Good  Insight:  Good  Psychomotor Activity:  Normal  Concentration:  Concentration: Good and Attention Span: Good  Recall:  Good  Fund of Knowledge: Good  Language: Good  Assets:  Communication Skills Desire for Improvement Financial Resources/Insurance Housing Resilience Social Support Transportation Vocational/Educational  ADL's:  Intact  Cognition: WNL  Prognosis:  Good   Endocrinology follows diabetes and hyperlipidemia.  DIAGNOSES:    ICD-10-CM   1. Bipolar I disorder (HCC)  F31.9     2. GAD (generalized anxiety disorder)  F41.1     3. Attention deficit hyperactivity disorder (ADHD), predominantly inattentive type  F90.0       Receiving Psychotherapy: Yes   Peggy Hanes  RECOMMENDATIONS:  PDMP was reviewed.  Last modafinil  filled 02/15/2024. I provided approximately  20  minutes of non-face-to-face time during this encounter, including time spent before and after the visit in records review, medical decision making, counseling pertinent to today's visit, and charting.   Karla Rodriguez is doing well on the current treatment so no changes are needed.   Continue Xanax  0.5 mg 1/2-1 twice daily as needed.  (She does not have access to the bottle.  Her wife gives her a dose when absolutely necessary.) Continue Lamictal  150 mg, 1 qd. Continue modafinil  200 mg, 1.5 pills daily. Continue Seroquel  400 mg nightly. Continue melatonin 10 mg nightly as needed sleep. Continue therapy. Return in 6 months.    Verneita Cooks, PA-C  "

## 2024-04-12 ENCOUNTER — Ambulatory Visit: Admitting: Internal Medicine
